# Patient Record
Sex: Female | Born: 1970 | Race: White | Hispanic: No | Smoking: Never smoker
Health system: Southern US, Community
[De-identification: ages and names within clinical notes are randomized; demographics above are authoritative.]

## PROBLEM LIST (undated history)

## (undated) DIAGNOSIS — N200 Calculus of kidney: Secondary | ICD-10-CM

## (undated) DIAGNOSIS — Z9889 Other specified postprocedural states: Secondary | ICD-10-CM

## (undated) DIAGNOSIS — T8859XA Other complications of anesthesia, initial encounter: Secondary | ICD-10-CM

## (undated) DIAGNOSIS — T4145XA Adverse effect of unspecified anesthetic, initial encounter: Secondary | ICD-10-CM

## (undated) DIAGNOSIS — R112 Nausea with vomiting, unspecified: Secondary | ICD-10-CM

## (undated) HISTORY — PX: DENTAL SURGERY: SHX609

---

## 1994-03-02 HISTORY — PX: TUBAL LIGATION: SHX77

## 2002-03-02 HISTORY — PX: OTHER SURGICAL HISTORY: SHX169

## 2015-04-08 ENCOUNTER — Ambulatory Visit
Admission: RE | Admit: 2015-04-08 | Discharge: 2015-04-08 | Disposition: A | Discharge status: Home / Self Care | Payer: Managed Care, Other (non HMO) | Source: Ambulatory Visit | Attending: Cardiology | Admitting: Cardiology

## 2015-04-08 ENCOUNTER — Other Ambulatory Visit: Payer: Self-pay | Admitting: Internal Medicine

## 2015-04-08 ENCOUNTER — Ambulatory Visit
Admission: RE | Admit: 2015-04-08 | Discharge: 2015-04-08 | Disposition: A | Discharge status: Home / Self Care | Payer: Managed Care, Other (non HMO) | Source: Ambulatory Visit | Attending: Internal Medicine | Admitting: Internal Medicine

## 2015-04-08 DIAGNOSIS — R05 Cough: Secondary | ICD-10-CM

## 2015-04-08 DIAGNOSIS — J40 Bronchitis, not specified as acute or chronic: Secondary | ICD-10-CM

## 2015-04-08 DIAGNOSIS — R059 Cough, unspecified: Secondary | ICD-10-CM

## 2015-05-16 ENCOUNTER — Encounter: Payer: Self-pay | Admitting: Family Medicine

## 2015-05-16 ENCOUNTER — Ambulatory Visit (INDEPENDENT_AMBULATORY_CARE_PROVIDER_SITE_OTHER): Payer: Managed Care, Other (non HMO) | Admitting: Family Medicine

## 2015-05-16 VITALS — BP 122/72 | HR 74 | Temp 98.4°F | Ht 63.5 in | Wt 170.6 lb

## 2015-05-16 DIAGNOSIS — Z23 Encounter for immunization: Secondary | ICD-10-CM

## 2015-05-16 DIAGNOSIS — Z0000 Encounter for general adult medical examination without abnormal findings: Secondary | ICD-10-CM | POA: Diagnosis not present

## 2015-05-16 NOTE — Progress Notes (Signed)
BP 122/72 mmHg  Pulse 74  Temp(Src) 98.4 F (36.9 C)  Ht 5' 3.5" (1.613 m)  Wt 170 lb 9.6 oz (77.384 kg)  BMI 29.74 kg/m2  SpO2 99%  LMP 04/23/2015 (Exact Date)   Subjective:    Patient ID: Candice Robinson, female    DOB: 09/19/1970, 45 y.o.   MRN: [id number]  HPI: Candice Robinson is a 45 y.o. female presenting on 05/16/2015 to establish care and for comprehensive medical examination. Current medical complaints include: none  She currently lives with: husband Menopausal Symptoms: no  Depression Screen done today and results listed below:  Depression screen [facility] 2/9 05/16/2015  Decreased Interest 0  Down, Depressed, Hopeless 0  PHQ - 2 Score 0    Past Medical History:  History reviewed. No pertinent past medical history.  Surgical History:  Past Surgical History  Procedure Laterality Date  . Tubal ligation  1996  . Tummy tuck  2004    Medications:  No current outpatient prescriptions on file prior to visit.   No current facility-administered medications on file prior to visit.    Allergies:  Allergies  Allergen Reactions  . Codeine Other (See Comments)    Hallucinations    Social History:  Social History   Social History  . Marital Status: Single    Spouse Name: N/A  . Number of Children: N/A  . Years of Education: N/A   Occupational History  . Not on file.   Social History Main Topics  . Smoking status: Never Smoker   . Smokeless tobacco: Never Used  . Alcohol Use: Yes     Comment: socially  . Drug Use: No  . Sexual Activity: Yes   Other Topics Concern  . Not on file   Social History Narrative  . No narrative on file   History  Smoking status  . Never Smoker   Smokeless tobacco  . Never Used   History  Alcohol Use  . Yes    Comment: socially    Family History:  Family History  Problem Relation Age of Onset  . Diabetes Mother   . Hyperlipidemia Mother   . Hypertension Mother   . Hyperlipidemia Father   . Hypertension Father    . Heart disease Father   . Lupus Sister   . Diabetes Sister   . Hypertension Sister     Past medical history, surgical history, medications, allergies, family history and social history reviewed with patient today and changes made to appropriate areas of the chart.   Review of Systems  Constitutional: Negative.   HENT: Negative.   Eyes: Negative.        Needs to get eyes checked  Respiratory: Negative.   Cardiovascular: Positive for leg swelling (only with being on them for a long time). Negative for chest pain, palpitations, orthopnea, claudication and PND.  Gastrointestinal: Positive for heartburn (goes away with OTC medicine, food dependent). Negative for nausea, vomiting, abdominal pain, diarrhea, constipation, blood in stool and melena.  Genitourinary: Negative.        Periods are pretty irregular  Musculoskeletal: Negative.   Skin: Negative.   Neurological: Negative.   Endo/Heme/Allergies: Negative.   Psychiatric/Behavioral: Negative.        Some mood swings around her period     All other ROS negative except what is listed above and in the HPI.      Objective:    BP 122/72 mmHg  Pulse 74  Temp(Src) 98.4 F (36.9 C)  Ht  5' 3.5" (1.613 m)  Wt 170 lb 9.6 oz (77.384 kg)  BMI 29.74 kg/m2  SpO2 99%  LMP 04/23/2015 (Exact Date)  Wt Readings from Last 3 Encounters:  05/16/15 170 lb 9.6 oz (77.384 kg)    Physical Exam  Constitutional: She is oriented to person, place, and time. She appears well-developed and well-nourished. No distress.  HENT:  Head: Normocephalic and atraumatic.  Right Ear: Hearing, tympanic membrane, external ear and ear canal normal.  Left Ear: Hearing, tympanic membrane, external ear and ear canal normal.  Nose: Nose normal.  Mouth/Throat: Uvula is midline, oropharynx is clear and moist and mucous membranes are normal. No oropharyngeal exudate.  Eyes: Conjunctivae, EOM and lids are normal. Pupils are equal, round, and reactive to light. Right  eye exhibits no discharge. Left eye exhibits no discharge. No scleral icterus.  Neck: Normal range of motion. Neck supple. No JVD present. No tracheal deviation present. No thyromegaly present.  Cardiovascular: Normal rate, regular rhythm, normal heart sounds and intact distal pulses.  Exam reveals no gallop and no friction rub.   No murmur heard. Pulmonary/Chest: Effort normal and breath sounds normal. No stridor. No respiratory distress. She has no wheezes. She has no rales. She exhibits no tenderness.  Abdominal: Soft. Bowel sounds are normal. She exhibits no distension and no mass. There is no tenderness. There is no rebound and no guarding.  Genitourinary:  Breast and Pelvic deferred- done at GYN   Musculoskeletal: Normal range of motion. She exhibits no edema or tenderness.  Lymphadenopathy:    She has no cervical adenopathy.  Neurological: She is alert and oriented to person, place, and time. She has normal reflexes. She displays normal reflexes. No cranial nerve deficit. She exhibits normal muscle tone. Coordination normal.  Skin: Skin is warm, dry and intact. No rash noted. She is not diaphoretic. No erythema. No pallor.  Psychiatric: She has a normal mood and affect. Her speech is normal and behavior is normal. Judgment and thought content normal. Cognition and memory are normal.  Nursing note and vitals reviewed.   No results found for this or any previous visit.    Assessment & Plan:   Problem List Items Addressed This Visit    None    Visit Diagnoses    Routine general medical examination at a health care facility    -  Primary    Up to date on vaccines, pap due- sees GYN. Screening labs checked today. Work on diet and exercise. Continue to montior.     Relevant Orders    CBC with Differential/Platelet    Comprehensive metabolic panel    Lipid Panel w/o Chol/HDL Ratio    TSH    UA/M w/rflx Culture, Routine    Immunization due        Tdap given today.    Relevant  Orders    Tdap vaccine greater than or equal to 7yo IM (Completed)        Follow up plan: Return in about 1 year (around 05/15/2016) for Physical, [facility]   LABORATORY TESTING:  - Pap smear: done elsewhere  IMMUNIZATIONS:   - Tdap: Tetanus vaccination status reviewed: Given today. - Influenza: Refused - Pneumovax: Not applicable  PATIENT COUNSELING:   Advised to take 1 mg of folate supplement per day if capable of pregnancy.   Sexuality: Discussed sexually transmitted diseases, partner selection, use of condoms, avoidance of unintended pregnancy  and contraceptive alternatives.   Advised to avoid  cigarette smoking.  I discussed with the patient that most people either abstain from alcohol or drink within safe limits (<=14/week and <=4 drinks/occasion for males, <=7/weeks and <= 3 drinks/occasion for females) and that the risk for alcohol disorders and other health effects rises proportionally with the number of drinks per week and how often a drinker exceeds daily limits.  Discussed cessation/primary prevention of drug use and availability of treatment for abuse.   Diet: Encouraged to adjust caloric intake to maintain  or achieve ideal body weight, to reduce intake of dietary saturated fat and total fat, to limit sodium intake by avoiding high sodium foods and not adding table salt, and to maintain adequate dietary potassium and calcium preferably from fresh fruits, vegetables, and low-fat dairy products.    stressed the importance of regular exercise  Injury prevention: Discussed safety belts, safety helmets, smoke detector, smoking near bedding or upholstery.   Dental health: Discussed importance of regular tooth brushing, flossing, and dental visits.    NEXT PREVENTATIVE PHYSICAL DUE IN 1 YEAR. Return in about 1 year (around 05/15/2016) for Physical, records release for [place] [facility] please.

## 2015-05-16 NOTE — Patient Instructions (Addendum)
Health Maintenance, Female Adopting a healthy lifestyle and getting preventive care can go a long way to promote health and wellness. Talk with your health care provider about what schedule of regular examinations is right for you. This is a good chance for you to check in with your provider about disease prevention and staying healthy. In between checkups, there are plenty of things you can do on your own. Experts have done a lot of research about which lifestyle changes and preventive measures are most likely to keep you healthy. Ask your health care provider for more information. WEIGHT AND DIET  Eat a healthy diet  Be sure to include plenty of vegetables, fruits, low-fat dairy products, and lean protein.  Do not eat a lot of foods high in solid fats, added sugars, or salt.  Get regular exercise. This is one of the most important things you can do for your health.  Most adults should exercise for at least 150 minutes each week. The exercise should increase your heart rate and make you sweat (moderate-intensity exercise).  Most adults should also do strengthening exercises at least twice a week. This is in addition to the moderate-intensity exercise.  Maintain a healthy weight  Body mass index (BMI) is a measurement that can be used to identify possible weight problems. It estimates body fat based on height and weight. Your health care provider can help determine your BMI and help you achieve or maintain a healthy weight.  For females 20 years of age and older:   A BMI below 18.5 is considered underweight.  A BMI of 18.5 to 24.9 is normal.  A BMI of 25 to 29.9 is considered overweight.  A BMI of 30 and above is considered obese.  Watch levels of cholesterol and blood lipids  You should start having your blood tested for lipids and cholesterol at 45 years of age, then have this test every 5 years.  You may need to have your cholesterol levels checked more often if:  Your lipid  or cholesterol levels are high.  You are older than 45 years of age.  You are at high risk for heart disease.  CANCER SCREENING   Lung Cancer  Lung cancer screening is recommended for adults 55-80 years old who are at high risk for lung cancer because of a history of smoking.  A yearly low-dose CT scan of the lungs is recommended for people who:  Currently smoke.  Have quit within the past 15 years.  Have at least a 30-pack-year history of smoking. A pack year is smoking an average of one pack of cigarettes a day for 1 year.  Yearly screening should continue until it has been 15 years since you quit.  Yearly screening should stop if you develop a health problem that would prevent you from having lung cancer treatment.  Breast Cancer  Practice breast self-awareness. This means understanding how your breasts normally appear and feel.  It also means doing regular breast self-exams. Let your health care provider know about any changes, no matter how small.  If you are in your 20s or 30s, you should have a clinical breast exam (CBE) by a health care provider every 1-3 years as part of a regular health exam.  If you are 40 or older, have a CBE every year. Also consider having a breast X-ray (mammogram) every year.  If you have a family history of breast cancer, talk to your health care provider about genetic screening.  If you   are at high risk for breast cancer, talk to your health care provider about having an MRI and a mammogram every year.  Breast cancer gene (BRCA) assessment is recommended for women who have family members with BRCA-related cancers. BRCA-related cancers include:  Breast.  Ovarian.  Tubal.  Peritoneal cancers.  Results of the assessment will determine the need for genetic counseling and BRCA1 and BRCA2 testing. Cervical Cancer Your health care provider may recommend that you be screened regularly for cancer of the pelvic organs (ovaries, uterus, and  vagina). This screening involves a pelvic examination, including checking for microscopic changes to the surface of your cervix (Pap test). You may be encouraged to have this screening done every 3 years, beginning at age 21.  For women ages 30-65, health care providers may recommend pelvic exams and Pap testing every 3 years, or they may recommend the Pap and pelvic exam, combined with testing for human papilloma virus (HPV), every 5 years. Some types of HPV increase your risk of cervical cancer. Testing for HPV may also be done on women of any age with unclear Pap test results.  Other health care providers may not recommend any screening for nonpregnant women who are considered low risk for pelvic cancer and who do not have symptoms. Ask your health care provider if a screening pelvic exam is right for you.  If you have had past treatment for cervical cancer or a condition that could lead to cancer, you need Pap tests and screening for cancer for at least 20 years after your treatment. If Pap tests have been discontinued, your risk factors (such as having a new sexual partner) need to be reassessed to determine if screening should resume. Some women have medical problems that increase the chance of getting cervical cancer. In these cases, your health care provider may recommend more frequent screening and Pap tests. Colorectal Cancer  This type of cancer can be detected and often prevented.  Routine colorectal cancer screening usually begins at 45 years of age and continues through 45 years of age.  Your health care provider may recommend screening at an earlier age if you have risk factors for colon cancer.  Your health care provider may also recommend using home test kits to check for hidden blood in the stool.  A small camera at the end of a tube can be used to examine your colon directly (sigmoidoscopy or colonoscopy). This is done to check for the earliest forms of colorectal  cancer.  Routine screening usually begins at age 50.  Direct examination of the colon should be repeated every 5-10 years through 45 years of age. However, you may need to be screened more often if early forms of precancerous polyps or small growths are found. Skin Cancer  Check your skin from head to toe regularly.  Tell your health care provider about any new moles or changes in moles, especially if there is a change in a mole's shape or color.  Also tell your health care provider if you have a mole that is larger than the size of a pencil eraser.  Always use sunscreen. Apply sunscreen liberally and repeatedly throughout the day.  Protect yourself by wearing long sleeves, pants, a wide-brimmed hat, and sunglasses whenever you are outside. HEART DISEASE, DIABETES, AND HIGH BLOOD PRESSURE   High blood pressure causes heart disease and increases the risk of stroke. High blood pressure is more likely to develop in:  People who have blood pressure in the high end   of the normal range (130-139/85-89 mm Hg).  People who are overweight or obese.  People who are African American.  If you are 38-23 years of age, have your blood pressure checked every 3-5 years. If you are 61 years of age or older, have your blood pressure checked every year. You should have your blood pressure measured twice--once when you are at a hospital or clinic, and once when you are not at a hospital or clinic. Record the average of the two measurements. To check your blood pressure when you are not at a hospital or clinic, you can use:  An automated blood pressure machine at a pharmacy.  A home blood pressure monitor.  If you are between 45 years and 39 years old, ask your health care provider if you should take aspirin to prevent strokes.  Have regular diabetes screenings. This involves taking a blood sample to check your fasting blood sugar level.  If you are at a normal weight and have a low risk for diabetes,  have this test once every three years after 45 years of age.  If you are overweight and have a high risk for diabetes, consider being tested at a younger age or more often. PREVENTING INFECTION  Hepatitis B  If you have a higher risk for hepatitis B, you should be screened for this virus. You are considered at high risk for hepatitis B if:  You were born in a country where hepatitis B is common. Ask your health care provider which countries are considered high risk.  Your parents were born in a high-risk country, and you have not been immunized against hepatitis B (hepatitis B vaccine).  You have HIV or AIDS.  You use needles to inject street drugs.  You live with someone who has hepatitis B.  You have had sex with someone who has hepatitis B.  You get hemodialysis treatment.  You take certain medicines for conditions, including cancer, organ transplantation, and autoimmune conditions. Hepatitis C  Blood testing is recommended for:  Everyone born from [birth year] through 1965.  Anyone with known risk factors for hepatitis C. Sexually transmitted infections (STIs)  You should be screened for sexually transmitted infections (STIs) including gonorrhea and chlamydia if:  You are sexually active and are younger than 45 years of age.  You are older than 45 years of age and your health care provider tells you that you are at risk for this type of infection.  Your sexual activity has changed since you were last screened and you are at an increased risk for chlamydia or gonorrhea. Ask your health care provider if you are at risk.  If you do not have HIV, but are at risk, it may be recommended that you take a prescription medicine daily to prevent HIV infection. This is called pre-exposure prophylaxis (PrEP). You are considered at risk if:  You are sexually active and do not regularly use condoms or know the HIV status of your partner(s).  You take drugs by injection.  You are sexually  active with a partner who has HIV. Talk with your health care provider about whether you are at high risk of being infected with HIV. If you choose to begin PrEP, you should first be tested for HIV. You should then be tested every 3 months for as long as you are taking PrEP.  PREGNANCY   If you are premenopausal and you may become pregnant, ask your health care provider about preconception counseling.  If you may  become pregnant, take 400 to 800 micrograms (mcg) of folic acid every day.  If you want to prevent pregnancy, talk to your health care provider about birth control (contraception). OSTEOPOROSIS AND MENOPAUSE   Osteoporosis is a disease in which the bones lose minerals and strength with aging. This can result in serious bone fractures. Your risk for osteoporosis can be identified using a bone density scan.  If you are 41 years of age or older, or if you are at risk for osteoporosis and fractures, ask your health care provider if you should be screened.  Ask your health care provider whether you should take a calcium or vitamin D supplement to lower your risk for osteoporosis.  Menopause may have certain physical symptoms and risks.  Hormone replacement therapy may reduce some of these symptoms and risks. Talk to your health care provider about whether hormone replacement therapy is right for you.  HOME CARE INSTRUCTIONS   Schedule regular health, dental, and eye exams.  Stay current with your immunizations.   Do not use any tobacco products including cigarettes, chewing tobacco, or electronic cigarettes.  If you are pregnant, do not drink alcohol.  If you are breastfeeding, limit how much and how often you drink alcohol.  Limit alcohol intake to no more than 1 drink per day for nonpregnant women. One drink equals 12 ounces of beer, 5 ounces of wine, or 1 ounces of hard liquor.  Do not use street drugs.  Do not share needles.  Ask your health care provider for help if  you need support or information about quitting drugs.  Tell your health care provider if you often feel depressed.  Tell your health care provider if you have ever been abused or do not feel safe at home.   This information is not intended to replace advice given to you by your health care provider. Make sure you discuss any questions you have with your health care provider.   Document Released: 09/01/2010 Document Revised: 03/09/2014 Document Reviewed: 01/18/2013 Elsevier Interactive Patient Education 2016 [employer]. Tdap Vaccine (Tetanus, Diphtheria and Pertussis): What You Need to Know 1. Why get vaccinated? Tetanus, diphtheria and pertussis are very serious diseases. Tdap vaccine can protect [place] from these diseases. And, Tdap vaccine given to pregnant women can protect newborn babies against pertussis. TETANUS (Lockjaw) is rare in the [place] today. It causes painful muscle tightening and stiffness, usually all over the body.  It can lead to tightening of muscles in the head and neck so you can't open your mouth, swallow, or sometimes even breathe. Tetanus kills about 1 out of 10 people who are infected even after receiving the best medical care. DIPHTHERIA is also rare in the [place] today. It can cause a thick coating to form in the back of the throat.  It can lead to breathing problems, heart failure, paralysis, and death. PERTUSSIS (Whooping Cough) causes severe coughing spells, which can cause difficulty breathing, vomiting and disturbed sleep.  It can also lead to weight loss, incontinence, and rib fractures. Up to 2 in 100 adolescents and 5 in 100 adults with pertussis are hospitalized or have complications, which could include pneumonia or death. These diseases are caused by bacteria. Diphtheria and pertussis are spread from person to person through secretions from coughing or sneezing. Tetanus enters the body through cuts, scratches, or wounds. Before vaccines, as  many as 200,000 cases of diphtheria, 200,000 cases of pertussis, and hundreds of cases of tetanus, were reported in  the [place] each year. Since vaccination began, reports of cases for tetanus and diphtheria have dropped by about 99% and for pertussis by about 80%. 2. Tdap vaccine Tdap vaccine can protect adolescents and adults from tetanus, diphtheria, and pertussis. One dose of Tdap is routinely given at age 3 or 48. People who did not get Tdap at that age should get it as soon as possible. Tdap is especially important for healthcare professionals and anyone having close contact with a baby younger than 12 months. Pregnant women should get a dose of Tdap during every pregnancy, to protect the newborn from pertussis. Infants are most at risk for severe, life-threatening complications from pertussis. Another vaccine, called Td, protects against tetanus and diphtheria, but not pertussis. A Td booster should be given every 10 years. Tdap may be given as one of these boosters if you have never gotten Tdap before. Tdap may also be given after a severe cut or burn to prevent tetanus infection. Your doctor or the person giving you the vaccine can give you more information. Tdap may safely be given at the same time as other vaccines. 3. Some people should not get this vaccine  A person who has ever had a life-threatening allergic reaction after a previous dose of any diphtheria, tetanus or pertussis containing vaccine, OR has a severe allergy to any part of this vaccine, should not get Tdap vaccine. Tell the person giving the vaccine about any severe allergies.  Anyone who had coma or long repeated seizures within 7 days after a childhood dose of DTP or DTaP, or a previous dose of Tdap, should not get Tdap, unless a cause other than the vaccine was found. They can still get Td.  Talk to your doctor if you:  have seizures or another nervous system problem,  had severe pain or swelling after any  vaccine containing diphtheria, tetanus or pertussis,  ever had a condition called Guillain-Barr Syndrome (GBS),  aren't feeling well on the day the shot is scheduled. 4. Risks With any medicine, including vaccines, there is a chance of side effects. These are usually mild and go away on their own. Serious reactions are also possible but are rare. Most people who get Tdap vaccine do not have any problems with it. Mild problems following Tdap (Did not interfere with activities)  Pain where the shot was given (about 3 in 4 adolescents or 2 in 3 adults)  Redness or swelling where the shot was given (about 1 person in 5)  Mild fever of at least 100.79F (up to about 1 in 25 adolescents or 1 in 100 adults)  Headache (about 3 or 4 people in 10)  Tiredness (about 1 person in 3 or 4)  Nausea, vomiting, diarrhea, stomach ache (up to 1 in 4 adolescents or 1 in 10 adults)  Chills, sore joints (about 1 person in 10)  Body aches (about 1 person in 3 or 4)  Rash, swollen glands (uncommon) Moderate problems following Tdap (Interfered with activities, but did not require medical attention)  Pain where the shot was given (up to 1 in 5 or 6)  Redness or swelling where the shot was given (up to about 1 in 16 adolescents or 1 in 12 adults)  Fever over 102F (about 1 in 100 adolescents or 1 in 250 adults)  Headache (about 1 in 7 adolescents or 1 in 10 adults)  Nausea, vomiting, diarrhea, stomach ache (up to 1 or 3 people in 100)  Swelling of the  entire arm where the shot was given (up to about 1 in 500). Severe problems following Tdap (Unable to perform usual activities; required medical attention)  Swelling, severe pain, bleeding and redness in the arm where the shot was given (rare). Problems that could happen after any vaccine:  People sometimes faint after a medical procedure, including vaccination. Sitting or lying down for about 15 minutes can help prevent fainting, and injuries  caused by a fall. Tell your doctor if you feel dizzy, or have vision changes or ringing in the ears.  Some people get severe pain in the shoulder and have difficulty moving the arm where a shot was given. This happens very rarely.  Any medication can cause a severe allergic reaction. Such reactions from a vaccine are very rare, estimated at fewer than 1 in a million doses, and would happen within a few minutes to a few hours after the vaccination. As with any medicine, there is a very remote chance of a vaccine causing a serious injury or death. The safety of vaccines is always being monitored. For more information, visit: www.cdc.gov/vaccinesafety/ 5. What if there is a serious problem? What should I look for?  Look for anything that concerns you, such as signs of a severe allergic reaction, very high fever, or unusual behavior.  Signs of a severe allergic reaction can include hives, swelling of the face and throat, difficulty breathing, a fast heartbeat, dizziness, and weakness. These would usually start a few minutes to a few hours after the vaccination. What should I do?  If you think it is a severe allergic reaction or other emergency that can't wait, call 9-1-1 or get the person to the nearest hospital. Otherwise, call your doctor.  Afterward, the reaction should be reported to the Vaccine Adverse Event Reporting System (VAERS). Your doctor might file this report, or you can do it yourself through the VAERS web site at www.vaers.hhs.gov, or by calling 1-800-822-7967. VAERS does not give medical advice.  6. The National Vaccine Injury Compensation Program The National Vaccine Injury Compensation Program (VICP) is a federal program that was created to compensate people who may have been injured by certain vaccines. Persons who believe they may have been injured by a vaccine can learn about the program and about filing a claim by calling 1-800-338-2382 or visiting the VICP website at  www.hrsa.gov/vaccinecompensation. There is a time limit to file a claim for compensation. 7. How can I learn more?  Ask your doctor. He or she can give you the vaccine package insert or suggest other sources of information.  Call your local or state health department.  Contact the Centers for Disease Control and Prevention (CDC):  Call 1-800-232-4636 (1-800-CDC-INFO) or  Visit CDC's website at www.cdc.gov/vaccines CDC Tdap Vaccine VIS (04/25/13)   This information is not intended to replace advice given to you by your health care provider. Make sure you discuss any questions you have with your health care provider.   Document Released: 08/18/2011 Document Revised: 03/09/2014 Document Reviewed: 05/31/2013 Elsevier Interactive Patient Education 2016 Elsevier Inc[employer]  

## 2015-05-17 ENCOUNTER — Telehealth: Payer: Self-pay | Admitting: Family Medicine

## 2015-05-17 DIAGNOSIS — R7989 Other specified abnormal findings of blood chemistry: Secondary | ICD-10-CM | POA: Insufficient documentation

## 2015-05-17 LAB — COMPREHENSIVE METABOLIC PANEL
ALBUMIN: 3.8 g/dL (ref 3.5–5.5)
ALT: 12 IU/L (ref 0–32)
AST: 14 IU/L (ref 0–40)
Albumin/Globulin Ratio: 1.2 (ref 1.2–2.2)
Alkaline Phosphatase: 84 IU/L (ref 39–117)
BILIRUBIN TOTAL: 0.2 mg/dL (ref 0.0–1.2)
BUN / CREAT RATIO: 12 (ref 9–23)
BUN: 10 mg/dL (ref 6–24)
CALCIUM: 9 mg/dL (ref 8.7–10.2)
CHLORIDE: 104 mmol/L (ref 96–106)
CO2: 22 mmol/L (ref 18–29)
Creatinine, Ser: 0.84 mg/dL (ref 0.57–1.00)
GFR, EST AFRICAN AMERICAN: 97 mL/min/{1.73_m2} (ref >59)
GFR, EST NON AFRICAN AMERICAN: 84 mL/min/{1.73_m2} (ref >59)
GLUCOSE: 76 mg/dL (ref 65–99)
Globulin, Total: 3.3 g/dL (ref 1.5–4.5)
POTASSIUM: 4.6 mmol/L (ref 3.5–5.2)
Sodium: 139 mmol/L (ref 134–144)
TOTAL PROTEIN: 7.1 g/dL (ref 6.0–8.5)

## 2015-05-17 LAB — CBC WITH DIFFERENTIAL/PLATELET
BASOS: 1 %
Basophils Absolute: 0.1 10*3/uL (ref 0.0–0.2)
EOS (ABSOLUTE): 0.3 10*3/uL (ref 0.0–0.4)
Eos: 4 %
HEMOGLOBIN: 12.2 g/dL (ref 11.1–15.9)
Hematocrit: 38.4 % (ref 34.0–46.6)
IMMATURE GRANS (ABS): 0 10*3/uL (ref 0.0–0.1)
IMMATURE GRANULOCYTES: 0 %
LYMPHS: 37 %
Lymphocytes Absolute: 3.1 10*3/uL (ref 0.7–3.1)
MCH: 24.9 pg — AB (ref 26.6–33.0)
MCHC: 31.8 g/dL (ref 31.5–35.7)
MCV: 79 fL (ref 79–97)
MONOCYTES: 9 %
Monocytes Absolute: 0.8 10*3/uL (ref 0.1–0.9)
NEUTROS ABS: 4.2 10*3/uL (ref 1.4–7.0)
NEUTROS PCT: 49 %
Platelets: 420 10*3/uL — ABNORMAL HIGH (ref 150–379)
RBC: 4.89 x10E6/uL (ref 3.77–5.28)
RDW: 15.6 % — ABNORMAL HIGH (ref 12.3–15.4)
WBC: 8.4 10*3/uL (ref 3.4–10.8)

## 2015-05-17 LAB — LIPID PANEL W/O CHOL/HDL RATIO
CHOLESTEROL TOTAL: 172 mg/dL (ref 100–199)
HDL: 29 mg/dL — AB (ref >39)
LDL Calculated: 91 mg/dL (ref 0–99)
Triglycerides: 259 mg/dL — ABNORMAL HIGH (ref 0–149)
VLDL Cholesterol Cal: 52 mg/dL — ABNORMAL HIGH (ref 5–40)

## 2015-05-17 LAB — TSH: TSH: 4.51 u[IU]/mL — ABNORMAL HIGH (ref 0.450–4.500)

## 2015-05-17 NOTE — Telephone Encounter (Signed)
Please let her know that her results came back normal except her thyroid is very slightly off. I'd like to recheck it in 1 month just with a lab visit to make sure that she doesn't need thyroid medicine. Everything else looks great.

## 2015-05-17 NOTE — Telephone Encounter (Signed)
Patient notified

## 2015-05-17 NOTE — Telephone Encounter (Signed)
Tried to call patient to give results and let her know that we need her waist measurements for her health form. No answer, unable to leave a message.

## 2015-05-18 LAB — UA/M W/RFLX CULTURE, ROUTINE
BILIRUBIN UA: NEGATIVE
GLUCOSE, UA: NEGATIVE
KETONES UA: NEGATIVE
NITRITE UA: NEGATIVE
SPEC GRAV UA: 1.02 (ref 1.005–1.030)
UUROB: 0.2 mg/dL (ref 0.2–1.0)
pH, UA: 6 (ref 5.0–7.5)

## 2015-05-18 LAB — MICROSCOPIC EXAMINATION

## 2015-05-18 LAB — URINE CULTURE, REFLEX: ORGANISM ID, BACTERIA: NO GROWTH

## 2015-05-27 ENCOUNTER — Telehealth: Payer: Self-pay | Admitting: Family Medicine

## 2015-05-27 NOTE — Telephone Encounter (Signed)
Pt called with info that was needed for form... waist measurement 37

## 2015-06-27 ENCOUNTER — Other Ambulatory Visit: Payer: Managed Care, Other (non HMO)

## 2015-06-27 DIAGNOSIS — R7989 Other specified abnormal findings of blood chemistry: Secondary | ICD-10-CM

## 2015-06-28 ENCOUNTER — Other Ambulatory Visit: Payer: Self-pay

## 2015-06-28 LAB — THYROID PANEL WITH TSH
Free Thyroxine Index: 2 (ref 1.2–4.9)
T3 Uptake Ratio: 25 % (ref 24–39)
T4, Total: 8 ug/dL (ref 4.5–12.0)
TSH: 4.81 u[IU]/mL — AB (ref 0.450–4.500)

## 2015-07-01 ENCOUNTER — Telehealth: Payer: Self-pay | Admitting: Family Medicine

## 2015-07-01 DIAGNOSIS — R7989 Other specified abnormal findings of blood chemistry: Secondary | ICD-10-CM

## 2015-07-01 NOTE — Telephone Encounter (Addendum)
Called to speak to Candice Robinson about her results. Unable to leave a message. 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 This is up to her. Will call back later and see if we can get her.   Patient called back. Would like to hold on medication at this time as is having no symptoms. Will recheck in 6 months. Will call for appointment closer to.

## 2016-02-19 ENCOUNTER — Telehealth: Payer: Self-pay | Admitting: Family Medicine

## 2016-02-19 NOTE — Telephone Encounter (Signed)
Patient was supposed to come in Oct-Nov to have her labs for her thyroid checked, she would like to do so this week   Please advise.  Thanks Clydie BraunKaren

## 2016-02-19 NOTE — Telephone Encounter (Signed)
Called and left patient a VM letting her know that order was already in and that she could stop by to have the lab drawn.

## 2016-08-10 ENCOUNTER — Encounter: Payer: Self-pay | Admitting: Family Medicine

## 2016-08-10 ENCOUNTER — Ambulatory Visit (INDEPENDENT_AMBULATORY_CARE_PROVIDER_SITE_OTHER): Payer: Commercial Managed Care - PPO | Admitting: Family Medicine

## 2016-08-10 VITALS — BP 126/85 | HR 58 | Temp 97.5°F | Ht 64.0 in | Wt 166.5 lb

## 2016-08-10 DIAGNOSIS — Z0000 Encounter for general adult medical examination without abnormal findings: Secondary | ICD-10-CM

## 2016-08-10 DIAGNOSIS — Z1231 Encounter for screening mammogram for malignant neoplasm of breast: Secondary | ICD-10-CM

## 2016-08-10 DIAGNOSIS — Z1239 Encounter for other screening for malignant neoplasm of breast: Secondary | ICD-10-CM

## 2016-08-10 DIAGNOSIS — R946 Abnormal results of thyroid function studies: Secondary | ICD-10-CM | POA: Diagnosis not present

## 2016-08-10 DIAGNOSIS — Z91018 Allergy to other foods: Secondary | ICD-10-CM | POA: Diagnosis not present

## 2016-08-10 DIAGNOSIS — R7989 Other specified abnormal findings of blood chemistry: Secondary | ICD-10-CM

## 2016-08-10 NOTE — Assessment & Plan Note (Signed)
Rechecking levels today. Await results.  

## 2016-08-10 NOTE — Patient Instructions (Addendum)

## 2016-08-10 NOTE — Progress Notes (Signed)
BP 126/85 (BP Location: Left Arm, Patient Position: Sitting, Cuff Size: Normal)   Pulse (!) 58   Temp 97.5 F (36.4 C)   Ht 5\' 4"  (1.626 m)   Wt 166 lb 8 oz (75.5 kg)   LMP 08/05/2016   SpO2 99%   BMI 28.58 kg/m    Subjective:    Patient ID: Candice Robinson, female    DOB: Nov 22, 1970, 46 y.o.   MRN: [id number]  HPI: Candice Robinson is a 46 y.o. female presenting on 08/10/2016 for comprehensive medical examination. Current medical complaints include:   Recently did a hair test for allergies and was diagnosed with several including gluten and cream and yeast. She has just found this out and is trying to avoid them. She would like to see an allergist about this.   She currently lives with: husband Menopausal Symptoms: no  Depression Screen done today and results listed below:  [facility] 08/10/2016 05/16/2015  Decreased Interest 0 0  Down, Depressed, Hopeless 0 0  PHQ - 2 Score 0 0   Past Medical History:  History reviewed. No pertinent past medical history.  Surgical History:  Past Surgical History:  Procedure Laterality Date  . TUBAL LIGATION  1996  . tummy tuck  2004    Medications:  No current outpatient prescriptions on file prior to visit.   No current facility-administered medications on file prior to visit.     Allergies:  Allergies  Allergen Reactions  . Codeine Other (See Comments)    Hallucinations    Social History:  Social History   Social History  . Marital status: Single    Spouse name: N/A  . Number of children: N/A  . Years of education: N/A   Occupational History  . Not on file.   Social History Main Topics  . Smoking status: Never Smoker  . Smokeless tobacco: Never Used  . Alcohol use Yes     Comment: socially  . Drug use: No  . Sexual activity: Yes    Birth control/ protection: Surgical   Other Topics Concern  . Not on file   Social History Narrative  . No narrative on file   History  Smoking Status  .  Never Smoker  Smokeless Tobacco  . Never Used   History  Alcohol Use  . Yes    Comment: socially    Family History:  Family History  Problem Relation Age of Onset  . Diabetes Mother   . Hyperlipidemia Mother   . Hypertension Mother   . Hyperlipidemia Father   . Hypertension Father   . Heart disease Father   . Lupus Sister   . Diabetes Sister   . Hypertension Sister     Past medical history, surgical history, medications, allergies, family history and social history reviewed with patient today and changes made to appropriate areas of the chart.   Review of Systems  Constitutional: Negative.   HENT: Negative.   Eyes: Negative.   Respiratory: Negative.   Cardiovascular: Negative.   Gastrointestinal: Positive for abdominal pain (with eating gluten), constipation (with eating gluten) and diarrhea (with eating gluten). Negative for blood in stool, heartburn, melena, nausea and vomiting.  Genitourinary: Negative.   Musculoskeletal: Negative.   Skin: Negative.   Neurological: Negative.   Endo/Heme/Allergies: Negative.   Psychiatric/Behavioral: Negative.     All other ROS negative except what is listed above and in the HPI.      Objective:    BP 126/85 (  BP Location: Left Arm, Patient Position: Sitting, Cuff Size: Normal)   Pulse (!) 58   Temp 97.5 F (36.4 C)   Ht 5\' 4"  (1.626 m)   Wt 166 lb 8 oz (75.5 kg)   LMP 08/05/2016   SpO2 99%   BMI 28.58 kg/m   Wt Readings from Last 3 Encounters:  08/10/16 166 lb 8 oz (75.5 kg)  05/16/15 170 lb 9.6 oz (77.4 kg)    Physical Exam  Constitutional: She is oriented to person, place, and time. She appears well-developed and well-nourished. No distress.  HENT:  Head: Normocephalic and atraumatic.  Right Ear: Hearing, tympanic membrane, external ear and ear canal normal.  Left Ear: Hearing, tympanic membrane, external ear and ear canal normal.  Nose: Nose normal.  Mouth/Throat: Uvula is midline, oropharynx is clear and moist  and mucous membranes are normal. No oropharyngeal exudate.  Eyes: Conjunctivae, EOM and lids are normal. Pupils are equal, round, and reactive to light. Right eye exhibits no discharge. Left eye exhibits no discharge. No scleral icterus.  Neck: Normal range of motion. Neck supple. No JVD present. No tracheal deviation present. No thyromegaly present.  Cardiovascular: Normal rate, regular rhythm, normal heart sounds and intact distal pulses.  Exam reveals no gallop and no friction rub.   No murmur heard. Pulmonary/Chest: Effort normal and breath sounds normal. No stridor. No respiratory distress. She has no wheezes. She has no rales. She exhibits no tenderness.  Abdominal: Soft. Bowel sounds are normal. She exhibits no distension and no mass. There is no tenderness. There is no rebound and no guarding.  Genitourinary:  Genitourinary Comments: Breast and pelvic exams deferred- done at GYN  Musculoskeletal: Normal range of motion. She exhibits no edema, tenderness or deformity.  Lymphadenopathy:    She has no cervical adenopathy.  Neurological: She is alert and oriented to person, place, and time. She has normal reflexes. She displays normal reflexes. No cranial nerve deficit. She exhibits normal muscle tone. Coordination normal.  Skin: Skin is warm, dry and intact. No rash noted. She is not diaphoretic. No erythema. No pallor.  Psychiatric: She has a normal mood and affect. Her speech is normal and behavior is normal. Judgment and thought content normal. Cognition and memory are normal.  Nursing note reviewed.   Results for orders placed or performed in visit on 06/27/15  Thyroid Panel With TSH  Result Value Ref Range   TSH 4.810 (H) 0.450 - 4.500 uIU/mL   T4, Total 8.0 4.5 - 12.0 ug/dL   T3 Uptake Ratio 25 24 - 39 %   Free Thyroxine Index 2.0 1.2 - 4.9      Assessment & Plan:   Problem List Items Addressed This Visit      Other   Abnormal thyroid blood test    Rechecking levels today.  Await results.       Relevant Orders   Thyroid Panel With TSH    Other Visit Diagnoses    Routine general medical examination at a health care facility    -  Primary   Up to date on vaccines. Screening labs checked today. Pap done through GYN, mammogram ordered today. Continue diet and exercise. Call with concerns.    Relevant Orders   CBC with Differential/Platelet   Comprehensive metabolic panel   Lipid Panel w/o Chol/HDL Ratio   UA/M w/rflx Culture, Routine   Screening for breast cancer       Mammogram ordered today.   Relevant Orders   MM  DIGITAL SCREENING BILATERAL   Multiple food allergies       Per "hair test"- will refer to allergist for evaluation.    Relevant Orders   Ambulatory referral to Allergy       Follow up plan: Return in about 1 year (around 08/10/2017) for Physical.   LABORATORY TESTING:  - Pap smear: done elsewhere  IMMUNIZATIONS:   - Tdap: Tetanus vaccination status reviewed: last tetanus booster within 10 years. - Influenza: Postponed to flu season - Pneumovax: Not applicable  SCREENING: -Mammogram: Ordered today   PATIENT COUNSELING:   Advised to take 1 mg of folate supplement per day if capable of pregnancy.   Sexuality: Discussed sexually transmitted diseases, partner selection, use of condoms, avoidance of unintended pregnancy  and contraceptive alternatives.   Advised to avoid cigarette smoking.  I discussed with the patient that most people either abstain from alcohol or drink within safe limits (<=14/week and <=4 drinks/occasion for males, <=7/weeks and <= 3 drinks/occasion for females) and that the risk for alcohol disorders and other health effects rises proportionally with the number of drinks per week and how often a drinker exceeds daily limits.  Discussed cessation/primary prevention of drug use and availability of treatment for abuse.   Diet: Encouraged to adjust caloric intake to maintain  or achieve ideal body weight, to reduce  intake of dietary saturated fat and total fat, to limit sodium intake by avoiding high sodium foods and not adding table salt, and to maintain adequate dietary potassium and calcium preferably from fresh fruits, vegetables, and low-fat dairy products.    stressed the importance of regular exercise  Injury prevention: Discussed safety belts, safety helmets, smoke detector, smoking near bedding or upholstery.   Dental health: Discussed importance of regular tooth brushing, flossing, and dental visits.    NEXT PREVENTATIVE PHYSICAL DUE IN 1 YEAR. Return in about 1 year (around 08/10/2017) for Physical.

## 2016-08-11 ENCOUNTER — Encounter: Payer: Self-pay | Admitting: Family Medicine

## 2016-08-11 LAB — CBC WITH DIFFERENTIAL/PLATELET
BASOS ABS: 0 10*3/uL (ref 0.0–0.2)
Basos: 1 %
EOS (ABSOLUTE): 0.2 10*3/uL (ref 0.0–0.4)
Eos: 3 %
Hematocrit: 40.3 % (ref 34.0–46.6)
Hemoglobin: 12.6 g/dL (ref 11.1–15.9)
IMMATURE GRANS (ABS): 0 10*3/uL (ref 0.0–0.1)
Immature Granulocytes: 0 %
LYMPHS ABS: 1.9 10*3/uL (ref 0.7–3.1)
LYMPHS: 36 %
MCH: 25.2 pg — AB (ref 26.6–33.0)
MCHC: 31.3 g/dL — ABNORMAL LOW (ref 31.5–35.7)
MCV: 81 fL (ref 79–97)
MONOS ABS: 0.5 10*3/uL (ref 0.1–0.9)
Monocytes: 10 %
NEUTROS ABS: 2.7 10*3/uL (ref 1.4–7.0)
Neutrophils: 50 %
PLATELETS: 337 10*3/uL (ref 150–379)
RBC: 5 x10E6/uL (ref 3.77–5.28)
RDW: 14 % (ref 12.3–15.4)
WBC: 5.3 10*3/uL (ref 3.4–10.8)

## 2016-08-11 LAB — COMPREHENSIVE METABOLIC PANEL
A/G RATIO: 1.3 (ref 1.2–2.2)
ALK PHOS: 79 IU/L (ref 39–117)
ALT: 16 IU/L (ref 0–32)
AST: 16 IU/L (ref 0–40)
Albumin: 4.1 g/dL (ref 3.5–5.5)
BILIRUBIN TOTAL: 0.4 mg/dL (ref 0.0–1.2)
BUN/Creatinine Ratio: 11 (ref 9–23)
BUN: 10 mg/dL (ref 6–24)
CHLORIDE: 104 mmol/L (ref 96–106)
CO2: 23 mmol/L (ref 20–29)
Calcium: 9.1 mg/dL (ref 8.7–10.2)
Creatinine, Ser: 0.89 mg/dL (ref 0.57–1.00)
GFR calc Af Amer: 90 mL/min/{1.73_m2} (ref >59)
GFR calc non Af Amer: 78 mL/min/{1.73_m2} (ref >59)
GLUCOSE: 88 mg/dL (ref 65–99)
Globulin, Total: 3.1 g/dL (ref 1.5–4.5)
POTASSIUM: 4.4 mmol/L (ref 3.5–5.2)
Sodium: 141 mmol/L (ref 134–144)
Total Protein: 7.2 g/dL (ref 6.0–8.5)

## 2016-08-11 LAB — THYROID PANEL WITH TSH
FREE THYROXINE INDEX: 2 (ref 1.2–4.9)
T3 UPTAKE RATIO: 26 % (ref 24–39)
T4 TOTAL: 7.8 ug/dL (ref 4.5–12.0)
TSH: 3.88 u[IU]/mL (ref 0.450–4.500)

## 2016-08-11 LAB — LIPID PANEL W/O CHOL/HDL RATIO
CHOLESTEROL TOTAL: 162 mg/dL (ref 100–199)
HDL: 32 mg/dL — AB (ref >39)
LDL Calculated: 99 mg/dL (ref 0–99)
Triglycerides: 157 mg/dL — ABNORMAL HIGH (ref 0–149)
VLDL CHOLESTEROL CAL: 31 mg/dL (ref 5–40)

## 2016-08-12 LAB — UA/M W/RFLX CULTURE, ROUTINE
Bilirubin, UA: NEGATIVE
Glucose, UA: NEGATIVE
Ketones, UA: NEGATIVE
Nitrite, UA: NEGATIVE
PH UA: 6.5 (ref 5.0–7.5)
Specific Gravity, UA: 1.02 (ref 1.005–1.030)
Urobilinogen, Ur: 0.2 mg/dL (ref 0.2–1.0)

## 2016-08-12 LAB — URINE CULTURE, REFLEX

## 2016-08-12 LAB — MICROSCOPIC EXAMINATION: WBC, UA: >30 /hpf — AB (ref 0–?)

## 2016-09-16 ENCOUNTER — Ambulatory Visit
Admission: RE | Admit: 2016-09-16 | Discharge: 2016-09-16 | Disposition: A | Discharge status: Home / Self Care | Payer: Commercial Managed Care - PPO | Source: Ambulatory Visit | Attending: Family Medicine | Admitting: Family Medicine

## 2016-09-16 DIAGNOSIS — Z1231 Encounter for screening mammogram for malignant neoplasm of breast: Secondary | ICD-10-CM | POA: Diagnosis not present

## 2016-09-16 DIAGNOSIS — Z1239 Encounter for other screening for malignant neoplasm of breast: Secondary | ICD-10-CM

## 2016-09-21 ENCOUNTER — Other Ambulatory Visit: Payer: Self-pay | Admitting: Family Medicine

## 2016-09-21 DIAGNOSIS — R921 Mammographic calcification found on diagnostic imaging of breast: Secondary | ICD-10-CM

## 2016-09-21 DIAGNOSIS — R928 Other abnormal and inconclusive findings on diagnostic imaging of breast: Secondary | ICD-10-CM

## 2016-09-21 DIAGNOSIS — N6310 Unspecified lump in the right breast, unspecified quadrant: Secondary | ICD-10-CM

## 2016-09-24 ENCOUNTER — Encounter: Payer: Self-pay | Admitting: Family Medicine

## 2016-09-24 ENCOUNTER — Telehealth: Payer: Self-pay

## 2016-09-24 ENCOUNTER — Ambulatory Visit
Admission: RE | Admit: 2016-09-24 | Discharge: 2016-09-24 | Disposition: A | Discharge status: Home / Self Care | Payer: Commercial Managed Care - PPO | Source: Ambulatory Visit | Attending: Family Medicine | Admitting: Family Medicine

## 2016-09-24 ENCOUNTER — Other Ambulatory Visit: Payer: Self-pay | Admitting: Family Medicine

## 2016-09-24 DIAGNOSIS — N6310 Unspecified lump in the right breast, unspecified quadrant: Secondary | ICD-10-CM

## 2016-09-24 DIAGNOSIS — R921 Mammographic calcification found on diagnostic imaging of breast: Secondary | ICD-10-CM | POA: Diagnosis present

## 2016-09-24 DIAGNOSIS — R928 Other abnormal and inconclusive findings on diagnostic imaging of breast: Secondary | ICD-10-CM

## 2016-09-24 NOTE — Telephone Encounter (Signed)
All set!

## 2016-09-24 NOTE — Telephone Encounter (Signed)
New order for diagnostic mammo, they will change the order and just need it to be signed off.

## 2017-02-04 ENCOUNTER — Ambulatory Visit: Payer: Commercial Managed Care - PPO | Admitting: Family Medicine

## 2017-02-04 ENCOUNTER — Encounter: Payer: Self-pay | Admitting: Family Medicine

## 2017-02-04 VITALS — BP 133/76 | HR 74 | Temp 97.5°F | Wt 170.4 lb

## 2017-02-04 DIAGNOSIS — M549 Dorsalgia, unspecified: Secondary | ICD-10-CM | POA: Diagnosis not present

## 2017-02-04 DIAGNOSIS — M9908 Segmental and somatic dysfunction of rib cage: Secondary | ICD-10-CM | POA: Diagnosis not present

## 2017-02-04 MED ORDER — CYCLOBENZAPRINE HCL 10 MG PO TABS: 10.0000 mg | ORAL_TABLET | Freq: Every day | ORAL | 0 refills | Status: DC

## 2017-02-04 MED ORDER — NAPROXEN 500 MG PO TABS: 500.0000 mg | ORAL_TABLET | Freq: Two times a day (BID) | ORAL | 0 refills | Status: DC

## 2017-02-04 NOTE — Progress Notes (Signed)
BP 133/76   Pulse 74   Temp (!) 97.5 F (36.4 C) (Oral)   Wt 170 lb 6.4 oz (77.3 kg)   SpO2 100%   BMI 29.25 kg/m    Subjective:    Patient ID: Candice Robinson Chmiel, female    DOB: [birth year], 46 y.o.   MRN: [id number]  HPI: Candice Robinson Isidro is a 46 y.o. female  Chief Complaint  Patient presents with  . Back Pain    pt states she has had upper left back pain for about 2 weeks    BACK PAIN Duration: 2 weeks Mechanism of injury: no trauma Location: Left and upper back Onset: sudden Severity: severe Quality: 90% its a dull ache and then 10% it'Robinson a sudden sharp pain Frequency: intermittent, a couple of times a day Radiation: none Aggravating factors: movement, walking and bending Alleviating factors: NSAIDs Status: stable Treatments attempted: rest, ice, heat and ibuprofen  Relief with NSAIDs?: mild Nighttime pain:  no Paresthesias / decreased sensation:  no Bowel / bladder incontinence:  no Fevers:  no Dysuria / urinary frequency:  no  Relevant past medical, surgical, family and social history reviewed and updated as indicated. Interim medical history since our last visit reviewed. Allergies and medications reviewed and updated.  Review of Systems  Constitutional: Negative.   Respiratory: Negative.   Cardiovascular: Negative.   Musculoskeletal: Positive for back pain and myalgias. Negative for arthralgias, gait problem, joint swelling, neck pain and neck stiffness.  Neurological: Negative.   Psychiatric/Behavioral: Negative.     Per HPI unless specifically indicated above     Objective:    BP 133/76   Pulse 74   Temp (!) 97.5 F (36.4 C) (Oral)   Wt 170 lb 6.4 oz (77.3 kg)   SpO2 100%   BMI 29.25 kg/m   Wt Readings from Last 3 Encounters:  02/04/17 170 lb 6.4 oz (77.3 kg)  08/10/16 166 lb 8 oz (75.5 kg)  05/16/15 170 lb 9.6 oz (77.4 kg)    Physical Exam  Constitutional: She is oriented to person, place, and time. She appears well-developed and  well-nourished. No distress.  HENT:  Head: Normocephalic and atraumatic.  Right Ear: Hearing normal.  Left Ear: Hearing normal.  Nose: Nose normal.  Eyes: Conjunctivae and lids are normal. Right eye exhibits no discharge. Left eye exhibits no discharge. No scleral icterus.  Cardiovascular: Normal rate, regular rhythm, normal heart sounds and intact distal pulses. Exam reveals no gallop and no friction rub.  No murmur heard. Pulmonary/Chest: Effort normal and breath sounds normal. No respiratory distress. She has no wheezes. She has no rales. She exhibits no tenderness.  Neurological: She is alert and oriented to person, place, and time.  Skin: Skin is intact. No rash noted. She is not diaphoretic.  Psychiatric: She has a normal mood and affect. Her speech is normal and behavior is normal. Judgment and thought content normal. Cognition and memory are normal.  Nursing note and vitals reviewed. Back Exam:    Inspection:  Normal spinal curvature.  No deformity, ecchymosis, erythema, or lesions     Palpation:     Midline spinal tenderness: no      Paralumbar tenderness: yes Left     Parathoracic tenderness: yes Left     Buttocks tenderness: no     Range of Motion:      Flexion: Fingers to Knees     Extension:Decreased     Lateral bending:Decreased    Rotation:Decreased    Neuro  Exam:Lower extremity DTRs normal & symmetric.  Strength and sensation intact.    Special Tests:      Straight leg raise:negative  Musculoskeletal:  Exam found Decreased ROM, Tissue texture changes, Tenderness to palpation and Asymmetry of patient'Robinson  ribs Osteopathic Structural Exam:   Ribs: Ribs 5-9 locked down on the L with lat spasm around it    Results for orders placed or performed in visit on 08/10/16  Microscopic Examination  Result Value Ref Range   WBC, UA >30 (A) 0 - 5 /hpf   RBC, UA 3-10 (A) 0 - 2 /hpf   Epithelial Cells (non renal) 0-10 0 - 10 /hpf   Mucus, UA Present (A) Not Estab.   Bacteria,  UA Few None seen/Few  Urine Culture, Reflex  Result Value Ref Range   Urine Culture, Routine Final report    Organism ID, Bacteria Comment   Thyroid Panel With TSH  Result Value Ref Range   TSH 3.880 0.450 - 4.500 uIU/mL   T4, Total 7.8 4.5 - 12.0 ug/dL   T3 Uptake Ratio 26 24 - 39 %   Free Thyroxine Index 2.0 1.2 - 4.9  CBC with Differential/Platelet  Result Value Ref Range   WBC 5.3 3.4 - 10.8 x10E3/uL   RBC 5.00 3.77 - 5.28 x10E6/uL   Hemoglobin 12.6 11.1 - 15.9 g/dL   Hematocrit 78.240.3 95.634.0 - 46.6 %   MCV 81 79 - 97 fL   MCH 25.2 (L) 26.6 - 33.0 pg   MCHC 31.3 (L) 31.5 - 35.7 g/dL   RDW 21.314.0 08.612.3 - 57.815.4 %   Platelets 337 150 - 379 x10E3/uL   Neutrophils 50 Not Estab. %   Lymphs 36 Not Estab. %   Monocytes 10 Not Estab. %   Eos 3 Not Estab. %   Basos 1 Not Estab. %   Neutrophils Absolute 2.7 1.4 - 7.0 x10E3/uL   Lymphocytes Absolute 1.9 0.7 - 3.1 x10E3/uL   Monocytes Absolute 0.5 0.1 - 0.9 x10E3/uL   EOS (ABSOLUTE) 0.2 0.0 - 0.4 x10E3/uL   Basophils Absolute 0.0 0.0 - 0.2 x10E3/uL   Immature Granulocytes 0 Not Estab. %   Immature Grans (Abs) 0.0 0.0 - 0.1 x10E3/uL  Comprehensive metabolic panel  Result Value Ref Range   Glucose 88 65 - 99 mg/dL   BUN 10 6 - 24 mg/dL   Creatinine, Ser 4.690.89 0.57 - 1.00 mg/dL   GFR calc non Af Amer 78 >59 mL/min/1.73   GFR calc Af Amer 90 >59 mL/min/1.73   BUN/Creatinine Ratio 11 9 - 23   Sodium 141 134 - 144 mmol/L   Potassium 4.4 3.5 - 5.2 mmol/L   Chloride 104 96 - 106 mmol/L   CO2 23 20 - 29 mmol/L   Calcium 9.1 8.7 - 10.2 mg/dL   Total Protein 7.2 6.0 - 8.5 g/dL   Albumin 4.1 3.5 - 5.5 g/dL   Globulin, Total 3.1 1.5 - 4.5 g/dL   Albumin/Globulin Ratio 1.3 1.2 - 2.2   Bilirubin Total 0.4 0.0 - 1.2 mg/dL   Alkaline Phosphatase 79 39 - 117 IU/L   AST 16 0 - 40 IU/L   ALT 16 0 - 32 IU/L  Lipid Panel w/o Chol/HDL Ratio  Result Value Ref Range   Cholesterol, Total 162 100 - 199 mg/dL   Triglycerides 629157 (H) 0 - 149 mg/dL   HDL  32 (L) >52>39 mg/dL   VLDL Cholesterol Cal 31 5 - 40 mg/dL   LDL Calculated  99 0 - 99 mg/dL  UA/M w/rflx Culture, Routine  Result Value Ref Range   Specific Gravity, UA 1.020 1.005 - 1.030   pH, UA 6.5 5.0 - 7.5   Color, UA Yellow Yellow   Appearance Ur Turbid (A) Clear   Leukocytes, UA 1+ (A) Negative   Protein, UA 1+ (A) Negative/Trace   Glucose, UA Negative Negative   Ketones, UA Negative Negative   RBC, UA 3+ (A) Negative   Bilirubin, UA Negative Negative   Urobilinogen, Ur 0.2 0.2 - 1.0 mg/dL   Nitrite, UA Negative Negative   Microscopic Examination See below:    Urinalysis Reflex Comment       Assessment & Plan:   Problem List Items Addressed This Visit    None    Visit Diagnoses    Upper back pain on left side    -  Primary   Seems to be due to a rib dysfunction. Treated today as below. Exercises given. Flexeril and naproxen. Call if not getting better or getting worse.    Relevant Medications   naproxen (NAPROSYN) 500 MG tablet   cyclobenzaprine (FLEXERIL) 10 MG tablet   Segmental dysfunction of rib cage         After verbal consent was obtained, patient was treated today with osteopathic manipulative medicine to the regions of the ribs using the techniques of HVLA and soft tissue. Areas of compensation relating to her primary pain source also treated. Patient tolerated the procedure well with good objective and good subjective improvement in symptoms. She left the room in good condition. She was advised to stay well hydrated and that she may have some soreness following the procedure. If not improving or worsening, she will call and come in. Home exercise program of stretches for ribs and chest discussed and demonstrated today. Patient will do these stretches BID to before the point of pain, and will return for reevaluation  on a PRN basis.   Follow up plan: Return if symptoms worsen or fail to improve.

## 2017-03-03 ENCOUNTER — Other Ambulatory Visit: Payer: Self-pay | Admitting: Family Medicine

## 2017-03-03 ENCOUNTER — Telehealth: Payer: Self-pay | Admitting: Family Medicine

## 2017-03-03 DIAGNOSIS — R921 Mammographic calcification found on diagnostic imaging of breast: Secondary | ICD-10-CM

## 2017-03-03 NOTE — Telephone Encounter (Signed)
Copied from CRM [id number]. Topic: Quick Communication - See Telephone Encounter >> Mar 03, 2017  4:15 PM Diana EvesHoyt, Maryann B wrote: CRM for notification. See Telephone encounter for:  Pt needing a follow up appt at the [facility]. And they are needing an order for a 6 month follow up  03/03/17.

## 2017-03-04 NOTE — Telephone Encounter (Signed)
Order placed

## 2017-04-01 ENCOUNTER — Ambulatory Visit
Admission: RE | Admit: 2017-04-01 | Discharge: 2017-04-01 | Disposition: A | Discharge status: Home / Self Care | Payer: Commercial Managed Care - PPO | Source: Ambulatory Visit | Attending: Family Medicine | Admitting: Family Medicine

## 2017-04-01 ENCOUNTER — Encounter: Payer: Self-pay | Admitting: Family Medicine

## 2017-04-01 ENCOUNTER — Other Ambulatory Visit: Payer: Self-pay | Admitting: Family Medicine

## 2017-04-01 DIAGNOSIS — R921 Mammographic calcification found on diagnostic imaging of breast: Secondary | ICD-10-CM

## 2017-08-12 ENCOUNTER — Encounter: Payer: Self-pay | Admitting: Family Medicine

## 2017-08-12 ENCOUNTER — Ambulatory Visit (INDEPENDENT_AMBULATORY_CARE_PROVIDER_SITE_OTHER): Payer: Commercial Managed Care - PPO | Admitting: Family Medicine

## 2017-08-12 VITALS — BP 128/82 | HR 68 | Temp 98.5°F | Ht 63.5 in | Wt 166.1 lb

## 2017-08-12 DIAGNOSIS — R921 Mammographic calcification found on diagnostic imaging of breast: Secondary | ICD-10-CM

## 2017-08-12 DIAGNOSIS — Z0000 Encounter for general adult medical examination without abnormal findings: Secondary | ICD-10-CM | POA: Diagnosis not present

## 2017-08-12 DIAGNOSIS — R8281 Pyuria: Secondary | ICD-10-CM

## 2017-08-12 DIAGNOSIS — N390 Urinary tract infection, site not specified: Secondary | ICD-10-CM

## 2017-08-12 LAB — UA/M W/RFLX CULTURE, ROUTINE
BILIRUBIN UA: NEGATIVE
GLUCOSE, UA: NEGATIVE
Ketones, UA: NEGATIVE
NITRITE UA: POSITIVE — AB
SPEC GRAV UA: 1.015 (ref 1.005–1.030)
UUROB: 0.2 mg/dL (ref 0.2–1.0)
pH, UA: 6 (ref 5.0–7.5)

## 2017-08-12 LAB — MICROSCOPIC EXAMINATION: WBC, UA: >30 /hpf — AB (ref 0–5)

## 2017-08-12 NOTE — Addendum Note (Signed)
Addended by: Dorcas CarrowJOHNSON, MEGAN P on: 08/12/2017 04:36 PM   Modules accepted: Orders

## 2017-08-12 NOTE — Patient Instructions (Addendum)
[facility] at [facility]  Address: [address] [place], [place], [place] [address]  Phone: [phone]  Health Maintenance, Female Adopting a healthy lifestyle and getting preventive care can go a long way to promote health and wellness. Talk with your health care provider about what schedule of regular examinations is right for you. This is a good chance for you to check in with your provider about disease prevention and staying healthy. In between checkups, there are plenty of things you can do on your own. Experts have done a lot of research about which lifestyle changes and preventive measures are most likely to keep you healthy. Ask your health care provider for more information. Weight and diet Eat a healthy diet  Be sure to include plenty of vegetables, fruits, low-fat dairy products, and lean protein.  Do not eat a lot of foods high in solid fats, added sugars, or salt.  Get regular exercise. This is one of the most important things you can do for your health. ? Most adults should exercise for at least 150 minutes each week. The exercise should increase your heart rate and make you sweat (moderate-intensity exercise). ? Most adults should also do strengthening exercises at least twice a week. This is in addition to the moderate-intensity exercise.  Maintain a healthy weight  Body mass index (BMI) is a measurement that can be used to identify possible weight problems. It estimates body fat based on height and weight. Your health care provider can help determine your BMI and help you achieve or maintain a healthy weight.  For females 75 years of age and older: ? A BMI below 18.5 is considered underweight. ? A BMI of 18.5 to 24.9 is normal. ? A BMI of 25 to 29.9 is considered overweight. ? A BMI of 30 and above is considered obese.  Watch levels of cholesterol and blood lipids  You should start having your blood tested for lipids and cholesterol at 47 years of  age, then have this test every 5 years.  You may need to have your cholesterol levels checked more often if: ? Your lipid or cholesterol levels are high. ? You are older than 47 years of age. ? You are at high risk for heart disease.  Cancer screening Lung Cancer  Lung cancer screening is recommended for adults 52-5 years old who are at high risk for lung cancer because of a history of smoking.  A yearly low-dose CT scan of the lungs is recommended for people who: ? Currently smoke. ? Have quit within the past 15 years. ? Have at least a 30-pack-year history of smoking. A pack year is smoking an average of one pack of cigarettes a day for 1 year.  Yearly screening should continue until it has been 15 years since you quit.  Yearly screening should stop if you develop a health problem that would prevent you from having lung cancer treatment.  Breast Cancer  Practice breast self-awareness. This means understanding how your breasts normally appear and feel.  It also means doing regular breast self-exams. Let your health care provider know about any changes, no matter how small.  If you are in your 20s or 30s, you should have a clinical breast exam (CBE) by a health care provider every 1-3 years as part of a regular health exam.  If you are 9 or older, have a CBE every year. Also consider having a breast X-ray (mammogram) every year.  If you have a  family history of breast cancer, talk to your health care provider about genetic screening.  If you are at high risk for breast cancer, talk to your health care provider about having an MRI and a mammogram every year.  Breast cancer gene (BRCA) assessment is recommended for women who have family members with BRCA-related cancers. BRCA-related cancers include: ? Breast. ? Ovarian. ? Tubal. ? Peritoneal cancers.  Results of the assessment will determine the need for genetic counseling and BRCA1 and BRCA2 testing.  Cervical  Cancer Your health care provider may recommend that you be screened regularly for cancer of the pelvic organs (ovaries, uterus, and vagina). This screening involves a pelvic examination, including checking for microscopic changes to the surface of your cervix (Pap test). You may be encouraged to have this screening done every 3 years, beginning at age 21.  For women ages 30-65, health care providers may recommend pelvic exams and Pap testing every 3 years, or they may recommend the Pap and pelvic exam, combined with testing for human papilloma virus (HPV), every 5 years. Some types of HPV increase your risk of cervical cancer. Testing for HPV may also be done on women of any age with unclear Pap test results.  Other health care providers may not recommend any screening for nonpregnant women who are considered low risk for pelvic cancer and who do not have symptoms. Ask your health care provider if a screening pelvic exam is right for you.  If you have had past treatment for cervical cancer or a condition that could lead to cancer, you need Pap tests and screening for cancer for at least 20 years after your treatment. If Pap tests have been discontinued, your risk factors (such as having a new sexual partner) need to be reassessed to determine if screening should resume. Some women have medical problems that increase the chance of getting cervical cancer. In these cases, your health care provider may recommend more frequent screening and Pap tests.  Colorectal Cancer  This type of cancer can be detected and often prevented.  Routine colorectal cancer screening usually begins at 47 years of age and continues through 47 years of age.  Your health care provider may recommend screening at an earlier age if you have risk factors for colon cancer.  Your health care provider may also recommend using home test kits to check for hidden blood in the stool.  A small camera at the end of a tube can be used to  examine your colon directly (sigmoidoscopy or colonoscopy). This is done to check for the earliest forms of colorectal cancer.  Routine screening usually begins at age 50.  Direct examination of the colon should be repeated every 5-10 years through 47 years of age. However, you may need to be screened more often if early forms of precancerous polyps or small growths are found.  Skin Cancer  Check your skin from head to toe regularly.  Tell your health care provider about any new moles or changes in moles, especially if there is a change in a mole's shape or color.  Also tell your health care provider if you have a mole that is larger than the size of a pencil eraser.  Always use sunscreen. Apply sunscreen liberally and repeatedly throughout the day.  Protect yourself by wearing long sleeves, pants, a wide-brimmed hat, and sunglasses whenever you are outside.  Heart disease, diabetes, and high blood pressure  High blood pressure causes heart disease and increases the risk of   stroke. High blood pressure is more likely to develop in: ? People who have blood pressure in the high end of the normal range (130-139/85-89 mm Hg). ? People who are overweight or obese. ? People who are African American.  If you are 18-39 years of age, have your blood pressure checked every 3-5 years. If you are 40 years of age or older, have your blood pressure checked every year. You should have your blood pressure measured twice-once when you are at a hospital or clinic, and once when you are not at a hospital or clinic. Record the average of the two measurements. To check your blood pressure when you are not at a hospital or clinic, you can use: ? An automated blood pressure machine at a pharmacy. ? A home blood pressure monitor.  If you are between 55 years and 79 years old, ask your health care provider if you should take aspirin to prevent strokes.  Have regular diabetes screenings. This involves taking a  blood sample to check your fasting blood sugar level. ? If you are at a normal weight and have a low risk for diabetes, have this test once every three years after 47 years of age. ? If you are overweight and have a high risk for diabetes, consider being tested at a younger age or more often. Preventing infection Hepatitis B  If you have a higher risk for hepatitis B, you should be screened for this virus. You are considered at high risk for hepatitis B if: ? You were born in a country where hepatitis B is common. Ask your health care provider which countries are considered high risk. ? Your parents were born in a high-risk country, and you have not been immunized against hepatitis B (hepatitis B vaccine). ? You have HIV or AIDS. ? You use needles to inject street drugs. ? You live with someone who has hepatitis B. ? You have had sex with someone who has hepatitis B. ? You get hemodialysis treatment. ? You take certain medicines for conditions, including cancer, organ transplantation, and autoimmune conditions.  Hepatitis C  Blood testing is recommended for: ? Everyone born from 1945 through 1965. ? Anyone with known risk factors for hepatitis C.  Sexually transmitted infections (STIs)  You should be screened for sexually transmitted infections (STIs) including gonorrhea and chlamydia if: ? You are sexually active and are younger than 47 years of age. ? You are older than 47 years of age and your health care provider tells you that you are at risk for this type of infection. ? Your sexual activity has changed since you were last screened and you are at an increased risk for chlamydia or gonorrhea. Ask your health care provider if you are at risk.  If you do not have HIV, but are at risk, it may be recommended that you take a prescription medicine daily to prevent HIV infection. This is called pre-exposure prophylaxis (PrEP). You are considered at risk if: ? You are sexually active and  do not regularly use condoms or know the HIV status of your partner(s). ? You take drugs by injection. ? You are sexually active with a partner who has HIV.  Talk with your health care provider about whether you are at high risk of being infected with HIV. If you choose to begin PrEP, you should first be tested for HIV. You should then be tested every 3 months for as long as you are taking PrEP. Pregnancy  If   you are premenopausal and you may become pregnant, ask your health care provider about preconception counseling.  If you may become pregnant, take 400 to 800 micrograms (mcg) of folic acid every day.  If you want to prevent pregnancy, talk to your health care provider about birth control (contraception). Osteoporosis and menopause  Osteoporosis is a disease in which the bones lose minerals and strength with aging. This can result in serious bone fractures. Your risk for osteoporosis can be identified using a bone density scan.  If you are 23 years of age or older, or if you are at risk for osteoporosis and fractures, ask your health care provider if you should be screened.  Ask your health care provider whether you should take a calcium or vitamin D supplement to lower your risk for osteoporosis.  Menopause may have certain physical symptoms and risks.  Hormone replacement therapy may reduce some of these symptoms and risks. Talk to your health care provider about whether hormone replacement therapy is right for you. Follow these instructions at home:  Schedule regular health, dental, and eye exams.  Stay current with your immunizations.  Do not use any tobacco products including cigarettes, chewing tobacco, or electronic cigarettes.  If you are pregnant, do not drink alcohol.  If you are breastfeeding, limit how much and how often you drink alcohol.  Limit alcohol intake to no more than 1 drink per day for nonpregnant women. One drink equals 12 ounces of beer, 5 ounces of  wine, or 1 ounces of hard liquor.  Do not use street drugs.  Do not share needles.  Ask your health care provider for help if you need support or information about quitting drugs.  Tell your health care provider if you often feel depressed.  Tell your health care provider if you have ever been abused or do not feel safe at home. This information is not intended to replace advice given to you by your health care provider. Make sure you discuss any questions you have with your health care provider. Document Released: 09/01/2010 Document Revised: 07/25/2015 Document Reviewed: 11/20/2014 Elsevier Interactive Patient Education  [employer].

## 2017-08-12 NOTE — Progress Notes (Signed)
BP 128/82 (BP Location: Left Arm, Patient Position: Sitting, Cuff Size: Normal)   Pulse 68   Temp 98.5 F (36.9 C)   Ht 5' 3.5" (1.613 m)   Wt 166 lb 1 oz (75.3 kg)   SpO2 99%   BMI 28.96 kg/m    Subjective:    Patient ID: Candice Robinson, female    DOB: [birth year], 47 y.o.   MRN: [id number]  HPI: Candice Robinson is a 47 y.o. female presenting on 08/12/2017 for comprehensive medical examination. Current medical complaints include: none  Menopausal Symptoms: no  Depression Screen done today and results listed below:  Depression screen [facility] 2/9 08/12/2017 08/10/2016 05/16/2015  Decreased Interest 0 0 0  Down, Depressed, Hopeless 0 0 0  PHQ - 2 Score 0 0 0  Altered sleeping 1 - -  Tired, decreased energy 1 - -  Change in appetite 0 - -  Feeling bad or failure about yourself  0 - -  Trouble concentrating 0 - -  Moving slowly or fidgety/restless 0 - -  PHQ-9 Score 2 - -  Difficult doing work/chores Not difficult at all - -    Past Medical History:  History reviewed. No pertinent past medical history.  Surgical History:  Past Surgical History:  Procedure Laterality Date  . TUBAL LIGATION  1996  . tummy tuck  2004    Medications:  No current outpatient medications on file prior to visit.   No current facility-administered medications on file prior to visit.     Allergies:  Allergies  Allergen Reactions  . Codeine Other (See Comments)    Hallucinations    Social History:  Social History   Socioeconomic History  . Marital status: Single    Spouse name: Not on file  . Number of children: Not on file  . Years of education: Not on file  . Highest education level: Not on file  Occupational History  . Not on file  Social Needs  . Financial resource strain: Not on file  . Food insecurity:    Worry: Not on file    Inability: Not on file  . Transportation needs:    Medical: Not on file    Non-medical: Not on file  Tobacco Use  . Smoking status: Never Smoker    . Smokeless tobacco: Never Used  Substance and Sexual Activity  . Alcohol use: Yes    Comment: socially  . Drug use: No  . Sexual activity: Yes    Birth control/protection: Surgical  Lifestyle  . Physical activity:    Days per week: Not on file    Minutes per session: Not on file  . Stress: Not on file  Relationships  . Social connections:    Talks on phone: Not on file    Gets together: Not on file    Attends religious service: Not on file    Active member of club or organization: Not on file    Attends meetings of clubs or organizations: Not on file    Relationship status: Not on file  . Intimate partner violence:    Fear of current or ex partner: Not on file    Emotionally abused: Not on file    Physically abused: Not on file    Forced sexual activity: Not on file  Other Topics Concern  . Not on file  Social History Narrative  . Not on file   Social History   Tobacco Use  Smoking Status Never Smoker  Smokeless Tobacco Never Used   Social History   Substance and Sexual Activity  Alcohol Use Yes   Comment: socially    Family History:  Family History  Problem Relation Age of Onset  . Diabetes Mother   . Hyperlipidemia Mother   . Hypertension Mother   . Hyperlipidemia Father   . Hypertension Father   . Heart disease Father   . Lupus Sister   . Diabetes Sister   . Hypertension Sister   . Breast cancer Maternal Aunt   . Breast cancer Maternal Aunt     Past medical history, surgical history, medications, allergies, family history and social history reviewed with patient today and changes made to appropriate areas of the chart.   Review of Systems  Constitutional: Negative.   HENT: Negative.   Eyes: Negative.   Respiratory: Negative.   Cardiovascular: Negative.   Gastrointestinal: Positive for constipation. Negative for abdominal pain, blood in stool, diarrhea, heartburn, melena, nausea and vomiting.  Genitourinary: Negative.   Musculoskeletal:  Negative.   Skin: Negative.   Neurological: Negative.   Endo/Heme/Allergies: Negative.   Psychiatric/Behavioral: Negative.     All other ROS negative except what is listed above and in the HPI.      Objective:    BP 128/82 (BP Location: Left Arm, Patient Position: Sitting, Cuff Size: Normal)   Pulse 68   Temp 98.5 F (36.9 C)   Ht 5' 3.5" (1.613 m)   Wt 166 lb 1 oz (75.3 kg)   SpO2 99%   BMI 28.96 kg/m   Wt Readings from Last 3 Encounters:  08/12/17 166 lb 1 oz (75.3 kg)  02/04/17 170 lb 6.4 oz (77.3 kg)  08/10/16 166 lb 8 oz (75.5 kg)    Physical Exam  Constitutional: She is oriented to person, place, and time. She appears well-developed and well-nourished. No distress.  HENT:  Head: Normocephalic and atraumatic.  Right Ear: Hearing, tympanic membrane, external ear and ear canal normal.  Left Ear: Hearing, tympanic membrane, external ear and ear canal normal.  Nose: Nose normal.  Mouth/Throat: Uvula is midline, oropharynx is clear and moist and mucous membranes are normal. No oropharyngeal exudate.  Eyes: Pupils are equal, round, and reactive to light. Conjunctivae, EOM and lids are normal. Right eye exhibits no discharge. Left eye exhibits no discharge. No scleral icterus.  Neck: Normal range of motion. Neck supple. No JVD present. No tracheal deviation present. No thyromegaly present.  Cardiovascular: Normal rate, regular rhythm, normal heart sounds and intact distal pulses. Exam reveals no gallop and no friction rub.  No murmur heard. Pulmonary/Chest: Effort normal and breath sounds normal. No stridor. No respiratory distress. She has no wheezes. She has no rales. She exhibits no tenderness.  Abdominal: Soft. Bowel sounds are normal. She exhibits no distension and no mass. There is no tenderness. There is no rebound and no guarding. No hernia.  Genitourinary:  Genitourinary Comments: Breast and pelvic exams deferred- done at GYN  Musculoskeletal: Normal range of motion.  She exhibits no edema, tenderness or deformity.  Lymphadenopathy:    She has no cervical adenopathy.  Neurological: She is alert and oriented to person, place, and time. She displays normal reflexes. No cranial nerve deficit or sensory deficit. She exhibits normal muscle tone. Coordination normal.  Skin: Skin is warm and intact. Capillary refill takes less than 2 seconds. No rash noted. She is not diaphoretic. No erythema. No pallor.  Psychiatric: She has a normal mood and affect. Her speech is normal  and behavior is normal. Judgment and thought content normal. Cognition and memory are normal.  Nursing note and vitals reviewed.   Results for orders placed or performed in visit on 08/10/16  Microscopic Examination  Result Value Ref Range   WBC, UA >30 (A) 0 - 5 /hpf   RBC, UA 3-10 (A) 0 - 2 /hpf   Epithelial Cells (non renal) 0-10 0 - 10 /hpf   Mucus, UA Present (A) Not Estab.   Bacteria, UA Few None seen/Few  Urine Culture, Reflex  Result Value Ref Range   Urine Culture, Routine Final report    Organism ID, Bacteria Comment   Thyroid Panel With TSH  Result Value Ref Range   TSH 3.880 0.450 - 4.500 uIU/mL   T4, Total 7.8 4.5 - 12.0 ug/dL   T3 Uptake Ratio 26 24 - 39 %   Free Thyroxine Index 2.0 1.2 - 4.9  CBC with Differential/Platelet  Result Value Ref Range   WBC 5.3 3.4 - 10.8 x10E3/uL   RBC 5.00 3.77 - 5.28 x10E6/uL   Hemoglobin 12.6 11.1 - 15.9 g/dL   Hematocrit 21.340.3 08.634.0 - 46.6 %   MCV 81 79 - 97 fL   MCH 25.2 (L) 26.6 - 33.0 pg   MCHC 31.3 (L) 31.5 - 35.7 g/dL   RDW 57.814.0 46.912.3 - 62.915.4 %   Platelets 337 150 - 379 x10E3/uL   Neutrophils 50 Not Estab. %   Lymphs 36 Not Estab. %   Monocytes 10 Not Estab. %   Eos 3 Not Estab. %   Basos 1 Not Estab. %   Neutrophils Absolute 2.7 1.4 - 7.0 x10E3/uL   Lymphocytes Absolute 1.9 0.7 - 3.1 x10E3/uL   Monocytes Absolute 0.5 0.1 - 0.9 x10E3/uL   EOS (ABSOLUTE) 0.2 0.0 - 0.4 x10E3/uL   Basophils Absolute 0.0 0.0 - 0.2 x10E3/uL    Immature Granulocytes 0 Not Estab. %   Immature Grans (Abs) 0.0 0.0 - 0.1 x10E3/uL  Comprehensive metabolic panel  Result Value Ref Range   Glucose 88 65 - 99 mg/dL   BUN 10 6 - 24 mg/dL   Creatinine, Ser 5.280.89 0.57 - 1.00 mg/dL   GFR calc non Af Amer 78 >59 mL/min/1.73   GFR calc Af Amer 90 >59 mL/min/1.73   BUN/Creatinine Ratio 11 9 - 23   Sodium 141 134 - 144 mmol/L   Potassium 4.4 3.5 - 5.2 mmol/L   Chloride 104 96 - 106 mmol/L   CO2 23 20 - 29 mmol/L   Calcium 9.1 8.7 - 10.2 mg/dL   Total Protein 7.2 6.0 - 8.5 g/dL   Albumin 4.1 3.5 - 5.5 g/dL   Globulin, Total 3.1 1.5 - 4.5 g/dL   Albumin/Globulin Ratio 1.3 1.2 - 2.2   Bilirubin Total 0.4 0.0 - 1.2 mg/dL   Alkaline Phosphatase 79 39 - 117 IU/L   AST 16 0 - 40 IU/L   ALT 16 0 - 32 IU/L  Lipid Panel w/o Chol/HDL Ratio  Result Value Ref Range   Cholesterol, Total 162 100 - 199 mg/dL   Triglycerides 413157 (H) 0 - 149 mg/dL   HDL 32 (L) >24>39 mg/dL   VLDL Cholesterol Cal 31 5 - 40 mg/dL   LDL Calculated 99 0 - 99 mg/dL  UA/M w/rflx Culture, Routine  Result Value Ref Range   Specific Gravity, UA 1.020 1.005 - 1.030   pH, UA 6.5 5.0 - 7.5   Color, UA Yellow Yellow   Appearance Ur Turbid (  A) Clear   Leukocytes, UA 1+ (A) Negative   Protein, UA 1+ (A) Negative/Trace   Glucose, UA Negative Negative   Ketones, UA Negative Negative   RBC, UA 3+ (A) Negative   Bilirubin, UA Negative Negative   Urobilinogen, Ur 0.2 0.2 - 1.0 mg/dL   Nitrite, UA Negative Negative   Microscopic Examination See below:    Urinalysis Reflex Comment       Assessment & Plan:   Problem List Items Addressed This Visit    None    Visit Diagnoses    Routine general medical examination at a health care facility    -  Primary   Vaccines up to date. Screening labs checked today. Pap done at GYN. Mammogram ordered. Continue diet and exercise. Call with any concerns.    Relevant Orders   CBC with Differential/Platelet   Comprehensive metabolic panel    Lipid Panel w/o Chol/HDL Ratio   TSH   UA/M w/rflx Culture, Routine   Breast calcifications       Follow up mammogram ordered.    Relevant Orders   MM Digital Diagnostic Bilat       Follow up plan: Return in about 1 year (around 08/13/2018) for Physical, records release for GYN please.   LABORATORY TESTING:  - Pap smear: done elsewhere  IMMUNIZATIONS:   - Tdap: Tetanus vaccination status reviewed: last tetanus booster within 10 years. - Influenza: Postponed to flu season  SCREENING: -Mammogram: Ordered today   PATIENT COUNSELING:   Advised to take 1 mg of folate supplement per day if capable of pregnancy.   Sexuality: Discussed sexually transmitted diseases, partner selection, use of condoms, avoidance of unintended pregnancy  and contraceptive alternatives.   Advised to avoid cigarette smoking.  I discussed with the patient that most people either abstain from alcohol or drink within safe limits (<=14/week and <=4 drinks/occasion for males, <=7/weeks and <= 3 drinks/occasion for females) and that the risk for alcohol disorders and other health effects rises proportionally with the number of drinks per week and how often a drinker exceeds daily limits.  Discussed cessation/primary prevention of drug use and availability of treatment for abuse.   Diet: Encouraged to adjust caloric intake to maintain  or achieve ideal body weight, to reduce intake of dietary saturated fat and total fat, to limit sodium intake by avoiding high sodium foods and not adding table salt, and to maintain adequate dietary potassium and calcium preferably from fresh fruits, vegetables, and low-fat dairy products.    stressed the importance of regular exercise  Injury prevention: Discussed safety belts, safety helmets, smoke detector, smoking near bedding or upholstery.   Dental health: Discussed importance of regular tooth brushing, flossing, and dental visits.    NEXT PREVENTATIVE PHYSICAL DUE IN 1  YEAR. Return in about 1 year (around 08/13/2018) for Physical, records release for GYN please.

## 2017-08-13 ENCOUNTER — Encounter: Payer: Self-pay | Admitting: Family Medicine

## 2017-08-13 LAB — CBC WITH DIFFERENTIAL/PLATELET
BASOS: 1 %
Basophils Absolute: 0 10*3/uL (ref 0.0–0.2)
EOS (ABSOLUTE): 0.2 10*3/uL (ref 0.0–0.4)
Eos: 3 %
Hematocrit: 38.8 % (ref 34.0–46.6)
Hemoglobin: 12.8 g/dL (ref 11.1–15.9)
IMMATURE GRANULOCYTES: 0 %
Immature Grans (Abs): 0 10*3/uL (ref 0.0–0.1)
Lymphocytes Absolute: 2 10*3/uL (ref 0.7–3.1)
Lymphs: 31 %
MCH: 26 pg — AB (ref 26.6–33.0)
MCHC: 33 g/dL (ref 31.5–35.7)
MCV: 79 fL (ref 79–97)
MONOS ABS: 0.5 10*3/uL (ref 0.1–0.9)
Monocytes: 7 %
Neutrophils Absolute: 3.7 10*3/uL (ref 1.4–7.0)
Neutrophils: 58 %
PLATELETS: 371 10*3/uL (ref 150–450)
RBC: 4.92 x10E6/uL (ref 3.77–5.28)
RDW: 14.9 % (ref 12.3–15.4)
WBC: 6.4 10*3/uL (ref 3.4–10.8)

## 2017-08-13 LAB — COMPREHENSIVE METABOLIC PANEL
A/G RATIO: 1.2 (ref 1.2–2.2)
ALK PHOS: 87 IU/L (ref 39–117)
ALT: 11 IU/L (ref 0–32)
AST: 12 IU/L (ref 0–40)
Albumin: 3.8 g/dL (ref 3.5–5.5)
BILIRUBIN TOTAL: 0.3 mg/dL (ref 0.0–1.2)
BUN/Creatinine Ratio: 18 (ref 9–23)
BUN: 14 mg/dL (ref 6–24)
CALCIUM: 9.1 mg/dL (ref 8.7–10.2)
CHLORIDE: 105 mmol/L (ref 96–106)
CO2: 23 mmol/L (ref 20–29)
Creatinine, Ser: 0.8 mg/dL (ref 0.57–1.00)
GFR calc Af Amer: 102 mL/min/{1.73_m2} (ref >59)
GFR calc non Af Amer: 88 mL/min/{1.73_m2} (ref >59)
Globulin, Total: 3.1 g/dL (ref 1.5–4.5)
Glucose: 75 mg/dL (ref 65–99)
Potassium: 4.3 mmol/L (ref 3.5–5.2)
Sodium: 141 mmol/L (ref 134–144)
Total Protein: 6.9 g/dL (ref 6.0–8.5)

## 2017-08-13 LAB — LIPID PANEL W/O CHOL/HDL RATIO
CHOLESTEROL TOTAL: 185 mg/dL (ref 100–199)
HDL: 33 mg/dL — AB (ref >39)
LDL Calculated: 124 mg/dL — ABNORMAL HIGH (ref 0–99)
TRIGLYCERIDES: 138 mg/dL (ref 0–149)
VLDL Cholesterol Cal: 28 mg/dL (ref 5–40)

## 2017-08-13 LAB — TSH: TSH: 3.98 u[IU]/mL (ref 0.450–4.500)

## 2017-08-14 LAB — URINE CULTURE

## 2017-08-16 ENCOUNTER — Telehealth: Payer: Self-pay | Admitting: Family Medicine

## 2017-08-16 MED ORDER — AMOXICILLIN 875 MG PO TABS: 875.0000 mg | ORAL_TABLET | Freq: Two times a day (BID) | ORAL | 0 refills | Status: DC

## 2017-08-16 NOTE — Telephone Encounter (Signed)
Please let her know that her urine grew out an infection. I've sent an antibiotic over to her pharmacy. Thanks!

## 2017-08-16 NOTE — Telephone Encounter (Signed)
Patient notified

## 2017-08-31 ENCOUNTER — Ambulatory Visit: Payer: Commercial Managed Care - PPO | Admitting: Family Medicine

## 2017-08-31 ENCOUNTER — Encounter: Payer: Self-pay | Admitting: Family Medicine

## 2017-08-31 VITALS — BP 129/78 | HR 93 | Temp 98.3°F | Wt 167.7 lb

## 2017-08-31 DIAGNOSIS — N390 Urinary tract infection, site not specified: Secondary | ICD-10-CM

## 2017-08-31 LAB — UA/M W/RFLX CULTURE, ROUTINE
BILIRUBIN UA: NEGATIVE
GLUCOSE, UA: NEGATIVE
KETONES UA: NEGATIVE
NITRITE UA: NEGATIVE
Specific Gravity, UA: <1.005 — ABNORMAL LOW (ref 1.005–1.030)
UUROB: 0.2 mg/dL (ref 0.2–1.0)
pH, UA: 6.5 (ref 5.0–7.5)

## 2017-08-31 LAB — MICROSCOPIC EXAMINATION: WBC, UA: >30 /hpf — AB (ref 0–5)

## 2017-08-31 MED ORDER — SULFAMETHOXAZOLE-TRIMETHOPRIM 800-160 MG PO TABS: 1.0000 | ORAL_TABLET | Freq: Two times a day (BID) | ORAL | 0 refills | Status: DC

## 2017-08-31 MED ORDER — PHENAZOPYRIDINE HCL 200 MG PO TABS: 200.0000 mg | ORAL_TABLET | Freq: Three times a day (TID) | ORAL | 0 refills | Status: DC | PRN

## 2017-08-31 NOTE — Progress Notes (Signed)
BP 129/78 (BP Location: Right Arm, Patient Position: Sitting, Cuff Size: Normal)   Pulse 93   Temp 98.3 F (36.8 C) (Oral)   Wt 167 lb 11.2 oz (76.1 kg)   SpO2 100%   BMI 29.24 kg/m    Subjective:    Patient ID: Candice Robinson, female    DOB: Jun 28, 1970, 47 y.o.   MRN: [id number]  HPI: Candice Robinson is a 47 y.o. female  Chief Complaint  Patient presents with  . Dysuria    Onging for 2 days.  . Urinary Urgency  . Abdominal Pain    Lower abdominal pain.   Pt here today for 2 days of dysuria, b/l low back aches, constant lower abdominal aching. Just finished amoxil several days ago for a UTI incidentally found during CPE. Denies fever, chills, N/V, hematuria. Has not taken anything OTC since onset.   Relevant past medical, surgical, family and social history reviewed and updated as indicated. Interim medical history since our last visit reviewed. Allergies and medications reviewed and updated.  Review of Systems  Per HPI unless specifically indicated above     Objective:    BP 129/78 (BP Location: Right Arm, Patient Position: Sitting, Cuff Size: Normal)   Pulse 93   Temp 98.3 F (36.8 C) (Oral)   Wt 167 lb 11.2 oz (76.1 kg)   SpO2 100%   BMI 29.24 kg/m   Wt Readings from Last 3 Encounters:  08/31/17 167 lb 11.2 oz (76.1 kg)  08/12/17 166 lb 1 oz (75.3 kg)  02/04/17 170 lb 6.4 oz (77.3 kg)    Physical Exam  Constitutional: She is oriented to person, place, and time. She appears well-developed and well-nourished. She does not appear ill.  HENT:  Head: Atraumatic.  Eyes: Conjunctivae are normal.  Neck: Normal range of motion. Neck supple.  Cardiovascular: Normal rate and regular rhythm.  Pulmonary/Chest: Effort normal.  Abdominal: Soft. Bowel sounds are normal. There is tenderness (suprapubic ttp).  Musculoskeletal: Normal range of motion. She exhibits no tenderness (No CVA tenderness b/l).  Neurological: She is alert and oriented to person, place, and  time.  Skin: Skin is warm and dry.  Psychiatric: She has a normal mood and affect. Her behavior is normal.  Nursing note and vitals reviewed.   Results for orders placed or performed in visit on 08/31/17  Urine Culture  Result Value Ref Range   Urine Culture, Routine Final report (A)    Organism ID, Bacteria Escherichia coli (A)    Antimicrobial Susceptibility Comment   Microscopic Examination  Result Value Ref Range   WBC, UA >30 (A) 0 - 5 /hpf   RBC, UA 0-2 0 - 2 /hpf   Epithelial Cells (non renal) 0-10 0 - 10 /hpf   Renal Epithel, UA 0-10 (A) None seen /hpf   Bacteria, UA Moderate (A) None seen/Few  UA/M w/rflx Culture, Routine  Result Value Ref Range   Specific Gravity, UA <1.005 (L) 1.005 - 1.030   pH, UA 6.5 5.0 - 7.5   Color, UA Straw Yellow   Appearance Ur Cloudy (A) Clear   Leukocytes, UA 3+ (A) Negative   Protein, UA Trace (A) Negative/Trace   Glucose, UA Negative Negative   Ketones, UA Negative Negative   RBC, UA 1+ (A) Negative   Bilirubin, UA Negative Negative   Urobilinogen, Ur 0.2 0.2 - 1.0 mg/dL   Nitrite, UA Negative Negative   Microscopic Examination See below:       Assessment &  Plan:   Problem List Items Addressed This Visit    None    Visit Diagnoses    Acute lower UTI    -  Primary   U/A +, await cx and tx with bactrim and pyridium prn. Push fluids, f/u if worsening or no improvement   Relevant Medications   sulfamethoxazole-trimethoprim (BACTRIM DS,SEPTRA DS) 800-160 MG tablet   phenazopyridine (PYRIDIUM) 200 MG tablet   Other Relevant Orders   UA/M w/rflx Culture, Routine (Completed)   Urine Culture (Completed)       Follow up plan: Return if symptoms worsen or fail to improve.

## 2017-09-02 LAB — URINE CULTURE

## 2017-09-02 NOTE — Patient Instructions (Signed)
Follow up as needed

## 2017-09-10 ENCOUNTER — Telehealth: Payer: Self-pay | Admitting: Family Medicine

## 2017-09-10 NOTE — Telephone Encounter (Signed)
I did not see this patient this day. It appears she saw Fleet ContrasRachel for a UTI- is that what she's talking about? She did have a UTI, but it should have been treated by her bactrim. If she is not better, she should be seen.   Not high priority. Changed to routine priority.

## 2017-09-10 NOTE — Telephone Encounter (Signed)
Called and spoke to patient. She states that she is still having a few symptoms. Scheduled appointment with Fleet Contrasachel 09/13/17.

## 2017-09-10 NOTE — Telephone Encounter (Signed)
Copied from CRM [id number]. Topic: Quick Communication - See Telephone Encounter >> Sep 10, 2017  3:48 PM Maia Pettiesrtiz, Kristie S wrote: CRM for notification. See Telephone encounter for: 09/10/17.  Pt calling for lab results from 08/31/17. Please advise

## 2017-09-13 ENCOUNTER — Ambulatory Visit: Payer: Commercial Managed Care - PPO | Admitting: Family Medicine

## 2017-09-13 ENCOUNTER — Encounter: Payer: Self-pay | Admitting: Family Medicine

## 2017-09-13 VITALS — BP 128/83 | HR 76 | Temp 97.6°F | Ht 63.0 in | Wt 165.8 lb

## 2017-09-13 DIAGNOSIS — N390 Urinary tract infection, site not specified: Secondary | ICD-10-CM | POA: Diagnosis not present

## 2017-09-13 LAB — UA/M W/RFLX CULTURE, ROUTINE
BILIRUBIN UA: NEGATIVE
Glucose, UA: NEGATIVE
Ketones, UA: NEGATIVE
NITRITE UA: POSITIVE — AB
Specific Gravity, UA: 1.02 (ref 1.005–1.030)
UUROB: 0.2 mg/dL (ref 0.2–1.0)
pH, UA: 5.5 (ref 5.0–7.5)

## 2017-09-13 LAB — MICROSCOPIC EXAMINATION

## 2017-09-13 MED ORDER — NITROFURANTOIN MONOHYD MACRO 100 MG PO CAPS: 100.0000 mg | ORAL_CAPSULE | Freq: Two times a day (BID) | ORAL | 0 refills | Status: DC

## 2017-09-13 NOTE — Patient Instructions (Signed)
Follow up as needed

## 2017-09-13 NOTE — Progress Notes (Signed)
BP 128/83 (BP Location: Right Arm, Patient Position: Sitting, Cuff Size: Normal)   Pulse 76   Temp 97.6 F (36.4 C) (Oral)   Ht 5\' 3"  (1.6 m)   Wt 165 lb 12.8 oz (75.2 kg)   SpO2 98%   BMI 29.37 kg/m    Subjective:    Patient ID: Candice Robinson, female    DOB: September 17, 1970, 47 y.o.   MRN: [id number]  HPI: Candice SayersRobbin S Rys is a 47 y.o. female  Chief Complaint  Patient presents with  . Dysuria   Pt here for dysuria x 5 days. Recently treated with bactrim with complete resolution for a UTI with a culture showing no resistance. No fevers, N/V, back pain, hematuria, abdominal pain. Only the dysuria has returned. Has not tried anything OTC for sxs but does have pyridium leftover at home. Trying to stay well hydrated.   Relevant past medical, surgical, family and social history reviewed and updated as indicated. Interim medical history since our last visit reviewed. Allergies and medications reviewed and updated.  Review of Systems  Per HPI unless specifically indicated above     Objective:    BP 128/83 (BP Location: Right Arm, Patient Position: Sitting, Cuff Size: Normal)   Pulse 76   Temp 97.6 F (36.4 C) (Oral)   Ht 5\' 3"  (1.6 m)   Wt 165 lb 12.8 oz (75.2 kg)   SpO2 98%   BMI 29.37 kg/m   Wt Readings from Last 3 Encounters:  09/13/17 165 lb 12.8 oz (75.2 kg)  08/31/17 167 lb 11.2 oz (76.1 kg)  08/12/17 166 lb 1 oz (75.3 kg)    Physical Exam  Constitutional: She is oriented to person, place, and time. She appears well-developed and well-nourished. No distress.  HENT:  Head: Atraumatic.  Eyes: Conjunctivae and EOM are normal.  Neck: Normal range of motion. Neck supple.  Cardiovascular: Normal rate and regular rhythm.  Pulmonary/Chest: Effort normal and breath sounds normal.  Abdominal: Soft. Bowel sounds are normal. She exhibits no distension. There is no tenderness.  Musculoskeletal: Normal range of motion. She exhibits no tenderness (No CVA tenderness b/l).    Neurological: She is alert and oriented to person, place, and time.  Skin: Skin is warm and dry.  Psychiatric: She has a normal mood and affect. Her behavior is normal.  Nursing note and vitals reviewed.   Results for orders placed or performed in visit on 09/13/17  Microscopic Examination  Result Value Ref Range   WBC, UA >30 (A) 0 - 5 /hpf   RBC, UA 11-30 (A) 0 - 2 /hpf   Epithelial Cells (non renal) 0-10 0 - 10 /hpf   Renal Epithel, UA 0-10 (A) None seen /hpf   Bacteria, UA Many (A) None seen/Few  UA/M w/rflx Culture, Routine  Result Value Ref Range   Specific Gravity, UA 1.020 1.005 - 1.030   pH, UA 5.5 5.0 - 7.5   Color, UA Yellow Yellow   Appearance Ur Turbid (A) Clear   Leukocytes, UA 3+ (A) Negative   Protein, UA 2+ (A) Negative/Trace   Glucose, UA Negative Negative   Ketones, UA Negative Negative   RBC, UA 3+ (A) Negative   Bilirubin, UA Negative Negative   Urobilinogen, Ur 0.2 0.2 - 1.0 mg/dL   Nitrite, UA Positive (A) Negative   Microscopic Examination See below:       Assessment & Plan:   Problem List Items Addressed This Visit    None  Visit Diagnoses    Acute lower UTI    -  Primary   Tx with macrobid, await urine cx. Pyridium prn. Discussed probiotics, hydration, full voiding of bladder, cranberry capsules. F/u if no improvement.    Relevant Medications   nitrofurantoin, macrocrystal-monohydrate, (MACROBID) 100 MG capsule   Other Relevant Orders   UA/M w/rflx Culture, Routine (Completed)   Urine Culture       Follow up plan: Return if symptoms worsen or fail to improve.

## 2017-09-15 LAB — URINE CULTURE

## 2017-09-20 ENCOUNTER — Telehealth: Payer: Self-pay | Admitting: Family Medicine

## 2017-09-20 MED ORDER — AMOXICILLIN-POT CLAVULANATE 875-125 MG PO TABS: 1.0000 | ORAL_TABLET | Freq: Two times a day (BID) | ORAL | 0 refills | Status: DC

## 2017-09-20 NOTE — Telephone Encounter (Signed)
Copied from CRM [id number]. Topic: Quick Communication - See Telephone Encounter >> Sep 20, 2017  3:28 PM Terisa Starraylor, Brittany L wrote: CRM for notification. See Telephone encounter for: 09/20/17.  Patient said nitrofurantoin, macrocrystal-monohydrate, (MACROBID) 100 MG capsule is not working and wants to know can Dr Laural BenesJohnson call something different in because she is not getting rid of the UTI. If she needs to come back in for an appointment, please call patient. [phone]

## 2017-09-20 NOTE — Telephone Encounter (Signed)
Please let her know that I've sent through augmentin through to her pharmacy. Thanks!

## 2017-09-20 NOTE — Telephone Encounter (Signed)
Patient notified

## 2017-09-23 ENCOUNTER — Encounter: Payer: Self-pay | Admitting: Family Medicine

## 2017-09-23 ENCOUNTER — Ambulatory Visit
Admission: RE | Admit: 2017-09-23 | Discharge: 2017-09-23 | Disposition: A | Discharge status: Home / Self Care | Payer: Commercial Managed Care - PPO | Source: Ambulatory Visit | Attending: Family Medicine | Admitting: Family Medicine

## 2017-09-23 DIAGNOSIS — R921 Mammographic calcification found on diagnostic imaging of breast: Secondary | ICD-10-CM | POA: Diagnosis not present

## 2017-10-04 ENCOUNTER — Encounter: Payer: Self-pay | Admitting: Family Medicine

## 2017-10-04 ENCOUNTER — Ambulatory Visit: Payer: Commercial Managed Care - PPO | Admitting: Family Medicine

## 2017-10-04 VITALS — BP 123/85 | HR 77 | Temp 98.4°F | Ht 63.0 in | Wt 163.2 lb

## 2017-10-04 DIAGNOSIS — N390 Urinary tract infection, site not specified: Secondary | ICD-10-CM | POA: Diagnosis not present

## 2017-10-04 DIAGNOSIS — M545 Low back pain, unspecified: Secondary | ICD-10-CM

## 2017-10-04 DIAGNOSIS — N12 Tubulo-interstitial nephritis, not specified as acute or chronic: Secondary | ICD-10-CM | POA: Diagnosis not present

## 2017-10-04 LAB — CBC WITH DIFFERENTIAL/PLATELET
HEMOGLOBIN: 12.2 g/dL (ref 11.1–15.9)
Hematocrit: 38 % (ref 34.0–46.6)
Lymphocytes Absolute: 2.6 10*3/uL (ref 0.7–3.1)
Lymphs: 26 %
MCH: 25 pg — AB (ref 26.6–33.0)
MCHC: 32.1 g/dL (ref 31.5–35.7)
MCV: 78 fL — AB (ref 79–97)
MID (Absolute): 1.4 10*3/uL (ref 0.1–1.6)
MID: 15 %
NEUTROS ABS: 5.9 10*3/uL (ref 1.4–7.0)
NEUTROS PCT: 59 %
Platelets: 376 10*3/uL (ref 150–450)
RBC: 4.88 x10E6/uL (ref 3.77–5.28)
RDW: 15.3 % (ref 12.3–15.4)
WBC: 9.9 10*3/uL (ref 3.4–10.8)

## 2017-10-04 LAB — MICROSCOPIC EXAMINATION
Epithelial Cells (non renal): >10 /hpf — AB (ref 0–10)
RBC, UA: >30 /hpf — AB (ref 0–2)
WBC, UA: >30 /hpf — AB (ref 0–5)

## 2017-10-04 LAB — UA/M W/RFLX CULTURE, ROUTINE
Bilirubin, UA: NEGATIVE
GLUCOSE, UA: NEGATIVE
Ketones, UA: NEGATIVE
NITRITE UA: NEGATIVE
PH UA: 5.5 (ref 5.0–7.5)
Specific Gravity, UA: >1.03 — ABNORMAL HIGH (ref 1.005–1.030)
UUROB: 0.2 mg/dL (ref 0.2–1.0)

## 2017-10-04 MED ORDER — AMOXICILLIN-POT CLAVULANATE 875-125 MG PO TABS: 1.0000 | ORAL_TABLET | Freq: Two times a day (BID) | ORAL | 0 refills | Status: DC

## 2017-10-04 NOTE — Progress Notes (Signed)
BP 123/85   Pulse 77   Temp 98.4 F (36.9 C) (Oral)   Ht 5\' 3"  (1.6 m)   Wt 163 lb 3.2 oz (74 kg)   LMP 09/09/2017   SpO2 99%   BMI 28.91 kg/m    Subjective:    Patient ID: Candice Robinson, female    DOB: [birth year], 47 y.o.   MRN: [id number]  HPI: Candice Robinson is a 47 y.o. female  Chief Complaint  Patient presents with  . Urinary Tract Infection    pt states she was seen at [facility] over the weekend, started on cipro 500 mg for a kidney infection and told to f/up with PCP today   Seen at UC this weekend for significant right low back soreness, N/V 3 days ago. U/A + for significant bacteria, WBC count of nearly 18,000. Sxs are some better, but having chills and aches today. Had 2 IM rocephin shots and given cipro 500 mg BID, which she has completed 2 days of. Finished the previous antibiotic 3 days before onset. This is her 3rd UTI in the past month and a half. Previously no known hx of UTIs.   Relevant past medical, surgical, family and social history reviewed and updated as indicated. Interim medical history since our last visit reviewed. Allergies and medications reviewed and updated.  Review of Systems  Per HPI unless specifically indicated above     Objective:    BP 123/85   Pulse 77   Temp 98.4 F (36.9 C) (Oral)   Ht 5\' 3"  (1.6 m)   Wt 163 lb 3.2 oz (74 kg)   LMP 09/09/2017   SpO2 99%   BMI 28.91 kg/m   Wt Readings from Last 3 Encounters:  10/04/17 163 lb 3.2 oz (74 kg)  09/13/17 165 lb 12.8 oz (75.2 kg)  08/31/17 167 lb 11.2 oz (76.1 kg)    Physical Exam  Constitutional: She is oriented to person, place, and time. She appears well-developed and well-nourished. No distress.  HENT:  Head: Atraumatic.  Eyes: Conjunctivae and EOM are normal.  Neck: Normal range of motion. Neck supple.  Cardiovascular: Normal rate and regular rhythm.  Pulmonary/Chest: Effort normal and breath sounds normal.  Abdominal: Soft. Bowel sounds are normal. There is no  tenderness.  Musculoskeletal: She exhibits no tenderness (No CVA tenderness on exam b/l today).  Neurological: She is alert and oriented to person, place, and time.  Skin: Skin is warm and dry. No rash noted.  Psychiatric: She has a normal mood and affect. Her behavior is normal.  Nursing note and vitals reviewed.  Results for orders placed or performed in visit on 10/04/17  Urine Culture  Result Value Ref Range   Urine Culture, Routine Final report    Organism ID, Bacteria No growth   Microscopic Examination  Result Value Ref Range   WBC, UA >30 (A) 0 - 5 /hpf   RBC, UA >30 (A) 0 - 2 /hpf   Epithelial Cells (non renal) >10 (A) 0 - 10 /hpf   Renal Epithel, UA 0-10 (A) None seen /hpf   Bacteria, UA Moderate (A) None seen/Few  UA/M w/rflx Culture, Routine  Result Value Ref Range   Specific Gravity, UA >1.030 (H) 1.005 - 1.030   pH, UA 5.5 5.0 - 7.5   Color, UA Yellow Yellow   Appearance Ur Turbid (A) Clear   Leukocytes, UA 1+ (A) Negative   Protein, UA 2+ (A) Negative/Trace   Glucose, UA Negative Negative  Ketones, UA Negative Negative   RBC, UA 3+ (A) Negative   Bilirubin, UA Negative Negative   Urobilinogen, Ur 0.2 0.2 - 1.0 mg/dL   Nitrite, UA Negative Negative   Microscopic Examination See below:   CBC With Differential/Platelet  Result Value Ref Range   WBC 9.9 3.4 - 10.8 x10E3/uL   RBC 4.88 3.77 - 5.28 x10E6/uL   Hemoglobin 12.2 11.1 - 15.9 g/dL   Hematocrit 40.938.0 81.134.0 - 46.6 %   MCV 78 (L) 79 - 97 fL   MCH 25.0 (L) 26.6 - 33.0 pg   MCHC 32.1 31.5 - 35.7 g/dL   RDW 91.415.3 78.212.3 - 95.615.4 %   Platelets 376 150 - 450 x10E3/uL   Neutrophils 59 Not Estab. %   Lymphs 26 Not Estab. %   MID 15 Not Estab. %   Neutrophils Absolute 5.9 1.4 - 7.0 x10E3/uL   Lymphocytes Absolute 2.6 0.7 - 3.1 x10E3/uL   MID (Absolute) 1.4 0.1 - 1.6 X10E3/uL      Assessment & Plan:   Problem List Items Addressed This Visit    None    Visit Diagnoses    Pyelonephritis    -  Primary   U/A  still showing infection,CBC normalized. Await cx, start additional abx for added coverage. Augmentin sent, continue cipro. Strict return precautions given   Relevant Orders   Ambulatory referral to Urology   Acute right-sided low back pain without sciatica       Relevant Medications   acetaminophen (TYLENOL) 500 MG tablet   Other Relevant Orders   UA/M w/rflx Culture, Routine (Completed)   Urine Culture (Completed)   CBC With Differential/Platelet (Completed)   Recurrent UTI       Will refer to urology given significant frequency of UTIs lately without explanation and despite appropriate treatment   Relevant Orders   Ambulatory referral to Urology       Follow up plan: Return if symptoms worsen or fail to improve.

## 2017-10-06 LAB — URINE CULTURE: Organism ID, Bacteria: NO GROWTH

## 2017-10-08 LAB — CBC AND DIFFERENTIAL
HEMATOCRIT: 39 (ref 36–46)
HEMOGLOBIN: 12.6 (ref 12.0–16.0)
PLATELETS: 398 (ref 150–399)
WBC: 17.7

## 2017-10-08 LAB — BASIC METABOLIC PANEL
GLUCOSE: 114
POTASSIUM: 4.1 (ref 3.4–5.3)
Sodium: 141 (ref 137–147)

## 2017-10-09 NOTE — Patient Instructions (Signed)
Follow up as needed

## 2017-10-25 ENCOUNTER — Ambulatory Visit: Payer: Commercial Managed Care - PPO | Admitting: Family Medicine

## 2017-10-25 ENCOUNTER — Encounter: Payer: Self-pay | Admitting: Family Medicine

## 2017-10-25 VITALS — BP 118/77 | HR 65 | Temp 97.8°F | Ht 63.0 in | Wt 164.3 lb

## 2017-10-25 DIAGNOSIS — N390 Urinary tract infection, site not specified: Secondary | ICD-10-CM | POA: Diagnosis not present

## 2017-10-25 DIAGNOSIS — R109 Unspecified abdominal pain: Secondary | ICD-10-CM

## 2017-10-25 LAB — MICROSCOPIC EXAMINATION
RBC, UA: >30 /hpf — AB (ref 0–2)
WBC, UA: >30 /hpf — AB (ref 0–5)

## 2017-10-25 LAB — UA/M W/RFLX CULTURE, ROUTINE
BILIRUBIN UA: NEGATIVE
Glucose, UA: NEGATIVE
KETONES UA: NEGATIVE
Nitrite, UA: NEGATIVE
SPEC GRAV UA: 1.02 (ref 1.005–1.030)
Urobilinogen, Ur: 0.2 mg/dL (ref 0.2–1.0)
pH, UA: 5.5 (ref 5.0–7.5)

## 2017-10-25 MED ORDER — TAMSULOSIN HCL 0.4 MG PO CAPS: 0.4000 mg | ORAL_CAPSULE | Freq: Every day | ORAL | 3 refills | Status: DC

## 2017-10-25 MED ORDER — SULFAMETHOXAZOLE-TRIMETHOPRIM 800-160 MG PO TABS: 1.0000 | ORAL_TABLET | Freq: Two times a day (BID) | ORAL | 0 refills | Status: DC

## 2017-10-25 NOTE — Progress Notes (Signed)
BP 118/77 (BP Location: Right Arm, Patient Position: Sitting, Cuff Size: Normal)   Pulse 65   Temp 97.8 F (36.6 C) (Oral)   Ht 5\' 3"  (1.6 m)   Wt 164 lb 4.8 oz (74.5 kg)   SpO2 100%   BMI 29.10 kg/m    Subjective:    Patient ID: Candice Robinson Jian, female    DOB: 1970/04/06, 47 y.o.   MRN: [id number]  HPI: Candice Robinson Shumard is a 47 y.o. female  Chief Complaint  Patient presents with  . Flank Pain  . Change in Urine Color   Here today for recurring right flank pain, discolored urine, fatigue/malaise, dysuria the past few days. Improved initially after last round of abx. Now slowly starting to feel the pain in right flank again uch less than previously when diagnosed with pyelo but still there. Has made significant dietary changes, cutting out sodas and sweet teas and caffeine. Is about to restart probiotics as well. Denies fevers, chills, N/V/D, gross hematuria. Has upcoming appt with Urology next week for these new onset recurrent UTIs.   Relevant past medical, surgical, family and social history reviewed and updated as indicated. Interim medical history since our last visit reviewed. Allergies and medications reviewed and updated.  Review of Systems  Per HPI unless specifically indicated above     Objective:    BP 118/77 (BP Location: Right Arm, Patient Position: Sitting, Cuff Size: Normal)   Pulse 65   Temp 97.8 F (36.6 C) (Oral)   Ht 5\' 3"  (1.6 m)   Wt 164 lb 4.8 oz (74.5 kg)   SpO2 100%   BMI 29.10 kg/m   Wt Readings from Last 3 Encounters:  10/25/17 164 lb 4.8 oz (74.5 kg)  10/04/17 163 lb 3.2 oz (74 kg)  09/13/17 165 lb 12.8 oz (75.2 kg)    Physical Exam  Constitutional: She is oriented to person, place, and time. She appears well-developed and well-nourished.  HENT:  Head: Atraumatic.  Eyes: Conjunctivae and EOM are normal.  Neck: Normal range of motion. Neck supple.  Cardiovascular: Normal rate and regular rhythm.  Pulmonary/Chest: Effort normal and  breath sounds normal.  Abdominal: Soft. Bowel sounds are normal. There is no tenderness.  Musculoskeletal: Normal range of motion. She exhibits tenderness (right CVA ttp).  Neurological: She is alert and oriented to person, place, and time.  Skin: Skin is warm and dry.  Psychiatric: She has a normal mood and affect. Her behavior is normal.  Nursing note and vitals reviewed.   Results for orders placed or performed in visit on 10/25/17  Urine Culture  Result Value Ref Range   Urine Culture, Routine Final report    Organism ID, Bacteria Comment   Microscopic Examination  Result Value Ref Range   WBC, UA >30 (A) 0 - 5 /hpf   RBC, UA >30 (A) 0 - 2 /hpf   Epithelial Cells (non renal) 0-10 0 - 10 /hpf   Mucus, UA Present Not Estab.   Bacteria, UA Few None seen/Few   Yeast, UA Present None seen  UA/M w/rflx Culture, Routine  Result Value Ref Range   Specific Gravity, UA 1.020 1.005 - 1.030   pH, UA 5.5 5.0 - 7.5   Color, UA Yellow Yellow   Appearance Ur Turbid (A) Clear   Leukocytes, UA 2+ (A) Negative   Protein, UA 2+ (A) Negative/Trace   Glucose, UA Negative Negative   Ketones, UA Negative Negative   RBC, UA 3+ (A) Negative  Bilirubin, UA Negative Negative   Urobilinogen, Ur 0.2 0.2 - 1.0 mg/dL   Nitrite, UA Negative Negative   Microscopic Examination See below:       Assessment & Plan:   Problem List Items Addressed This Visit    None    Visit Diagnoses    Flank pain    -  Primary   Relevant Orders   UA/M w/rflx Culture, Routine (Completed)   Urine Culture (Completed)   Microscopic Examination (Completed)   Acute lower UTI       Relevant Medications   sulfamethoxazole-trimethoprim (BACTRIM DS,SEPTRA DS) 800-160 MG tablet   Other Relevant Orders   UA/M w/rflx Culture, Routine (Completed)    U/A positive for infection as well as 3+ RBC. Given recurring localized right flank pain, possibly a stone lodged that could be causing these new recurrent infections. Pt  wanting to hold off on stone imaging until seeing Urology next week. Will start flomax, bactrim, and probiotics. Push fluids. F/u if sxs worsening, await urine culture.   Follow up plan: Return if symptoms worsen or fail to improve.

## 2017-10-27 LAB — URINE CULTURE

## 2017-10-28 NOTE — Patient Instructions (Signed)
Follow up as needed

## 2017-11-02 ENCOUNTER — Encounter: Payer: Self-pay | Admitting: Urology

## 2017-11-02 ENCOUNTER — Ambulatory Visit: Payer: Commercial Managed Care - PPO | Admitting: Urology

## 2017-11-02 ENCOUNTER — Ambulatory Visit
Admission: RE | Admit: 2017-11-02 | Discharge: 2017-11-02 | Disposition: A | Discharge status: Home / Self Care | Payer: Commercial Managed Care - PPO | Source: Ambulatory Visit | Attending: Urology | Admitting: Urology

## 2017-11-02 VITALS — BP 134/81 | HR 73 | Ht 63.0 in | Wt 161.7 lb

## 2017-11-02 DIAGNOSIS — R1031 Right lower quadrant pain: Secondary | ICD-10-CM

## 2017-11-02 DIAGNOSIS — N200 Calculus of kidney: Secondary | ICD-10-CM | POA: Diagnosis not present

## 2017-11-02 DIAGNOSIS — R109 Unspecified abdominal pain: Secondary | ICD-10-CM

## 2017-11-02 LAB — MICROSCOPIC EXAMINATION

## 2017-11-02 LAB — URINALYSIS, COMPLETE
Bilirubin, UA: NEGATIVE
Glucose, UA: NEGATIVE
KETONES UA: NEGATIVE
NITRITE UA: NEGATIVE
SPEC GRAV UA: 1.02 (ref 1.005–1.030)
UUROB: 0.2 mg/dL (ref 0.2–1.0)
pH, UA: 6 (ref 5.0–7.5)

## 2017-11-02 NOTE — Consult Note (Addendum)
11/02/2017 9:28 AM   Candice Robinson Candice Robinson 1970/08/08 [id number]  Referring provider: Dorcas Carrow, DO [address] [place], [place] [address]  CC: "Recurrent UTIs"  HPI: I had the pleasure of seeing Candice Robinson today in urology clinic in consultation of recurrent UTIs from Dr. Laural Benes.  She initially presented to her primary in June with low right-sided flank pain.  The duration is since June 2019, the character is intermittent and sharp, severity is moderate.  She was treated with antibiotics for presumed UTI at that time, however she reports she never had any urinary symptoms including urgency, frequency, dysuria, or fever.  However, after being treated with antibiotics she said she developed some lower urinary tract symptoms including burning with urination.  On review of her cultures from the last year the only 2 that are positive are 7/2 and 7/15 for greater than 100K of E. Coli.  She was also seen at the emergency department in [place] over the summer for continued low right-sided flank pain and was diagnosed with pyelonephritis at that time.  The notes from this visit are unavailable to me.  Again, at no point has she had any fever.  She has not had any cross-sectional imaging.  She does not have a personal history of kidney stones, though her father had kidney stones.  She denies any gross hematuria.  She denies history of recurrent urinary infections.  Finally, she denies any current urinary symptoms currently.   PMH: History reviewed. No pertinent past medical history.  Surgical History: Past Surgical History:  Procedure Laterality Date  . TUBAL LIGATION  1996  . tummy tuck  2004    Allergies:  Allergies  Allergen Reactions  . Codeine Other (See Comments)    Hallucinations    Family History: Family History  Problem Relation Age of Onset  . Diabetes Mother   . Hyperlipidemia Mother   . Hypertension Mother   . Hyperlipidemia Father   . Hypertension Father   . Heart disease  Father   . Lupus Sister   . Diabetes Sister   . Hypertension Sister   . Breast cancer Maternal Aunt   . Breast cancer Maternal Aunt   . Bladder Cancer Neg Hx   . Kidney cancer Neg Hx     Social History:  reports that she has never smoked. She has never used smokeless tobacco. She reports that she drinks alcohol. She reports that she does not use drugs.  ROS: Please see flowsheet from today's date for complete review of systems.  Physical Exam: BP 134/81 (BP Location: Left Arm, Patient Position: Sitting, Cuff Size: Normal)   Pulse 73   Ht 5\' 3"  (1.6 m)   Wt 161 lb 11.2 oz (73.3 kg)   BMI 28.64 kg/m    Constitutional:  Alert and oriented, No acute distress. Cardiovascular: No clubbing, cyanosis, or edema. Respiratory: Normal respiratory effort, no increased work of breathing. GI: Abdomen is soft, nontender, nondistended, no abdominal masses GU: Right CVA tenderness Lymph: No cervical or inguinal lymphadenopathy. Skin: No rashes, bruises or suspicious lesions. Neurologic: Grossly intact, no focal deficits, moving all 4 extremities. Psychiatric: Normal mood and affect.  Laboratory Data: Urinalysis today 11-30 WBCs, 11-30 RBCs, 0-10 epithelial cells, moderate bacteria, nitrite negative  Pertinent Imaging: None to review  Assessment & Plan:   In summary, Candice Robinson is a healthy 47 year old female who initially presented in June 2019 with low right-sided flank pain.  She was treated for a presumed UTI, however she  did not have any urinary symptoms or fever at that time.  She has taken numerous courses of antibiotics over the summer.  Her symptoms are more consistent with either renal colic secondary to a stone, or musculoskeletal pain.  I counseled her that we do not recommend treating asymptomatic bacteriuria with antibiotics, and recommended that if she develops lower urinary symptoms she should present to our clinic and drop off a urine sample for analysis and culture.  Finally, I  recommended CT stone protocol today to rule out right ureteral stone.  I will call her with the results of the scan.  If there is no stone present, I would recommend general UTI prevention strategies including adequate hydration, cranberry supplementation, and urinary hygiene.  If a stone is seen, we will discuss surgical options, as she has had pain over the last 3 months.  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  I discussed these findings over the phone with Candice Robinson today.  Recommend right PCNL, likely staged procedure.  We discussed the risks and benefits including bleeding, infection, damage to surrounding structures, and need for additional procedures.  Also will order CT abdomen with contrast to evaluate parenchymal calcifications in lower pole, and rule out renal mass.  Follow-up in clinic in 2 weeks to discuss new CT with contrast, and review PCNL in person.    Sondra Come, MD  [facility] [address], [address] [place], [place] [address] [phone]

## 2017-11-02 NOTE — Addendum Note (Signed)
Addended by: Sondra Come on: 11/02/2017 12:53 PM   Modules accepted: Orders

## 2017-11-05 ENCOUNTER — Telehealth: Payer: Self-pay | Admitting: Urology

## 2017-11-05 NOTE — Telephone Encounter (Signed)
Pt lmom stating that she has CT scheduled on 9/10, states Dr. Aleene Davidson wanted CT with contrast, however when pt went to pick up prep, she was told she would not be getting "dye" pt wants to know is she to have CT with "dye" or not. Please advise. Thanks.

## 2017-11-09 ENCOUNTER — Ambulatory Visit
Admission: RE | Admit: 2017-11-09 | Discharge: 2017-11-09 | Disposition: A | Discharge status: Home / Self Care | Payer: Commercial Managed Care - PPO | Source: Ambulatory Visit | Attending: Urology | Admitting: Urology

## 2017-11-09 DIAGNOSIS — N200 Calculus of kidney: Secondary | ICD-10-CM | POA: Diagnosis not present

## 2017-11-09 DIAGNOSIS — I700 Atherosclerosis of aorta: Secondary | ICD-10-CM | POA: Insufficient documentation

## 2017-11-09 DIAGNOSIS — R109 Unspecified abdominal pain: Secondary | ICD-10-CM | POA: Insufficient documentation

## 2017-11-09 DIAGNOSIS — N1330 Unspecified hydronephrosis: Secondary | ICD-10-CM | POA: Diagnosis not present

## 2017-11-09 MED ORDER — IOPAMIDOL (ISOVUE-300) INJECTION 61%
100.0000 mL | Freq: Once | INTRAVENOUS | Status: AC | PRN
  Administered 2017-11-09: 100 mL via INTRAVENOUS

## 2017-11-09 NOTE — Telephone Encounter (Signed)
Spoke with patient and explained that we do not want the oral contrast, patient verbalized understanding

## 2017-11-10 ENCOUNTER — Other Ambulatory Visit: Payer: Self-pay | Admitting: Radiology

## 2017-11-10 DIAGNOSIS — N200 Calculus of kidney: Secondary | ICD-10-CM

## 2017-11-12 ENCOUNTER — Other Ambulatory Visit: Payer: Self-pay | Admitting: Radiology

## 2017-11-16 ENCOUNTER — Ambulatory Visit (INDEPENDENT_AMBULATORY_CARE_PROVIDER_SITE_OTHER): Payer: Commercial Managed Care - PPO | Admitting: Urology

## 2017-11-16 ENCOUNTER — Other Ambulatory Visit: Payer: Self-pay

## 2017-11-16 ENCOUNTER — Encounter: Payer: Self-pay | Admitting: Urology

## 2017-11-16 ENCOUNTER — Other Ambulatory Visit: Payer: Self-pay | Admitting: Radiology

## 2017-11-16 VITALS — BP 127/80 | HR 76 | Ht 63.0 in | Wt 163.6 lb

## 2017-11-16 DIAGNOSIS — N200 Calculus of kidney: Secondary | ICD-10-CM

## 2017-11-16 NOTE — Progress Notes (Signed)
   11/16/2017 11:45 AM   Candice Robinson 07/04/1970 [id number]  Reason for visit: Discuss R PCNL  At the pleasure of seeing Candice Robinson in urology clinic today to discuss right staghorn calculus.  She is a healthy 47 year old female who was found to have a right full staghorn calculus on CT scan.  Also, there is some subtle calcification in the lower pole which on contrasted imaging appears to just be stone debris, and not a renal mass.  We had a long discussion today about recommendation for right PCNL.  I discussed at length that this would likely require a staged approach with 2 procedures secondary to her stone volume and anatomy.  We discussed the risks and benefits including bleeding, infection, possible need for additional procedures, pneumothorax, and damage to surrounding structures.  We also discussed the importance of stone prevention, and we will follow-up her stone analysis obtain Intra-Op to help guide prevention strategies in the future.  Plan Schedule right PCNL  Greater than 15 minutes were spent in direct patient education and counseling regarding right staghorn calculus and plan for right PCNL.  Sondra ComeBrian C Rylin Seavey, MD  [facility] [address], [address] [place], [place] [address] [phone]

## 2017-11-25 ENCOUNTER — Other Ambulatory Visit: Payer: Self-pay

## 2017-11-25 ENCOUNTER — Encounter
Admission: RE | Admit: 2017-11-25 | Discharge: 2017-11-25 | Disposition: A | Discharge status: Home / Self Care | Payer: Commercial Managed Care - PPO | Source: Ambulatory Visit | Attending: Urology | Admitting: Urology

## 2017-11-25 DIAGNOSIS — Z01812 Encounter for preprocedural laboratory examination: Secondary | ICD-10-CM | POA: Insufficient documentation

## 2017-11-25 HISTORY — DX: Calculus of kidney: N20.0

## 2017-11-25 LAB — URINALYSIS, ROUTINE W REFLEX MICROSCOPIC
BILIRUBIN URINE: NEGATIVE
Glucose, UA: NEGATIVE mg/dL
Ketones, ur: NEGATIVE mg/dL
Nitrite: NEGATIVE
PROTEIN: NEGATIVE mg/dL
SPECIFIC GRAVITY, URINE: 1.005 (ref 1.005–1.030)
WBC, UA: >50 WBC/hpf — ABNORMAL HIGH (ref 0–5)
pH: 6 (ref 5.0–8.0)

## 2017-11-25 NOTE — Patient Instructions (Signed)
Your procedure is scheduled on: 12/03/17 Fri Report to Same Day Surgery 2nd floor medical mall [facility] Entrance-take elevator on left to 2nd floor.  Check in with surgery information desk.) To find out your arrival time please call [phone] between 1PM - 3PM on 12/02/17 Thur  Remember: Instructions that are not followed completely may result in serious medical risk, up to and including death, or upon the discretion of your surgeon and anesthesiologist your surgery may need to be rescheduled.    _x___ 1. Do not eat food after midnight the night before your procedure. You may drink clear liquids up to 2 hours before you are scheduled to arrive at the hospital for your procedure.  Do not drink clear liquids within 2 hours of your scheduled arrival to the hospital.  Clear liquids include  --Water or Apple juice without pulp  --Clear carbohydrate beverage such as ClearFast or Gatorade  --Black Coffee or Clear Tea (No milk, no creamers, do not add anything to                  the coffee or Tea Type 1 and type 2 diabetics should only drink water.   ____Ensure clear carbohydrate drink on the way to the hospital for bariatric patients  ____Ensure clear carbohydrate drink 3 hours before surgery for Dr Rutherford Nail patients if physician instructed.   No gum chewing or hard candies.     __x__ 2. No Alcohol for 24 hours before or after surgery.   __x__3. No Smoking or e-cigarettes for 24 prior to surgery.  Do not use any chewable tobacco products for at least 6 hour prior to surgery   ____  4. Bring all medications with you on the day of surgery if instructed.    __x__ 5. Notify your doctor if there is any change in your medical condition     (cold, fever, infections).    x___6. On the morning of surgery brush your teeth with toothpaste and water.  You may rinse your mouth with mouth wash if you wish.  Do not swallow any toothpaste or mouthwash.   Do not wear jewelry, make-up, hairpins,  clips or nail polish.  Do not wear lotions, powders, or perfumes. You may wear deodorant.  Do not shave 48 hours prior to surgery. Men may shave face and neck.  Do not bring valuables to the hospital.    [facility] is not responsible for any belongings or valuables.               Contacts, dentures or bridgework may not be worn into surgery.  Leave your suitcase in the car. After surgery it may be brought to your room.  For patients admitted to the hospital, discharge time is determined by your                       treatment team.  _  Patients discharged the day of surgery will not be allowed to drive home.  You will need someone to drive you home and stay with you the night of your procedure.    Please read over the following fact sheets that you were given:   [facility] Preparing for Surgery and or MRSA Information   _x___ Take anti-hypertensive listed below, cardiac, seizure, asthma,     anti-reflux and psychiatric medicines. These include:  1. None  2.  3.  4.  5.  6.  ____Fleets enema or Magnesium Citrate as directed.  _x___ Use CHG Soap or sage wipes as directed on instruction sheet   ____ Use inhalers on the day of surgery and bring to hospital day of surgery  ____ Stop Metformin and Janumet 2 days prior to surgery.    ____ Take 1/2 of usual insulin dose the night before surgery and none on the morning     surgery.   _x___ Follow recommendations from Cardiologist, Pulmonologist or PCP regarding          stopping Aspirin, Coumadin, Plavix ,Eliquis, Effient, or Pradaxa, and Pletal.  X____Stop Anti-inflammatories such as Advil, Aleve, Ibuprofen, Motrin, Naproxen, Naprosyn, Goodies powders or aspirin products. OK to take Tylenol and                          Celebrex.   _x___ Stop supplements until after surgery.  But may continue Vitamin D, Vitamin B,       and multivitamin.   ____ Bring C-Pap to the hospital.    

## 2017-11-26 LAB — URINE CULTURE: CULTURE: NO GROWTH

## 2017-12-02 ENCOUNTER — Other Ambulatory Visit: Payer: Self-pay | Admitting: Radiology

## 2017-12-02 MED ORDER — CEFAZOLIN SODIUM-DEXTROSE 1-4 GM/50ML-% IV SOLN
1.0000 g | INTRAVENOUS | Status: AC
  Administered 2017-12-03: 2 g via INTRAVENOUS

## 2017-12-03 ENCOUNTER — Other Ambulatory Visit: Payer: Self-pay

## 2017-12-03 ENCOUNTER — Encounter: Payer: Self-pay | Admitting: *Deleted

## 2017-12-03 ENCOUNTER — Ambulatory Visit
Admission: RE | Admit: 2017-12-03 | Discharge: 2017-12-03 | Disposition: A | Discharge status: Home / Self Care | Payer: Commercial Managed Care - PPO | Source: Ambulatory Visit | Attending: Urology | Admitting: Urology

## 2017-12-03 ENCOUNTER — Ambulatory Visit: Payer: Commercial Managed Care - PPO

## 2017-12-03 ENCOUNTER — Observation Stay: Payer: Commercial Managed Care - PPO

## 2017-12-03 ENCOUNTER — Observation Stay
Admission: RE | Admit: 2017-12-03 | Discharge: 2017-12-05 | Disposition: A | Discharge status: Home / Self Care | Payer: Commercial Managed Care - PPO | Source: Ambulatory Visit | Attending: Urology | Admitting: Urology

## 2017-12-03 ENCOUNTER — Ambulatory Visit: Payer: Commercial Managed Care - PPO | Admitting: Anesthesiology

## 2017-12-03 ENCOUNTER — Encounter
Admission: RE | Disposition: A | Discharge status: Home / Self Care | Payer: Self-pay | Source: Ambulatory Visit | Attending: Urology

## 2017-12-03 DIAGNOSIS — E669 Obesity, unspecified: Secondary | ICD-10-CM | POA: Diagnosis not present

## 2017-12-03 DIAGNOSIS — Z6829 Body mass index (BMI) 29.0-29.9, adult: Secondary | ICD-10-CM | POA: Insufficient documentation

## 2017-12-03 DIAGNOSIS — Z885 Allergy status to narcotic agent status: Secondary | ICD-10-CM | POA: Insufficient documentation

## 2017-12-03 DIAGNOSIS — N200 Calculus of kidney: Secondary | ICD-10-CM | POA: Diagnosis present

## 2017-12-03 DIAGNOSIS — Z8489 Family history of other specified conditions: Secondary | ICD-10-CM | POA: Insufficient documentation

## 2017-12-03 DIAGNOSIS — Z91018 Allergy to other foods: Secondary | ICD-10-CM | POA: Insufficient documentation

## 2017-12-03 DIAGNOSIS — Z87442 Personal history of urinary calculi: Secondary | ICD-10-CM | POA: Insufficient documentation

## 2017-12-03 DIAGNOSIS — Z833 Family history of diabetes mellitus: Secondary | ICD-10-CM | POA: Insufficient documentation

## 2017-12-03 DIAGNOSIS — Z8249 Family history of ischemic heart disease and other diseases of the circulatory system: Secondary | ICD-10-CM | POA: Insufficient documentation

## 2017-12-03 DIAGNOSIS — Z803 Family history of malignant neoplasm of breast: Secondary | ICD-10-CM | POA: Diagnosis not present

## 2017-12-03 DIAGNOSIS — R109 Unspecified abdominal pain: Secondary | ICD-10-CM

## 2017-12-03 DIAGNOSIS — J939 Pneumothorax, unspecified: Secondary | ICD-10-CM

## 2017-12-03 DIAGNOSIS — Z419 Encounter for procedure for purposes other than remedying health state, unspecified: Secondary | ICD-10-CM

## 2017-12-03 HISTORY — PX: NEPHROLITHOTOMY: SHX5134

## 2017-12-03 HISTORY — PX: IR NEPHROSTOMY PLACEMENT RIGHT: IMG6064

## 2017-12-03 HISTORY — PX: HOLMIUM LASER APPLICATION: SHX5852

## 2017-12-03 HISTORY — PX: CYSTOSCOPY WITH STENT PLACEMENT: SHX5790

## 2017-12-03 LAB — BASIC METABOLIC PANEL
ANION GAP: 7 (ref 5–15)
BUN: 13 mg/dL (ref 6–20)
CALCIUM: 8.8 mg/dL — AB (ref 8.9–10.3)
CO2: 27 mmol/L (ref 22–32)
CREATININE: 0.94 mg/dL (ref 0.44–1.00)
Chloride: 106 mmol/L (ref 98–111)
Glucose, Bld: 92 mg/dL (ref 70–99)
Potassium: 4 mmol/L (ref 3.5–5.1)
SODIUM: 140 mmol/L (ref 135–145)

## 2017-12-03 LAB — TYPE AND SCREEN
ABO/RH(D): A POS
ANTIBODY SCREEN: NEGATIVE

## 2017-12-03 LAB — CBC
HCT: 38.1 % (ref 35.0–47.0)
HEMOGLOBIN: 12.7 g/dL (ref 12.0–16.0)
MCH: 24.7 pg — AB (ref 26.0–34.0)
MCHC: 33.2 g/dL (ref 32.0–36.0)
MCV: 74.4 fL — ABNORMAL LOW (ref 80.0–100.0)
PLATELETS: 285 10*3/uL (ref 150–440)
RBC: 5.12 MIL/uL (ref 3.80–5.20)
RDW: 16.8 % — ABNORMAL HIGH (ref 11.5–14.5)
WBC: 6.1 10*3/uL (ref 3.6–11.0)

## 2017-12-03 LAB — POCT PREGNANCY, URINE: Preg Test, Ur: NEGATIVE

## 2017-12-03 LAB — PROTIME-INR
INR: 1.05
PROTHROMBIN TIME: 13.6 s (ref 11.4–15.2)

## 2017-12-03 LAB — ABO/RH: ABO/RH(D): A POS

## 2017-12-03 SURGERY — NEPHROLITHOTOMY PERCUTANEOUS
Anesthesia: General | Site: Ureter | Laterality: Right

## 2017-12-03 MED ORDER — SODIUM CHLORIDE 0.9 % IV SOLN
INTRAVENOUS | Status: DC
  Administered 2017-12-03: 07:00:00 via INTRAVENOUS

## 2017-12-03 MED ORDER — LACTATED RINGERS IV SOLN
INTRAVENOUS | Status: DC
  Administered 2017-12-03: 10:00:00 via INTRAVENOUS

## 2017-12-03 MED ORDER — MIDAZOLAM HCL 2 MG/2ML IJ SOLN
INTRAMUSCULAR | Status: AC
  Filled 2017-12-03: qty 2

## 2017-12-03 MED ORDER — CIPROFLOXACIN IN D5W 400 MG/200ML IV SOLN
400.0000 mg | INTRAVENOUS | Status: AC
  Administered 2017-12-03: 400 mg via INTRAVENOUS

## 2017-12-03 MED ORDER — FENTANYL CITRATE (PF) 100 MCG/2ML IJ SOLN
INTRAMUSCULAR | Status: AC | PRN
  Administered 2017-12-03: 50 ug via INTRAVENOUS

## 2017-12-03 MED ORDER — MIDAZOLAM HCL 5 MG/5ML IJ SOLN
INTRAMUSCULAR | Status: AC | PRN
  Administered 2017-12-03 (×2): 1 mg via INTRAVENOUS

## 2017-12-03 MED ORDER — ONDANSETRON HCL 4 MG/2ML IJ SOLN: 4.0000 mg | INTRAMUSCULAR | Status: DC | PRN

## 2017-12-03 MED ORDER — FENTANYL CITRATE (PF) 100 MCG/2ML IJ SOLN: 25.0000 ug | INTRAMUSCULAR | Status: DC | PRN

## 2017-12-03 MED ORDER — FAMOTIDINE 20 MG PO TABS
ORAL_TABLET | ORAL | Status: AC
  Filled 2017-12-03: qty 1

## 2017-12-03 MED ORDER — PROMETHAZINE HCL 25 MG/ML IJ SOLN
12.5000 mg | Freq: Once | INTRAMUSCULAR | Status: AC
  Administered 2017-12-03: 12.5 mg via INTRAVENOUS

## 2017-12-03 MED ORDER — SODIUM CHLORIDE FLUSH 0.9 % IV SOLN
INTRAVENOUS | Status: AC
  Filled 2017-12-03: qty 10

## 2017-12-03 MED ORDER — HYDROMORPHONE HCL 1 MG/ML IJ SOLN
INTRAMUSCULAR | Status: AC
  Filled 2017-12-03: qty 1

## 2017-12-03 MED ORDER — MEPERIDINE HCL 50 MG/ML IJ SOLN: 6.2500 mg | INTRAMUSCULAR | Status: DC | PRN

## 2017-12-03 MED ORDER — LIDOCAINE HCL (PF) 1 % IJ SOLN
INTRAMUSCULAR | Status: AC | PRN
  Administered 2017-12-03: 10 mL

## 2017-12-03 MED ORDER — CEFAZOLIN SODIUM-DEXTROSE 1-4 GM/50ML-% IV SOLN
INTRAVENOUS | Status: AC
  Filled 2017-12-03: qty 50

## 2017-12-03 MED ORDER — DIPHENHYDRAMINE HCL 25 MG PO CAPS: 25.0000 mg | ORAL_CAPSULE | Freq: Every evening | ORAL | Status: DC | PRN

## 2017-12-03 MED ORDER — PROMETHAZINE HCL 25 MG/ML IJ SOLN
6.2500 mg | INTRAMUSCULAR | Status: DC | PRN
  Administered 2017-12-03: 12.5 mg via INTRAVENOUS

## 2017-12-03 MED ORDER — OXYCODONE HCL 5 MG/5ML PO SOLN: 5.0000 mg | Freq: Once | ORAL | Status: DC | PRN

## 2017-12-03 MED ORDER — ONDANSETRON HCL 4 MG/2ML IJ SOLN
4.0000 mg | Freq: Once | INTRAMUSCULAR | Status: AC
  Administered 2017-12-03: 4 mg via INTRAVENOUS
  Filled 2017-12-03: qty 2

## 2017-12-03 MED ORDER — PROPOFOL 10 MG/ML IV BOLUS
INTRAVENOUS | Status: DC | PRN
  Administered 2017-12-03: 150 mg via INTRAVENOUS

## 2017-12-03 MED ORDER — HYDROCODONE-ACETAMINOPHEN 5-325 MG PO TABS: 1.0000 | ORAL_TABLET | ORAL | 0 refills | Status: DC | PRN

## 2017-12-03 MED ORDER — PROPOFOL 10 MG/ML IV BOLUS
INTRAVENOUS | Status: AC
  Filled 2017-12-03: qty 20

## 2017-12-03 MED ORDER — ACETAMINOPHEN 325 MG PO TABS
650.0000 mg | ORAL_TABLET | Freq: Four times a day (QID) | ORAL | Status: DC
  Administered 2017-12-03 – 2017-12-05 (×7): 650 mg via ORAL
  Filled 2017-12-03 (×8): qty 2

## 2017-12-03 MED ORDER — FENTANYL CITRATE (PF) 100 MCG/2ML IJ SOLN
INTRAMUSCULAR | Status: DC | PRN
  Administered 2017-12-03 (×2): 50 ug via INTRAVENOUS

## 2017-12-03 MED ORDER — HYDROMORPHONE HCL 1 MG/ML IJ SOLN
0.5000 mg | INTRAMUSCULAR | Status: DC | PRN
  Administered 2017-12-04: 0.5 mg via INTRAVENOUS
  Filled 2017-12-03: qty 1

## 2017-12-03 MED ORDER — KCL IN DEXTROSE-NACL 20-5-0.45 MEQ/L-%-% IV SOLN
INTRAVENOUS | Status: DC
  Administered 2017-12-03 – 2017-12-04 (×2): via INTRAVENOUS
  Filled 2017-12-03 (×4): qty 1000

## 2017-12-03 MED ORDER — IOPAMIDOL (ISOVUE-300) INJECTION 61%
50.0000 mL | Freq: Once | INTRAVENOUS | Status: AC | PRN
  Administered 2017-12-03: 10 mL

## 2017-12-03 MED ORDER — FAMOTIDINE 20 MG PO TABS
20.0000 mg | ORAL_TABLET | Freq: Once | ORAL | Status: AC
  Administered 2017-12-03: 20 mg via ORAL

## 2017-12-03 MED ORDER — OXYCODONE HCL 5 MG PO TABS: 5.0000 mg | ORAL_TABLET | Freq: Once | ORAL | Status: DC | PRN

## 2017-12-03 MED ORDER — SODIUM CHLORIDE 0.9% FLUSH: 5.0000 mL | Freq: Three times a day (TID) | INTRAVENOUS | Status: DC

## 2017-12-03 MED ORDER — ROCURONIUM BROMIDE 100 MG/10ML IV SOLN
INTRAVENOUS | Status: DC | PRN
  Administered 2017-12-03: 10 mg via INTRAVENOUS
  Administered 2017-12-03: 50 mg via INTRAVENOUS

## 2017-12-03 MED ORDER — DEXAMETHASONE SODIUM PHOSPHATE 10 MG/ML IJ SOLN
INTRAMUSCULAR | Status: DC | PRN
  Administered 2017-12-03: 10 mg via INTRAVENOUS

## 2017-12-03 MED ORDER — LIDOCAINE HCL (CARDIAC) PF 100 MG/5ML IV SOSY
PREFILLED_SYRINGE | INTRAVENOUS | Status: DC | PRN
  Administered 2017-12-03: 30 mg via INTRAVENOUS

## 2017-12-03 MED ORDER — OXYCODONE HCL 5 MG PO TABS
5.0000 mg | ORAL_TABLET | ORAL | Status: DC | PRN
  Administered 2017-12-04 – 2017-12-05 (×3): 5 mg via ORAL
  Filled 2017-12-03 (×4): qty 1

## 2017-12-03 MED ORDER — MIDAZOLAM HCL 2 MG/2ML IJ SOLN
INTRAMUSCULAR | Status: DC | PRN
  Administered 2017-12-03: 2 mg via INTRAVENOUS

## 2017-12-03 MED ORDER — SUGAMMADEX SODIUM 200 MG/2ML IV SOLN
INTRAVENOUS | Status: DC | PRN
  Administered 2017-12-03: 100 mg via INTRAVENOUS

## 2017-12-03 MED ORDER — PROMETHAZINE HCL 25 MG/ML IJ SOLN
INTRAMUSCULAR | Status: AC
  Administered 2017-12-03: 12.5 mg via INTRAVENOUS
  Filled 2017-12-03: qty 1

## 2017-12-03 MED ORDER — HYDROMORPHONE HCL 1 MG/ML IJ SOLN
INTRAMUSCULAR | Status: DC | PRN
  Administered 2017-12-03 (×2): 0.5 mg via INTRAVENOUS

## 2017-12-03 MED ORDER — FENTANYL CITRATE (PF) 100 MCG/2ML IJ SOLN
INTRAMUSCULAR | Status: AC
  Filled 2017-12-03: qty 2

## 2017-12-03 MED ORDER — ONDANSETRON HCL 4 MG/2ML IJ SOLN
INTRAMUSCULAR | Status: DC | PRN
  Administered 2017-12-03: 4 mg via INTRAVENOUS

## 2017-12-03 SURGICAL SUPPLY — 75 items
ADAPTER IRRIG TUBE 2 SPIKE SOL (ADAPTER) ×10 IMPLANT
ADAPTER SCOPE UROLOK II (MISCELLANEOUS) ×5 IMPLANT
BAG URINE DRAINAGE (UROLOGICAL SUPPLIES) ×5 IMPLANT
BALLN NEPHROSTOMY 10MMX15CM (UROLOGICAL SUPPLIES) ×1
BALLN NEPHROSTOMY 10X15 (UROLOGICAL SUPPLIES) ×4 IMPLANT
BASKET ZERO TIP 1.9FR (BASKET) ×2 IMPLANT
BLADE SURG 15 STRL LF DISP TIS (BLADE) ×3 IMPLANT
BLADE SURG 15 STRL SS (BLADE) ×2
CATH COUNCIL 22FR (CATHETERS) IMPLANT
CATH FOLEY 2W COUNCIL 20FR 5CC (CATHETERS) ×2 IMPLANT
CATH FOLEY 2W COUNCIL 5CC 18FR (CATHETERS) IMPLANT
CATH ROBINSON RED A/P 16FR (CATHETERS) ×2 IMPLANT
CATH STENT KAYE NEPHR TAMP (CATHETERS) IMPLANT
CATH URETL 5X70 OPEN END (CATHETERS) ×5 IMPLANT
CATH/STENT KAYE NEPHR TAMP (CATHETERS)
CHLORAPREP W/TINT 26ML (MISCELLANEOUS) ×5 IMPLANT
CNTNR SPEC 2.5X3XGRAD LEK (MISCELLANEOUS) ×3
CONRAY 43 FOR UROLOGY 50M (MISCELLANEOUS) ×15 IMPLANT
CONT SPEC 4OZ STER OR WHT (MISCELLANEOUS) ×2
CONTAINER SPEC 2.5X3XGRAD LEK (MISCELLANEOUS) ×3 IMPLANT
DRAPE C-ARM XRAY 36X54 (DRAPES) ×5 IMPLANT
DRAPE SHEET LG 3/4 BI-LAMINATE (DRAPES) ×5 IMPLANT
DRAPE SURG 17X11 SM STRL (DRAPES) ×20 IMPLANT
FIBER LASER LITHO 273 (Laser) ×2 IMPLANT
GAUZE SPONGE 4X4 12PLY STRL (GAUZE/BANDAGES/DRESSINGS) ×5 IMPLANT
GLIDEWIRE STIFF .35X180X3 HYDR (WIRE) ×5 IMPLANT
GLOVE BIO SURGEON STRL SZ 6.5 (GLOVE) ×4 IMPLANT
GLOVE BIO SURGEON STRL SZ7 (GLOVE) ×10 IMPLANT
GLOVE BIO SURGEONS STRL SZ 6.5 (GLOVE) ×1
GLOVE BIOGEL PI IND STRL 6.5 (GLOVE) ×6 IMPLANT
GLOVE BIOGEL PI INDICATOR 6.5 (GLOVE) ×4
GOWN STRL REUS W/ TWL LRG LVL3 (GOWN DISPOSABLE) ×6 IMPLANT
GOWN STRL REUS W/TWL LRG LVL3 (GOWN DISPOSABLE) ×4
GUIDEWIRE GREEN .038 145CM (MISCELLANEOUS) IMPLANT
GUIDEWIRE INTRO SET STRAIGHT (WIRE) ×5 IMPLANT
GUIDEWIRE STR ZIPWIRE 035X150 (MISCELLANEOUS) IMPLANT
GUIDEWIRE SUPER STIFF (WIRE) IMPLANT
HOLDER FOLEY CATH W/STRAP (MISCELLANEOUS) ×5 IMPLANT
INTRODUCER DILATOR DOUBLE (INTRODUCER) ×2 IMPLANT
MANIFOLD NEPTUNE II (INSTRUMENTS) ×5 IMPLANT
MAT ABSORB  FLUID 56X50 GRAY (MISCELLANEOUS) ×4
MAT ABSORB FLUID 56X50 GRAY (MISCELLANEOUS) ×6 IMPLANT
NDL FASCIA INCISION 18GA (NEEDLE) ×5 IMPLANT
PACK BASIN MINOR ARMC (MISCELLANEOUS) ×5 IMPLANT
PAD ABD DERMACEA PRESS 5X9 (GAUZE/BANDAGES/DRESSINGS) ×10 IMPLANT
PAD PREP 24X41 OB/GYN DISP (PERSONAL CARE ITEMS) ×5 IMPLANT
PROBE CYBERWAND SET (MISCELLANEOUS) ×5 IMPLANT
SENSORWIRE 0.038 NOT ANGLED (WIRE) ×15
SET CYSTO W/LG BORE CLAMP LF (SET/KITS/TRAYS/PACK) ×2 IMPLANT
SET IRRIGATING DISP (SET/KITS/TRAYS/PACK) ×5 IMPLANT
SHEET NEURO XL SOL CTL (MISCELLANEOUS) ×5 IMPLANT
SOL .9 NS 3000ML IRR  AL (IV SOLUTION) ×24
SOL .9 NS 3000ML IRR UROMATIC (IV SOLUTION) ×12 IMPLANT
SPONGE DRAIN TRACH 4X4 STRL 2S (GAUZE/BANDAGES/DRESSINGS) ×5 IMPLANT
STENT URET 6FRX24 CONTOUR (STENTS) ×2 IMPLANT
STENT URET 6FRX26 CONTOUR (STENTS) IMPLANT
STRAP SAFETY 5IN WIDE (MISCELLANEOUS) ×10 IMPLANT
SUT ETHILON 2 0 FS 18 (SUTURE) ×2 IMPLANT
SUT SILK 0 SH 30 (SUTURE) ×5 IMPLANT
SYR 10ML LL (SYRINGE) ×5 IMPLANT
SYR 20CC LL (SYRINGE) ×5 IMPLANT
SYR 30ML LL (SYRINGE) ×5 IMPLANT
SYRINGE IRR TOOMEY STRL 70CC (SYRINGE) ×5 IMPLANT
TAPE CLOTH 10X20 WHT NS LF (TAPE) ×3 IMPLANT
TAPE CLOTH 2X10 WHT NS LF (TAPE) ×2
TAPE MICROFOAM 4IN (TAPE) ×5 IMPLANT
TRAP SPECIMEN MUCOUS 40CC (MISCELLANEOUS) ×2 IMPLANT
TRAY FOLEY MTR SLVR 16FR STAT (SET/KITS/TRAYS/PACK) ×5 IMPLANT
TRAY FOLEY SLVR 16FR LF STAT (SET/KITS/TRAYS/PACK) ×2 IMPLANT
TUBING CONNECTING 10 (TUBING) ×4 IMPLANT
TUBING CONNECTING 10' (TUBING) ×1
VALVE UROSEAL ADJ ENDO (VALVE) ×2 IMPLANT
WATER STERILE IRR 1000ML POUR (IV SOLUTION) ×5 IMPLANT
WIRE AMPLATZ SSTIFF .035X260CM (WIRE) ×2 IMPLANT
WIRE SENSOR 0.038 NOT ANGLED (WIRE) ×3 IMPLANT

## 2017-12-03 NOTE — Op Note (Signed)
Date of procedure: 12/03/17  Preoperative diagnosis:  1. Right complete staghorn stone  Postoperative diagnosis:  1. Same  Procedure: 1. Right percutaneous nephrolithotomy 2. Right retrograde ureteral stent placement, nephrostomy tube placement  Surgeon: Nickolas Madrid, MD; Hollice Espy, MD  Anesthesia: General  Complications: None  Intraoperative findings:  1.  Complete right staghorn stone  2.  Dilated upper pole  EBL: 50 cc  Specimens: Stone for analysis  Drains: Right 25F X 24 cm stent, 20 French council nephrostomy tube  Indication: Candice Robinson is a 47 y.o. patient with complete right staghorn stone.  After reviewing the management options for treatment, they elected to proceed with the above surgical procedure(s). We have discussed the potential benefits and risks of the procedure, side effects of the proposed treatment, the likelihood of the patient achieving the goals of the procedure, and any potential problems that might occur during the procedure or recuperation. Informed consent has been obtained.  We specifically discussed the need for likely staged procedure with follow-up repeat percutaneous nephrolithotomy versus ureteroscopy.  Description of procedure:  The patient was taken to the operating room and general anesthesia was induced.  The patient was placed in the prone position, prepped and draped in the usual sterile fashion, and preoperative antibiotics(Cipro and Ancef) were administered. A preoperative time-out was performed.   A nephroureteral catheter was placed earlier in the day by interventional radiology, and this is dictated separately.  A Super Stiff wire was used to cannulate the nephroureteral catheter and advanced under fluoroscopy down into the bladder.  An 77/10 [place] dilator was then used to add a second sensor safety wire.  A small 1 cm incision was made in the skin alongside the wire.  The fascial blade was then used over the wire to incise  the fascia in cruciate fashion.  A a 63 French dilating balloon was was then advanced over the wire, and under fluoroscopic vision used to dilate the tract.  The sheath was then carefully advanced under fluoroscopic vision over the balloon.  The balloon was deflated and removed and we entered the kidney with the rigid nephroscope.  Visualization was very good.  We immediately identified yellowish-white stone, and this was fragmented and suctioned out using the cyber wand.  The entire renal pelvis and all calyces were filled with stone.  We worked our way through the calyx we entered into the renal pelvis and cleared all visible stone down to the UPJ.  On fluoroscopy we could see multiple calyces at nearly 180 degree angles that had residual stone.  At this point flexible nephroscopy was performed, however we were still unable to successfully access these calyces secondary to the acute angles.  My partner Dr.Brandon was assisting during the case, and we elected to try ureteroscopy from below.  A flexible cystoscope was placed into the urethra in standard sterile fashion and the sensor safety wire was grasped and pulled out the meatus.  A dual-lumen dilating catheter was then advanced under fluoroscopic vision over the wire up the right ureter, and a second safety sensor wire was added.  We then attempted to pass the flexible ureteroscope from below over the wire to better access these calyces.  However despite numerous attempts we met resistance in the mid ureter, likely secondary to edema.  At this point we abandoned retrograde ureteroscopy, and attempted antegrade ureteroscopy.  We were able to identify some stone in the upper pole that was previously dilated on her CT scan and this was  fragmented to dust using the laser fiber on settings of 0.2 J and 40 Hz.  After approximately 45 minutes of fragmentation vision was quite poor secondary to bleeding and debris and we elected to stop this procedure, with plan for  a follow-up retrograde ureteroscopy for her residual stone.  Contrast was injected into the collecting system via the ureteroscope and no extravasation was noted.  A 6 French by 24 cm stent was placed over our sensor wire via the urethra and under direct vision with the nephroscope we were able to see an excellent curl in the renal pelvis.  Fluoroscopy confirmed curl in the bladder.  We then advanced a 13 Teacher, early years/pre over a Super Stiff wire from above and 4 cc were placed in the balloon.  Injection of contrast revealed excellent placement within the collecting system and good antegrade drainage, and again no extravasation was seen.  At this point all her other wires and the sheath were sequentially removed.  There was no significant bleeding from the tract.  The 20 [place] council nephrostomy tube was sutured into place using a 2-0 nylon stitch.  Both tubes drained pink urine.  Disposition: Stable to PACU  Plan: Remove urethral Foley in the morning and clamp nephrostomy tube.  If patient not having flank pain we will plan to remove council nephrostomy tube prior to discharge tomorrow.  She will follow-up in approximately 2 to 3 weeks for repeat ureteroscopy, allowing for passive dilation with the stent.  Nickolas Madrid, MD

## 2017-12-03 NOTE — Procedures (Signed)
  Procedure: R perc nephroureteral antegrade 47f EBL:   minimal Complications:  none immediate  See full dictation in [employer].  Thora Lance MD Main # [phone] Pager  [phone]

## 2017-12-03 NOTE — H&P (Signed)
    7:39 AM   Candice Robinson 07-16-1970 [id number]  CC: Right staghorn stone   HPI: Ms. Candice Robinson is a healthy 47 year old female who was found to have a complete right staghorn stone. She is having significant flank pain after nephrostomy tube placement in IR, but denies fevers/chills.  She has elected to proceed with right percutaneous nephrolithotomy.   PMH: Past Medical History:  Diagnosis Date  . Kidney stone     Surgical History: Past Surgical History:  Procedure Laterality Date  . TUBAL LIGATION  1996  . tummy tuck  2004      Allergies:  Allergies  Allergen Reactions  . Codeine Other (See Comments)    Hallucinations  . Gluten Meal     Stomach bloating, indigestion     Family History: Family History  Problem Relation Age of Onset  . Diabetes Mother   . Hyperlipidemia Mother   . Hypertension Mother   . Hyperlipidemia Father   . Hypertension Father   . Heart disease Father   . Lupus Sister   . Diabetes Sister   . Hypertension Sister   . Breast cancer Maternal Aunt   . Breast cancer Maternal Aunt   . Bladder Cancer Neg Hx   . Kidney cancer Neg Hx     Social History:  reports that she has never smoked. She has never used smokeless tobacco. She reports that she drinks alcohol. She reports that she does not use drugs.  ROS: Please see flowsheet from today's date for complete review of systems.  Physical Exam: BP 127/81   Pulse 86   Temp (!) 97.4 F (36.3 C) (Tympanic)   Resp 12   Ht 5\' 3"  (1.6 m)   Wt 74.8 kg   LMP 10/24/2017 (Exact Date)   SpO2 100%   BMI 29.23 kg/m    Constitutional:  Appears uncomfortable. Cardiovascular: RRR Respiratory: CTA bilaterally GI: Abdomen is soft, nontender, nondistended, no abdominal masses GU: Right CVA tenderness Lymph: No cervical or inguinal lymphadenopathy. Skin: No rashes, bruises or suspicious lesions. Neurologic: Grossly intact, no focal deficits, moving all 4 extremities. Psychiatric: Normal  mood and affect.  Laboratory Data: Urine culture 11/25/2017: No growth  Pertinent Imaging: I have personally reviewed the CT renal stone protocol dated 11/02/2017: Complete right staghorn stone  Assessment & Plan:   In summary, Ms. Candice Robinson is a healthy 47 year old female found to have a complete right staghorn stone.  She has elected to proceed with percutaneous nephrolithotomy.  Informed consent was obtained.  We specifically discussed the risks of bleeding, infection, pulmonary complications, damage to surrounding structures, and likely need for staged procedure and possible follow-up repeat PCNL versus repeat ureteroscopy.  Sondra Come, MD  [facility] [address] [place], [place] [address] [phone]

## 2017-12-03 NOTE — Anesthesia Preprocedure Evaluation (Signed)
Anesthesia Evaluation  Patient identified by MRN, date of birth, ID band Patient awake    Reviewed: Allergy & Precautions, NPO status , Patient's Chart, lab work & pertinent test results  History of Anesthesia Complications Negative for: history of anesthetic complications  Airway Mallampati: II  TM Distance: >3 FB Neck ROM: Full    Dental no notable dental hx.    Pulmonary neg pulmonary ROS, neg sleep apnea, neg COPD,    breath sounds clear to auscultation- rhonchi (-) wheezing      Cardiovascular Exercise Tolerance: Good (-) hypertension(-) CAD and (-) Past MI  Rhythm:Regular Rate:Normal - Systolic murmurs and - Diastolic murmurs    Neuro/Psych negative neurological ROS  negative psych ROS   GI/Hepatic negative GI ROS, Neg liver ROS,   Endo/Other  negative endocrine ROSneg diabetes  Renal/GU Renal disease (nephrolithiasis)     Musculoskeletal negative musculoskeletal ROS (+)   Abdominal (+) + obese,   Peds  Hematology negative hematology ROS (+)   Anesthesia Other Findings Past Medical History: No date: Kidney stone   Reproductive/Obstetrics                             Anesthesia Physical Anesthesia Plan  ASA: II  Anesthesia Plan: General   Post-op Pain Management:    Induction: Intravenous  PONV Risk Score and Plan: 2 and Ondansetron, Dexamethasone and Midazolam  Airway Management Planned: Oral ETT  Additional Equipment:   Intra-op Plan:   Post-operative Plan: Extubation in OR  Informed Consent: I have reviewed the patients History and Physical, chart, labs and discussed the procedure including the risks, benefits and alternatives for the proposed anesthesia with the patient or authorized representative who has indicated his/her understanding and acceptance.   Dental advisory given  Plan Discussed with: CRNA and Anesthesiologist  Anesthesia Plan Comments:          Anesthesia Quick Evaluation

## 2017-12-03 NOTE — Progress Notes (Signed)
Transported from specials to pre-op bay; c/o severe pain and nausea. Dr. Priscella Mann informed and came to see patient. Assisted to the bathroom, urinated hematuria noted. Dr. Richardo Hanks came to bedside and talked with patient and family. Will be going to the OR shortly.

## 2017-12-03 NOTE — Anesthesia Postprocedure Evaluation (Signed)
Anesthesia Post Note  Patient: [name]  Procedure(s) Performed: NEPHROLITHOTOMY PERCUTANEOUS (Right Flank) HOLMIUM LASER APPLICATION (Right ) CYSTOSCOPY WITH STENT PLACEMENT (Right Ureter)  Patient location during evaluation: PACU Anesthesia Type: General Level of consciousness: awake and alert and oriented Pain management: pain level controlled Vital Signs Assessment: post-procedure vital signs reviewed and stable Respiratory status: spontaneous breathing, nonlabored ventilation and respiratory function stable Cardiovascular status: blood pressure returned to baseline and stable Postop Assessment: no signs of nausea or vomiting Anesthetic complications: no     Last Vitals:  Vitals:   12/03/17 1422 12/03/17 1437  BP: 114/82 124/77  Pulse: 68 69  Resp: 11 13  Temp:    SpO2: 99% 98%    Last Pain:  Vitals:   12/03/17 1437  TempSrc:   PainSc: Asleep                 Iretta Mangrum

## 2017-12-03 NOTE — Anesthesia Post-op Follow-up Note (Signed)
Anesthesia QCDR form completed.        

## 2017-12-03 NOTE — Anesthesia Procedure Notes (Signed)
Procedure Name: Intubation Date/Time: 12/03/2017 9:51 AM Performed by: Gentry Fitz, CRNA Pre-anesthesia Checklist: Patient identified, Emergency Drugs available and Suction available Patient Re-evaluated:Patient Re-evaluated prior to induction Oxygen Delivery Method: Circle system utilized Preoxygenation: Pre-oxygenation with 100% oxygen Induction Type: IV induction Ventilation: Mask ventilation without difficulty Laryngoscope Size: Mac and 4 Grade View: Grade II Tube type: Oral Tube size: 7.0 mm Number of attempts: 1 Placement Confirmation: ETT inserted through vocal cords under direct vision,  positive ETCO2 and breath sounds checked- equal and bilateral Secured at: 22 cm Tube secured with: Tape Dental Injury: Teeth and Oropharynx as per pre-operative assessment

## 2017-12-03 NOTE — Progress Notes (Signed)
Pt. C/o 10:10 pain-right flank.  Diaphoretic-temp. 97.5.  VS stable.  SDS notified-to come get pt. For pain control by anesthesia.

## 2017-12-03 NOTE — Progress Notes (Signed)
Interventional radiology nurse spoke with patient and family and then transported patient to the specials recovery unit.

## 2017-12-03 NOTE — Transfer of Care (Signed)
Immediate Anesthesia Transfer of Care Note  Patient: Candice Robinson  Procedure(s) Performed: NEPHROLITHOTOMY PERCUTANEOUS (Right Flank) HOLMIUM LASER APPLICATION (Right ) CYSTOSCOPY WITH STENT PLACEMENT (Right Ureter)  Patient Location: PACU  Anesthesia Type:General  Level of Consciousness: drowsy and patient cooperative  Airway & Oxygen Therapy: Patient Spontanous Breathing and Patient connected to face mask oxygen  Post-op Assessment: Report given to RN and Post -op Vital signs reviewed and stable  Post vital signs: Reviewed and stable  Last Vitals:  Vitals Value Taken Time  BP 117/70 12/03/2017  1:07 PM  Temp    Pulse 102 12/03/2017  1:10 PM  Resp    SpO2 99 % 12/03/2017  1:10 PM  Vitals shown include unvalidated device data.  Last Pain:  Vitals:   12/03/17 0936  TempSrc:   PainSc: 10-Worst pain ever         Complications: No apparent anesthesia complications

## 2017-12-04 ENCOUNTER — Encounter: Payer: Self-pay | Admitting: Urology

## 2017-12-04 ENCOUNTER — Observation Stay: Payer: Commercial Managed Care - PPO

## 2017-12-04 DIAGNOSIS — N200 Calculus of kidney: Secondary | ICD-10-CM | POA: Diagnosis not present

## 2017-12-04 LAB — BASIC METABOLIC PANEL
Anion gap: 5 (ref 5–15)
BUN: 11 mg/dL (ref 6–20)
CO2: 23 mmol/L (ref 22–32)
CREATININE: 0.81 mg/dL (ref 0.44–1.00)
Calcium: 8.2 mg/dL — ABNORMAL LOW (ref 8.9–10.3)
Chloride: 110 mmol/L (ref 98–111)
GFR calc Af Amer: >60 mL/min (ref >60)
GLUCOSE: 146 mg/dL — AB (ref 70–99)
POTASSIUM: 4.3 mmol/L (ref 3.5–5.1)
Sodium: 138 mmol/L (ref 135–145)

## 2017-12-04 LAB — CBC
HEMATOCRIT: 34.5 % — AB (ref 35.0–47.0)
Hemoglobin: 11.1 g/dL — ABNORMAL LOW (ref 12.0–16.0)
MCH: 23.7 pg — ABNORMAL LOW (ref 26.0–34.0)
MCHC: 32 g/dL (ref 32.0–36.0)
MCV: 74.1 fL — ABNORMAL LOW (ref 80.0–100.0)
PLATELETS: 268 10*3/uL (ref 150–440)
RBC: 4.66 MIL/uL (ref 3.80–5.20)
RDW: 16.9 % — AB (ref 11.5–14.5)
WBC: 18 10*3/uL — ABNORMAL HIGH (ref 3.6–11.0)

## 2017-12-04 MED ORDER — OXYBUTYNIN CHLORIDE 5 MG PO TABS
5.0000 mg | ORAL_TABLET | Freq: Three times a day (TID) | ORAL | Status: DC | PRN
  Administered 2017-12-04 – 2017-12-05 (×2): 5 mg via ORAL
  Filled 2017-12-04 (×2): qty 1

## 2017-12-04 MED ORDER — TAMSULOSIN HCL 0.4 MG PO CAPS
0.4000 mg | ORAL_CAPSULE | Freq: Every day | ORAL | Status: DC
  Administered 2017-12-04 (×2): 0.4 mg via ORAL
  Filled 2017-12-04 (×2): qty 1

## 2017-12-04 NOTE — Progress Notes (Signed)
She has stent pain with voiding. Flank dry. Discussed with pt and Juan. Will give oxybutynin and tamsulosin. May need foley back (to prevent reflux) and home with foley or try again tomorrow. Will check stent position on KUB.

## 2017-12-04 NOTE — Progress Notes (Signed)
Per MD okay for order a KUB. Also is pain pain continues put foley back and discontinue discharge order.

## 2017-12-04 NOTE — Progress Notes (Signed)
Pt still complaining of pain RN will place foley catheter and DC discharge order.

## 2017-12-04 NOTE — Progress Notes (Signed)
  December 04, 2017  Patient: Candice Robinson  Date of Birth: [birth year]  Date of Visit: 11/12/2017    To Whom It May Concern:  [name] was seen and treated at [facility] on 12/03/2017 - 12/04/2017. [name]  may return to work on 12/07/2017.  Sincerely,     Doyne Keel, MD

## 2017-12-04 NOTE — Discharge Summary (Addendum)
Physician Discharge Summary  Patient ID: Candice Robinson MRN: [id number] DOB/AGE: 11/23/1970 47 y.o.  Admit date: 12/03/2017 Discharge date: 12/04/2017  Admission Diagnoses: Right renal stone  Discharge Diagnoses:  Active Problems:   Stone, kidney   Discharged Condition: good  Hospital Course: Patient was admitted following right PCNL.  She did well.  Her right nephrostomy tube was Several hours ago and her Foley catheter removed on postoperative day #1.  She has voided several times and had no fevers or flank pain.  She has some pain when she takes a deep breath on the right side but has no chest pain or shortness of breath.  Postoperative chest x-ray was normal on the right.    On POD#1, The right nephrostomy tube site was inspected and noted to be dry.  The nephrostomy was uncapped and there was minimal residual in the urine then dripped onto a 4 x 4 was clear. The suture was cut and the balloon deflated.  The nephrostomy was removed without difficulty and a new dressing placed.  She was ambulating without difficulty and tolerating a regular diet.  Vital signs and oxygen saturations are normal.  She was ready for discharge, but had severe rt flank pain with voiding. Foley was replaced which relieved the pain. KUB showed stent in good position and residual stone. She was observed for another night. Pt with minimal pain overnight. Foley removed again this AM and she has done well. Voided 3 x with some flank pain but manageable. No CP or SOB. She is tolerating a regular diet and pain controlled. She wanted to be written out of work for a few days. I gave her an excuse for tomorrow and told her to contact office if she couldn't go back Tuesday. We discussed normal activity can be carried out with a stent in place. Tamsulosin helped and she requested a Rx, but she has three refills on her rx. She was informed.   Consults: None  Significant Diagnostic Studies: None  Treatments: surgery: Right  percutaneous nephrolithotomy and right ureteral stent placement  Discharge Exam: Blood pressure 111/68, pulse 80, temperature 98.6 F (37 C), temperature source Oral, resp. rate 16, height 5\' 3"  (1.6 m), weight 74.8 kg, last menstrual period 10/24/2017, SpO2 96 %. She is resting comfortably and watching TV, talking with her family Cardiovascular-regular rate and rhythm Respiratory-regular effort and depth Abdomen-soft and nontender Extremity-no lower extremity pain or swelling   Disposition: Discharge disposition: 01-Home or Self Care       Discharge Instructions    Discharge instructions   Complete by:  As directed    It is normal to have blood in the urine with your stent in place. The stent can cause burning with urination, urgency, and frequency, and flank pain.  Call the clinic or go to the ER if you develop fever over 101, severe flank pain, nausea/vomiting, or large blood clots in the urine.  We will call you to schedule repeat surgery(ureteroscopy) to treat the rest of your stone.  No strenuous activity for one week, walking and stairs are OK.  Take pain medications as needed. The flomax will help with pain from the stent.     Allergies as of 12/04/2017      Reactions   Codeine Other (See Comments)   Hallucinations   Gluten Meal    Stomach bloating, indigestion       Medication List    TAKE these medications   diphenhydrAMINE 25 MG tablet Commonly known as:  BENADRYL Take 25 mg by mouth at bedtime as needed for allergies.   HYDROcodone-acetaminophen 5-325 MG tablet Commonly known as:  NORCO/VICODIN Take 1-2 tablets by mouth every 4 (four) hours as needed for up to 7 days for moderate pain.   ibuprofen 200 MG tablet Commonly known as:  ADVIL,MOTRIN Take 400-800 mg by mouth daily as needed for headache or moderate pain.   PROBIOTIC DAILY PO Take 1 capsule by mouth daily.   tamsulosin 0.4 MG Caps capsule Commonly known as:  FLOMAX Take 1 capsule (0.4  mg total) by mouth daily.      Follow-up Information    Call Candice Come, MD.   Specialty:  Urology Contact information: [address] [place] [address] [phone]           Signed: Jerilee Field 12/04/2017, 10:47 AM

## 2017-12-04 NOTE — Discharge Instructions (Signed)
Ureteral Stent Implantation, Care After Refer to this sheet in the next few weeks. These instructions provide you with information about caring for yourself after your procedure. Your health care provider may also give you more specific instructions. Your treatment has been planned according to current medical practices, but problems sometimes occur. Call your health care provider if you have any problems or questions after your procedure.  Be sure to follow-up with Dr. Richardo Robinson to plan stent/stone removal  What can I expect after the procedure? After the procedure, it is common to have:  Nausea.  Mild pain when you urinate. You may feel this pain in your lower back or lower abdomen. Pain should stop within a few minutes after you urinate. This may last for up to 1 week.  A small amount of blood in your urine for several days.  Follow these instructions at home:  Medicines  Take over-the-counter and prescription medicines only as told by your health care provider.  If you were prescribed an antibiotic medicine, take it as told by your health care provider. Do not stop taking the antibiotic even if you start to feel better.  Do not drive for 24 hours if you received a sedative.  Do not drive or operate heavy machinery while taking prescription pain medicines. Activity  Return to your normal activities as told by your health care provider. Ask your health care provider what activities are safe for you.  Do not lift anything that is heavier than 10 lb (4.5 kg). Follow this limit for 1 week after your procedure, or for as long as told by your health care provider. General instructions  Watch for any blood in your urine. Call your health care provider if the amount of blood in your urine increases.  If you have a catheter: ? Follow instructions from your health care provider about taking care of your catheter and collection bag. ? Do not take baths, swim, or use a hot tub until your  health care provider approves.  Drink enough fluid to keep your urine clear or pale yellow.  Keep all follow-up visits as told by your health care provider. This is important. Contact a health care provider if:  You have pain that gets worse or does not get better with medicine, especially pain when you urinate.  You have difficulty urinating.  You feel nauseous or you vomit repeatedly during a period of more than 2 days after the procedure. Get help right away if:  Your urine is dark red or has blood clots in it.  You are leaking urine (have incontinence).  The end of the stent comes out of your urethra.  You cannot urinate.  You have sudden, sharp, or severe pain in your abdomen or lower back.  You have a fever. This information is not intended to replace advice given to you by your health care provider. Make sure you discuss any questions you have with your health care provider. Document Released: 10/19/2012 Document Revised: 07/25/2015 Document Reviewed: 08/31/2014 Elsevier Interactive Patient Education  2018 [employer]  Percutaneous Nephrolithotomy, Care After Refer to this sheet in the next few weeks. These instructions provide you with information about caring for yourself after your procedure. Your health care provider may also give you more specific instructions. Your treatment has been planned according to current medical practices, but problems sometimes occur. Call your health care provider if you have any problems or questions after your procedure. What can I expect after the procedure? After  the procedure, it is common to have:  Soreness or pain.  Fatigue.  Some blood in your urine for a few days.  Follow these instructions at home: Incision care   Follow instructions from your health care provider about how to take care of your cut from surgery (incision). Make sure you: ? Wash your hands with soap and water before you change your bandage (dressing). If  soap and water are not available, use hand sanitizer. ? Change your dressing as told by your health care provider. ? Leave stitches (sutures), skin glue, or adhesive strips in place. These skin closures may need to stay in place for 2 weeks or longer. If adhesive strip edges start to loosen and curl up, you may trim the loose edges. Do not remove adhesive strips completely unless your health care provider tells you to do that.  Check your incision area every day for signs of infection. Check for: ? More redness, swelling, or pain. ? More fluid or blood. ? Warmth. ? Pus or a bad smell. Activity  Avoid strenuous activities for as long as told by your health care provider.  Return to your normal activities as told by your health care provider. Ask your health care provider what activities are safe for you. General instructions  Take over-the-counter and prescription medicines only as told by your health care provider.  Do not drive or operate heavy machinery while taking prescription pain medicine.  Keep all follow-up visits as told by your health care provider. This is important.  You may have been sent home with a catheter or kidney drain tube. If so, carefully follow your health care providers instructions on how to take care of your catheter or kidney drain tube. Contact a health care provider if:  You have a fever.  You have more redness, swelling, or pain around your incision.  You have more fluid or blood coming from your incision.  Your incision feels warm to the touch.  You have pus or a bad smell coming from your incision.  You lose your appetite.  You feel nauseous or you vomit. Get help right away if:  You have blood clots in your urine.  You cannot urinate.  You have chest pain or trouble breathing. This information is not intended to replace advice given to you by your health care provider. Make sure you discuss any questions you have with your health care  provider. Document Released: 03/15/2015 Document Revised: 07/25/2015 Document Reviewed: 08/06/2014 Elsevier Interactive Patient Education  2018 [employer].

## 2017-12-05 DIAGNOSIS — N200 Calculus of kidney: Secondary | ICD-10-CM | POA: Diagnosis not present

## 2017-12-05 NOTE — Progress Notes (Signed)
Per MD okay for RN to DC foley catheter.   

## 2017-12-05 NOTE — Progress Notes (Signed)
Candice Robinson  A and O x 4. VSS. Pt tolerating diet well. No complaints of pain or nausea. IV removed intact, prescriptions given. Pt voiced understanding of discharge instructions with no further questions. Pt discharged via wheelchair with nurse tech.   Allergies as of 12/05/2017      Reactions   Codeine Other (See Comments)   Hallucinations   Gluten Meal    Stomach bloating, indigestion       Medication List    TAKE these medications   diphenhydrAMINE 25 MG tablet Commonly known as:  BENADRYL Take 25 mg by mouth at bedtime as needed for allergies.   HYDROcodone-acetaminophen 5-325 MG tablet Commonly known as:  NORCO/VICODIN Take 1-2 tablets by mouth every 4 (four) hours as needed for up to 7 days for moderate pain.   ibuprofen 200 MG tablet Commonly known as:  ADVIL,MOTRIN Take 400-800 mg by mouth daily as needed for headache or moderate pain.   PROBIOTIC DAILY PO Take 1 capsule by mouth daily.   tamsulosin 0.4 MG Caps capsule Commonly known as:  FLOMAX Take 1 capsule (0.4 mg total) by mouth daily.       Vitals:   12/05/17 0634 12/05/17 1238  BP: 122/80 133/78  Pulse: 88 73  Resp: 16   Temp: 98.6 F (37 C) 98.1 F (36.7 C)  SpO2: 98% 98%    Candice Robinson

## 2017-12-06 ENCOUNTER — Telehealth: Payer: Self-pay | Admitting: Radiology

## 2017-12-06 ENCOUNTER — Other Ambulatory Visit: Payer: Self-pay | Admitting: Radiology

## 2017-12-06 DIAGNOSIS — N200 Calculus of kidney: Secondary | ICD-10-CM

## 2017-12-06 MED ORDER — OXYBUTYNIN CHLORIDE 5 MG PO TABS: 5.0000 mg | ORAL_TABLET | Freq: Three times a day (TID) | ORAL | 0 refills | Status: DC | PRN

## 2017-12-06 MED ORDER — TAMSULOSIN HCL 0.4 MG PO CAPS: 0.4000 mg | ORAL_CAPSULE | Freq: Every day | ORAL | 0 refills | Status: DC

## 2017-12-06 NOTE — Telephone Encounter (Signed)
Patient reports right back pain at incision site from nephrostomy tube placement for surgery on 12/03/2017 & pain in lower abdomen. Explained to patient that her back soreness is likely related to the nephrostomy tube placement but it should decrease over the next few days. Also explained the lower abdominal pain is likely stent pain. Patient states she threw previously prescribed tamsulosin away because she was told to stop taking it. Per Dr Estil Daft discharge note on 12/04/2017, patient should continue tamsulosin & oxybutynin however prescriptions were not sent home with patient upon discharge. Notified patient that scripts would be sent to pharmacy & that they should help with stent pain. Advised patient to use ibuprofen in addition to hydrocodone as needed for pain. Advised patient to call back if symptoms do not improve. Questions answered. Patient voices understanding.  Dr Richardo Hanks - patient requests return to work note. She states pain is severe at this time. Should she return to work prior to follow up ureteroscopy planned in 2-3 weeks?

## 2017-12-06 NOTE — Telephone Encounter (Signed)
She can take this week off of work and see how shes feeling after next weekend.   I will put the URS scheduling sheet on your desk this evening.  Thanks Legrand Rams, MD 12/06/2017

## 2017-12-07 LAB — STONE ANALYSIS
Ca Oxalate,Monohydr.: 60 %
Ca phos cry stone ql IR: 40 %
Stone Weight KSTONE: 26 mg

## 2017-12-09 ENCOUNTER — Other Ambulatory Visit: Payer: Self-pay | Admitting: Radiology

## 2017-12-09 ENCOUNTER — Telehealth: Payer: Self-pay

## 2017-12-09 DIAGNOSIS — N200 Calculus of kidney: Secondary | ICD-10-CM

## 2017-12-09 NOTE — Telephone Encounter (Signed)
Pt contacted our office post surgery with Dr. Richardo Hanks, she is experiencing some constipation. She states she has not gone since Friday. She was instructed to try Miralax and a suppository. Pt states she would try this and call us if she needs to.

## 2017-12-10 ENCOUNTER — Ambulatory Visit: Payer: Commercial Managed Care - PPO | Admitting: Family Medicine

## 2017-12-10 ENCOUNTER — Emergency Department: Payer: Commercial Managed Care - PPO

## 2017-12-10 ENCOUNTER — Inpatient Hospital Stay: Payer: Commercial Managed Care - PPO

## 2017-12-10 ENCOUNTER — Encounter: Payer: Self-pay | Admitting: Family Medicine

## 2017-12-10 ENCOUNTER — Encounter: Payer: Self-pay | Admitting: Medical Oncology

## 2017-12-10 ENCOUNTER — Inpatient Hospital Stay
Admission: EM | Admit: 2017-12-10 | Discharge: 2017-12-12 | DRG: 872 | Disposition: A | Discharge status: Home / Self Care | Payer: Commercial Managed Care - PPO | Attending: Family Medicine | Admitting: Family Medicine

## 2017-12-10 ENCOUNTER — Other Ambulatory Visit: Payer: Self-pay | Admitting: Radiology

## 2017-12-10 ENCOUNTER — Other Ambulatory Visit: Payer: Self-pay

## 2017-12-10 VITALS — BP 125/80 | HR 120 | Temp 100.3°F | Ht 63.0 in | Wt 165.8 lb

## 2017-12-10 DIAGNOSIS — R1030 Lower abdominal pain, unspecified: Secondary | ICD-10-CM | POA: Diagnosis not present

## 2017-12-10 DIAGNOSIS — N390 Urinary tract infection, site not specified: Secondary | ICD-10-CM | POA: Diagnosis not present

## 2017-12-10 DIAGNOSIS — Z833 Family history of diabetes mellitus: Secondary | ICD-10-CM | POA: Diagnosis not present

## 2017-12-10 DIAGNOSIS — Z832 Family history of diseases of the blood and blood-forming organs and certain disorders involving the immune mechanism: Secondary | ICD-10-CM

## 2017-12-10 DIAGNOSIS — Z803 Family history of malignant neoplasm of breast: Secondary | ICD-10-CM | POA: Diagnosis not present

## 2017-12-10 DIAGNOSIS — K9041 Non-celiac gluten sensitivity: Secondary | ICD-10-CM | POA: Diagnosis present

## 2017-12-10 DIAGNOSIS — Z8349 Family history of other endocrine, nutritional and metabolic diseases: Secondary | ICD-10-CM | POA: Diagnosis not present

## 2017-12-10 DIAGNOSIS — Z8249 Family history of ischemic heart disease and other diseases of the circulatory system: Secondary | ICD-10-CM | POA: Diagnosis not present

## 2017-12-10 DIAGNOSIS — N136 Pyonephrosis: Secondary | ICD-10-CM | POA: Diagnosis present

## 2017-12-10 DIAGNOSIS — R000 Tachycardia, unspecified: Secondary | ICD-10-CM | POA: Diagnosis present

## 2017-12-10 DIAGNOSIS — K5900 Constipation, unspecified: Secondary | ICD-10-CM | POA: Diagnosis present

## 2017-12-10 DIAGNOSIS — A419 Sepsis, unspecified organism: Secondary | ICD-10-CM | POA: Diagnosis not present

## 2017-12-10 DIAGNOSIS — R509 Fever, unspecified: Secondary | ICD-10-CM | POA: Diagnosis not present

## 2017-12-10 DIAGNOSIS — N200 Calculus of kidney: Secondary | ICD-10-CM | POA: Diagnosis present

## 2017-12-10 DIAGNOSIS — D72829 Elevated white blood cell count, unspecified: Secondary | ICD-10-CM | POA: Diagnosis not present

## 2017-12-10 DIAGNOSIS — Z885 Allergy status to narcotic agent status: Secondary | ICD-10-CM

## 2017-12-10 DIAGNOSIS — N135 Crossing vessel and stricture of ureter without hydronephrosis: Secondary | ICD-10-CM

## 2017-12-10 LAB — CBC
HCT: 33.5 % — ABNORMAL LOW (ref 36.0–46.0)
Hemoglobin: 10.4 g/dL — ABNORMAL LOW (ref 12.0–15.0)
MCH: 23.5 pg — ABNORMAL LOW (ref 26.0–34.0)
MCHC: 31 g/dL (ref 30.0–36.0)
MCV: 75.6 fL — ABNORMAL LOW (ref 80.0–100.0)
Platelets: 411 10*3/uL — ABNORMAL HIGH (ref 150–400)
RBC: 4.43 MIL/uL (ref 3.87–5.11)
RDW: 16.3 % — ABNORMAL HIGH (ref 11.5–15.5)
WBC: 19.2 10*3/uL — ABNORMAL HIGH (ref 4.0–10.5)
nRBC: 0 % (ref 0.0–0.2)

## 2017-12-10 LAB — COMPREHENSIVE METABOLIC PANEL
ALT: 38 U/L (ref 0–44)
ANION GAP: 9 (ref 5–15)
AST: 33 U/L (ref 15–41)
Albumin: 2.8 g/dL — ABNORMAL LOW (ref 3.5–5.0)
Alkaline Phosphatase: 105 U/L (ref 38–126)
BUN: 10 mg/dL (ref 6–20)
CHLORIDE: 101 mmol/L (ref 98–111)
CO2: 25 mmol/L (ref 22–32)
Calcium: 8.8 mg/dL — ABNORMAL LOW (ref 8.9–10.3)
Creatinine, Ser: 1.12 mg/dL — ABNORMAL HIGH (ref 0.44–1.00)
GFR calc non Af Amer: 58 mL/min — ABNORMAL LOW (ref >60)
Glucose, Bld: 132 mg/dL — ABNORMAL HIGH (ref 70–99)
Potassium: 3.9 mmol/L (ref 3.5–5.1)
SODIUM: 135 mmol/L (ref 135–145)
Total Bilirubin: 1.1 mg/dL (ref 0.3–1.2)
Total Protein: 7.2 g/dL (ref 6.5–8.1)

## 2017-12-10 LAB — MICROSCOPIC EXAMINATION: WBC, UA: >30 /hpf — AB (ref 0–5)

## 2017-12-10 LAB — UA/M W/RFLX CULTURE, ROUTINE
Bilirubin, UA: NEGATIVE
GLUCOSE, UA: NEGATIVE
NITRITE UA: NEGATIVE
Specific Gravity, UA: 1.02 (ref 1.005–1.030)
Urobilinogen, Ur: 0.2 mg/dL (ref 0.2–1.0)
pH, UA: 6 (ref 5.0–7.5)

## 2017-12-10 LAB — CBC WITH DIFFERENTIAL/PLATELET
HEMOGLOBIN: 10.7 g/dL — AB (ref 11.1–15.9)
Hematocrit: 33 % — ABNORMAL LOW (ref 34.0–46.6)
LYMPHS ABS: 1.7 10*3/uL (ref 0.7–3.1)
LYMPHS: 10 %
MCH: 24.5 pg — ABNORMAL LOW (ref 26.6–33.0)
MCHC: 32.4 g/dL (ref 31.5–35.7)
MCV: 76 fL — ABNORMAL LOW (ref 79–97)
MID (Absolute): 0.9 10*3/uL (ref 0.1–1.6)
MID: 5 %
NEUTROS PCT: 85 %
Neutrophils Absolute: 14.4 10*3/uL — ABNORMAL HIGH (ref 1.4–7.0)
Platelets: 436 10*3/uL (ref 150–450)
RBC: 4.36 x10E6/uL (ref 3.77–5.28)
RDW: 17.1 % — AB (ref 12.3–15.4)
WBC: 17 10*3/uL — ABNORMAL HIGH (ref 3.4–10.8)

## 2017-12-10 LAB — LACTIC ACID, PLASMA
Lactic Acid, Venous: 1 mmol/L (ref 0.5–1.9)
Lactic Acid, Venous: 1 mmol/L (ref 0.5–1.9)

## 2017-12-10 LAB — URINALYSIS, COMPLETE (UACMP) WITH MICROSCOPIC
BILIRUBIN URINE: NEGATIVE
GLUCOSE, UA: NEGATIVE mg/dL
Ketones, ur: NEGATIVE mg/dL
Nitrite: NEGATIVE
Protein, ur: NEGATIVE mg/dL
Specific Gravity, Urine: 1.002 — ABNORMAL LOW (ref 1.005–1.030)
WBC, UA: >50 WBC/hpf — ABNORMAL HIGH (ref 0–5)
pH: 7 (ref 5.0–8.0)

## 2017-12-10 LAB — PROCALCITONIN: Procalcitonin: 0.34 ng/mL

## 2017-12-10 LAB — PROTIME-INR
INR: 1.36
Prothrombin Time: 16.7 seconds — ABNORMAL HIGH (ref 11.4–15.2)

## 2017-12-10 LAB — APTT: APTT: 39 s — AB (ref 24–36)

## 2017-12-10 LAB — LIPASE, BLOOD: Lipase: 18 U/L (ref 11–51)

## 2017-12-10 LAB — PREGNANCY, URINE: Preg Test, Ur: NEGATIVE

## 2017-12-10 MED ORDER — KETOROLAC TROMETHAMINE 30 MG/ML IJ SOLN
30.0000 mg | Freq: Four times a day (QID) | INTRAMUSCULAR | Status: DC
  Administered 2017-12-11 – 2017-12-12 (×6): 30 mg via INTRAVENOUS
  Filled 2017-12-10 (×6): qty 1

## 2017-12-10 MED ORDER — ACETAMINOPHEN 500 MG PO TABS
1000.0000 mg | ORAL_TABLET | Freq: Once | ORAL | Status: AC
  Administered 2017-12-10: 1000 mg via ORAL
  Filled 2017-12-10: qty 2

## 2017-12-10 MED ORDER — SODIUM CHLORIDE 0.9 % IV BOLUS
1000.0000 mL | Freq: Once | INTRAVENOUS | Status: AC
  Administered 2017-12-10: 1000 mL via INTRAVENOUS

## 2017-12-10 MED ORDER — DIPHENHYDRAMINE HCL 25 MG PO CAPS: 25.0000 mg | ORAL_CAPSULE | Freq: Every evening | ORAL | Status: DC | PRN

## 2017-12-10 MED ORDER — SODIUM CHLORIDE 0.9 % IV SOLN
2.0000 g | Freq: Two times a day (BID) | INTRAVENOUS | Status: DC
  Administered 2017-12-10: 2 g via INTRAVENOUS
  Filled 2017-12-10 (×3): qty 2

## 2017-12-10 MED ORDER — HYDROMORPHONE HCL 1 MG/ML IJ SOLN
1.0000 mg | INTRAMUSCULAR | Status: AC
  Administered 2017-12-10: 1 mg via INTRAVENOUS
  Filled 2017-12-10: qty 1

## 2017-12-10 MED ORDER — SODIUM CHLORIDE 0.9 % IV SOLN
INTRAVENOUS | Status: AC
  Administered 2017-12-10 – 2017-12-11 (×4): via INTRAVENOUS

## 2017-12-10 MED ORDER — TAMSULOSIN HCL 0.4 MG PO CAPS: 0.4000 mg | ORAL_CAPSULE | Freq: Every day | ORAL | Status: DC

## 2017-12-10 MED ORDER — PROMETHAZINE HCL 25 MG/ML IJ SOLN
12.5000 mg | Freq: Four times a day (QID) | INTRAMUSCULAR | Status: DC | PRN
  Administered 2017-12-11: 12.5 mg via INTRAVENOUS
  Filled 2017-12-10: qty 1

## 2017-12-10 MED ORDER — ALBUTEROL SULFATE (2.5 MG/3ML) 0.083% IN NEBU: 2.5000 mg | INHALATION_SOLUTION | RESPIRATORY_TRACT | Status: DC | PRN

## 2017-12-10 MED ORDER — SODIUM CHLORIDE 0.9 % IV SOLN
1.0000 g | Freq: Once | INTRAVENOUS | Status: AC
  Administered 2017-12-10: 1 g via INTRAVENOUS
  Filled 2017-12-10: qty 10

## 2017-12-10 MED ORDER — ONDANSETRON HCL 4 MG/2ML IJ SOLN: 4.0000 mg | Freq: Four times a day (QID) | INTRAMUSCULAR | Status: DC | PRN

## 2017-12-10 MED ORDER — ACETAMINOPHEN 325 MG PO TABS: 650.0000 mg | ORAL_TABLET | Freq: Four times a day (QID) | ORAL | Status: DC | PRN

## 2017-12-10 MED ORDER — ENOXAPARIN SODIUM 40 MG/0.4ML ~~LOC~~ SOLN: 40.0000 mg | SUBCUTANEOUS | Status: DC

## 2017-12-10 MED ORDER — TRAMADOL HCL 50 MG PO TABS: 50.0000 mg | ORAL_TABLET | Freq: Four times a day (QID) | ORAL | Status: DC | PRN

## 2017-12-10 MED ORDER — KETOROLAC TROMETHAMINE 30 MG/ML IJ SOLN: 30.0000 mg | Freq: Four times a day (QID) | INTRAMUSCULAR | Status: DC | PRN

## 2017-12-10 MED ORDER — OXYBUTYNIN CHLORIDE 5 MG PO TABS: 5.0000 mg | ORAL_TABLET | Freq: Three times a day (TID) | ORAL | Status: DC | PRN

## 2017-12-10 MED ORDER — RISAQUAD PO CAPS
1.0000 | ORAL_CAPSULE | Freq: Every day | ORAL | Status: DC
  Administered 2017-12-11 – 2017-12-12 (×2): 1 via ORAL
  Filled 2017-12-10 (×3): qty 1

## 2017-12-10 MED ORDER — ONDANSETRON HCL 4 MG/2ML IJ SOLN
4.0000 mg | Freq: Once | INTRAMUSCULAR | Status: AC
  Administered 2017-12-10: 4 mg via INTRAVENOUS
  Filled 2017-12-10: qty 2

## 2017-12-10 MED ORDER — FENTANYL CITRATE (PF) 100 MCG/2ML IJ SOLN
50.0000 ug | Freq: Once | INTRAMUSCULAR | Status: AC
  Administered 2017-12-10: 50 ug via INTRAVENOUS
  Filled 2017-12-10: qty 2

## 2017-12-10 MED ORDER — ONDANSETRON HCL 4 MG PO TABS: 4.0000 mg | ORAL_TABLET | Freq: Four times a day (QID) | ORAL | Status: DC | PRN

## 2017-12-10 MED ORDER — BISACODYL 5 MG PO TBEC: 5.0000 mg | DELAYED_RELEASE_TABLET | Freq: Every day | ORAL | Status: DC | PRN

## 2017-12-10 MED ORDER — ACETAMINOPHEN 650 MG RE SUPP: 650.0000 mg | Freq: Four times a day (QID) | RECTAL | Status: DC | PRN

## 2017-12-10 MED ORDER — TAMSULOSIN HCL 0.4 MG PO CAPS
0.4000 mg | ORAL_CAPSULE | Freq: Every day | ORAL | Status: DC
  Administered 2017-12-10 – 2017-12-11 (×2): 0.4 mg via ORAL
  Filled 2017-12-10 (×2): qty 1

## 2017-12-10 MED ORDER — SENNOSIDES-DOCUSATE SODIUM 8.6-50 MG PO TABS: 1.0000 | ORAL_TABLET | Freq: Every evening | ORAL | Status: DC | PRN

## 2017-12-10 MED ORDER — IOPAMIDOL (ISOVUE-300) INJECTION 61%
100.0000 mL | Freq: Once | INTRAVENOUS | Status: AC | PRN
  Administered 2017-12-10: 100 mL via INTRAVENOUS

## 2017-12-10 NOTE — Progress Notes (Signed)
Called by nursing to explain to husband the need for blood culture. He is apparently influencing his wife to not get them drawn. I spoke with him briefly explaining that even though she has gotten a dose of antibiotics, I would still recommend drawing blood cultures, though a dose of antibiotics can possibly affect the result. He was upset and stated "Dr Gloriann Loan, we met you 30 minutes ago and already don't remember who we are". I told him I very well remember who he is.  He is understandably upset about his wife's condition and will hopefully allow for appropriate care of his wife who is obviously affected by his advice and influence currently.

## 2017-12-10 NOTE — Consult Note (Signed)
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  This was performed by Dr, Richardo Hanks.  The complete stone was unable to be fragmented due to the location.  Ureteroscopy was attempted but this also was unable to remove all stone.  Therefore a ureteral stent was placed with plans for a ureteroscopy in the upcoming future.  Over the past 2 days, the patient has been having increasing lower right abdominal pain and low-grade fever.  In the emergency department, her temperature is 100.9.  She has some tachycardia to the 110s.  She is normotensive.  Her pain is a little bit better after receiving pain medication.  White blood cell count was 19.2.  Creatinine is 1.12.  Urinalysis shows large leukocyte, negative nitrite, rare bacteria, greater than 50 WBCs.  Urine culture has been sent from this.  Her main complaint today is the abdominal pain that is worsening.  CT scan was performed which revealed the stent in good position.  However, she had hydroureteronephrosis and significant stone burden remaining in the right kidney.  She does have chronic hydronephrosis there.  The ureteronephrosis does seem to be increased, possibly secondary to inflammation from recent procedure and infection.  The stent itself appears to be in good position from the kidney down to the bladder.  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  Past Medical History:  Diagnosis Date  . Kidney stone    Past Surgical History:  Procedure Laterality Date  . CYSTOSCOPY WITH STENT PLACEMENT Right 12/03/2017   Procedure: CYSTOSCOPY WITH STENT PLACEMENT;  Surgeon: Sondra Come, MD;  Location: [facility] ORS;  Service: Urology;  Laterality: Right;  . HOLMIUM LASER APPLICATION Right 12/03/2017   Procedure: HOLMIUM LASER  APPLICATION;  Surgeon: Sondra Come, MD;  Location: [facility] ORS;  Service: Urology;  Laterality: Right;  . IR NEPHROSTOMY PLACEMENT RIGHT  12/03/2017  . NEPHROLITHOTOMY Right 12/03/2017   Procedure: NEPHROLITHOTOMY PERCUTANEOUS;  Surgeon: Sondra Come, MD;  Location: [facility] ORS;  Service: Urology;  Laterality: Right;  . TUBAL LIGATION  1996  . tummy tuck  2004    Home Medications:   (Not in a hospital admission) Allergies:  Allergies  Allergen Reactions  . Codeine Other (See Comments)    Hallucinations  . Gluten Meal     Stomach bloating, indigestion     Family History  Problem Relation Age of Onset  . Diabetes Mother   . Hyperlipidemia Mother   . Hypertension Mother   . Hyperlipidemia Father   . Hypertension Father   . Heart disease Father   . Lupus Sister   . Diabetes Sister   . Hypertension Sister   . Breast cancer Maternal Aunt   . Breast cancer Maternal Aunt   . Bladder Cancer Neg Hx   . Kidney cancer Neg Hx    Social History:  reports that she has never smoked. She has never used smokeless tobacco. She reports that she drinks alcohol. She reports that she does not use drugs.  ROS: A complete review of systems was performed.  All systems are negative except for pertinent findings as noted. ROS   Physical Exam:  Vital signs in last 24 hours: Temp:  [99.8 F (37.7 C)-100.9 F (38.3 C)] 100.9 F (38.3 C) (10/11 1846) Pulse Rate:  [95-127] 119 (10/11 1846)  Resp:  [15-25] 24 (10/11 1846) BP: (93-139)/(65-85) 139/68 (10/11 1846) SpO2:  [96 %-100 %] 96 % (10/11 1846) Weight:  [75 kg-75.2 kg] 75 kg (10/11 1423) General:  Alert and oriented, No acute distress HEENT: Normocephalic, atraumatic Neck: No JVD or lymphadenopathy Cardiovascular: Sinus tachycardia, normotensive Lungs: Regular rate and effort Abdomen: Soft, mild tenderness in the right groin, nondistended, no abdominal masses Back: No CVA tenderness Extremities: No edema Neurologic: Grossly  intact  Laboratory Data:  Results for orders placed or performed during the hospital encounter of 12/10/17 (from the past 24 hour(s))  Lipase, blood     Status: None   Collection Time: 12/10/17  2:24 PM  Result Value Ref Range   Lipase 18 11 - 51 U/L  Comprehensive metabolic panel     Status: Abnormal   Collection Time: 12/10/17  2:24 PM  Result Value Ref Range   Sodium 135 135 - 145 mmol/L   Potassium 3.9 3.5 - 5.1 mmol/L   Chloride 101 98 - 111 mmol/L   CO2 25 22 - 32 mmol/L   Glucose, Bld 132 (H) 70 - 99 mg/dL   BUN 10 6 - 20 mg/dL   Creatinine, Ser 1.61 (H) 0.44 - 1.00 mg/dL   Calcium 8.8 (L) 8.9 - 10.3 mg/dL   Total Protein 7.2 6.5 - 8.1 g/dL   Albumin 2.8 (L) 3.5 - 5.0 g/dL   AST 33 15 - 41 U/L   ALT 38 0 - 44 U/L   Alkaline Phosphatase 105 38 - 126 U/L   Total Bilirubin 1.1 0.3 - 1.2 mg/dL   GFR calc non Af Amer 58 (L) >60 mL/min   GFR calc Af Amer >60 >60 mL/min   Anion gap 9 5 - 15  CBC     Status: Abnormal   Collection Time: 12/10/17  2:24 PM  Result Value Ref Range   WBC 19.2 (H) 4.0 - 10.5 K/uL   RBC 4.43 3.87 - 5.11 MIL/uL   Hemoglobin 10.4 (L) 12.0 - 15.0 g/dL   HCT 09.6 (L) 04.5 - 40.9 %   MCV 75.6 (L) 80.0 - 100.0 fL   MCH 23.5 (L) 26.0 - 34.0 pg   MCHC 31.0 30.0 - 36.0 g/dL   RDW 81.1 (H) 91.4 - 78.2 %   Platelets 411 (H) 150 - 400 K/uL   nRBC 0.0 0.0 - 0.2 %  Urinalysis, Complete w Microscopic     Status: Abnormal   Collection Time: 12/10/17  2:24 PM  Result Value Ref Range   Color, Urine YELLOW (A) YELLOW   APPearance HAZY (A) CLEAR   Specific Gravity, Urine 1.002 (L) 1.005 - 1.030   pH 7.0 5.0 - 8.0   Glucose, UA NEGATIVE NEGATIVE mg/dL   Hgb urine dipstick MODERATE (A) NEGATIVE   Bilirubin Urine NEGATIVE NEGATIVE   Ketones, ur NEGATIVE NEGATIVE mg/dL   Protein, ur NEGATIVE NEGATIVE mg/dL   Nitrite NEGATIVE NEGATIVE   Leukocytes, UA LARGE (A) NEGATIVE   RBC / HPF 0-5 0 - 5 RBC/hpf   WBC, UA >50 (H) 0 - 5 WBC/hpf   Bacteria, UA RARE (A) NONE  SEEN   Squamous Epithelial / LPF 6-10 0 - 5  Pregnancy, urine     Status: None   Collection Time: 12/10/17  2:24 PM  Result Value Ref Range   Preg Test, Ur NEGATIVE NEGATIVE   Recent Results (from the past 240 hour(s))  Stone analysis     Status: None   Collection Time: 12/03/17  10:45 AM  Result Value Ref Range Status   Color Tan  Final   Size Comment mm Final    Comment: Specimen received as fragments.   Stone Weight KSTONE 26.0 mg Corrected   Nidus No Nidus visualized  Corrected   Ca Oxalate,Monohydr. 60 % Corrected   Ca phos cry stone ql IR 40 % Corrected   Composition Comment  Corrected    Comment: Percentage (Represents the % composition)   STONE COMMENT Note:  Corrected    Comment: (NOTE) Please do not submit specimens on Q-Tips, in tape, on filters, or in liquids such as blood, urine or formalin.  This may cause unnecessary biohazards, erroneous results and/or delay in the processing of the specimen.    Photo Comment  Corrected    Comment: Photograph will follow under separate cover.   Comment: Comment  Corrected    Comment: (NOTE) Physician questions regarding Calculi Analysis contact [facility] at: [phone].    PLEASE NOTE: Comment  Corrected    Comment: (NOTE) Calculi report with photograph will follow via computer, mail or courier delivery.    Disclaimer - Kidney Stone Analysis: Comment  Corrected    Comment: (NOTE) This test was developed and its performance characteristics determined by [facility]. It has not been cleared or approved by the Food and Drug Administration. Performed At: [facility] [address] [place], [place] [id number] Jolene Schimke MD [id number]   Microscopic Examination     Status: Abnormal   Collection Time: 12/10/17  1:26 PM  Result Value Ref Range Status   WBC, UA >30 (A) 0 - 5 /hpf Final   RBC, UA 3-10 (A) 0 - 2 /hpf Final   Epithelial Cells (non renal) 0-10 0 - 10 /hpf Final   Bacteria, UA Moderate (A) None  seen/Few Final   Creatinine: Recent Labs    12/04/17 0451 12/10/17 1424  CREATININE 0.81 1.12*   CT scan personally reviewed and is discussed in the history of present illness  Impression/Assessment:  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  Agree with empiric antibiotics with ceftriaxone.  Consult internal medicine for admission for treatment of postprocedural urinary tract infection, complicated.  Recommend scheduled Toradol.  Also recommend Flomax which can help relax the ureter and decrease spasms.    Ray Church, III 12/10/2017, 7:08 PM

## 2017-12-10 NOTE — ED Triage Notes (Addendum)
Pt reports that she had kidney stone surgery Friday and since then has been having lower abd pain. Pain has worsened since Monday, pt reports that she has only had one small BM since surgery. Has had low grade temp off and on since surgery.

## 2017-12-10 NOTE — ED Provider Notes (Signed)
[facility] Emergency Department Provider Note  ____________________________________________  Time seen: Approximately 4:44 PM  I have reviewed the triage vital signs and the nursing notes.   HISTORY  Chief Complaint Abdominal Pain    HPI Candice Robinson is a 47 y.o. female with a history of staghorn kidney stone recently operated on  December 03, 2017 who complains of worsening abdominal pain over the past 48 hours associated with constipation.  She tried taking MiraLAX and suppository without relief.  No vomiting.  Pain is constant, waxing waning, severe without aggravating or alleviating factors.  She also notes intermittent low-grade fever, T-max of about 101 at home.  Denies chest pain shortness of breath.  No dizziness or syncope.  She notes this pain feels different from the pain she had after her surgery.     Past Medical History:  Diagnosis Date  . Kidney stone      Patient Active Problem List   Diagnosis Date Noted  . Stone, kidney 12/03/2017  . Staghorn renal calculus 11/16/2017  . Abnormal thyroid blood test 05/17/2015     Past Surgical History:  Procedure Laterality Date  . CYSTOSCOPY WITH STENT PLACEMENT Right 12/03/2017   Procedure: CYSTOSCOPY WITH STENT PLACEMENT;  Surgeon: Sondra Come, MD;  Location: [facility] ORS;  Service: Urology;  Laterality: Right;  . HOLMIUM LASER APPLICATION Right 12/03/2017   Procedure: HOLMIUM LASER APPLICATION;  Surgeon: Sondra Come, MD;  Location: [facility] ORS;  Service: Urology;  Laterality: Right;  . IR NEPHROSTOMY PLACEMENT RIGHT  12/03/2017  . NEPHROLITHOTOMY Right 12/03/2017   Procedure: NEPHROLITHOTOMY PERCUTANEOUS;  Surgeon: Sondra Come, MD;  Location: [facility] ORS;  Service: Urology;  Laterality: Right;  . TUBAL LIGATION  1996  . tummy tuck  2004     Prior to Admission medications   Medication Sig Start Date End Date Taking? Authorizing Provider  diphenhydrAMINE (BENADRYL) 25 MG tablet Take 25  mg by mouth at bedtime as needed for allergies.   Yes [provider]  HYDROcodone-acetaminophen (NORCO/VICODIN) 5-325 MG tablet Take 1-2 tablets by mouth every 4 (four) hours as needed for up to 7 days for moderate pain. 12/03/17 12/10/17 Yes Sondra Come, MD  ibuprofen (ADVIL,MOTRIN) 200 MG tablet Take 400 mg by mouth every 4 (four) hours while awake.    Yes [provider]  oxybutynin (DITROPAN) 5 MG tablet Take 1 tablet (5 mg total) by mouth every 8 (eight) hours as needed for bladder spasms. 12/06/17 01/05/18 Yes Sondra Come, MD  tamsulosin (FLOMAX) 0.4 MG CAPS capsule Take 1 capsule (0.4 mg total) by mouth daily. 12/06/17  Yes Sondra Come, MD  Probiotic Product (PROBIOTIC DAILY PO) Take 1 capsule by mouth daily.     [provider]     Allergies Codeine and Gluten meal   Family History  Problem Relation Age of Onset  . Diabetes Mother   . Hyperlipidemia Mother   . Hypertension Mother   . Hyperlipidemia Father   . Hypertension Father   . Heart disease Father   . Lupus Sister   . Diabetes Sister   . Hypertension Sister   . Breast cancer Maternal Aunt   . Breast cancer Maternal Aunt   . Bladder Cancer Neg Hx   . Kidney cancer Neg Hx     Social History Social History   Tobacco Use  . Smoking status: Never Smoker  . Smokeless tobacco: Never Used  Substance Use Topics  . Alcohol use: Yes  Comment: socially  . Drug use: No    Review of Systems  Constitutional: Positive fever ENT:   No sore throat. No rhinorrhea. Cardiovascular:   No chest pain or syncope. Respiratory:   No dyspnea or cough. Gastrointestinal:   Positive generalized abdominal pain and constipation.  No vomiting.  Musculoskeletal:   Negative for focal pain or swelling All other systems reviewed and are negative except as documented above in ROS and HPI.  ____________________________________________   PHYSICAL EXAM:  VITAL SIGNS: ED Triage Vitals [12/10/17  1423]  Enc Vitals Group     BP 133/67     Pulse Rate (!) 127     Resp 16     Temp 99.8 F (37.7 C)     Temp Source Oral     SpO2 100 %     Weight 165 lb 5.5 oz (75 kg)     Height 5\' 3"  (1.6 m)     Head Circumference      Peak Flow      Pain Score 7     Pain Loc      Pain Edu?      Excl. in GC?     Vital signs reviewed, nursing assessments reviewed.   Constitutional:   Alert and oriented. Non-toxic appearance. Eyes:   Conjunctivae are normal. EOMI. PERRL. ENT      Head:   Normocephalic and atraumatic.      Nose:   No congestion/rhinnorhea.       Mouth/Throat:   Dry mucous membranes, no pharyngeal erythema. No peritonsillar mass.       Neck:   No meningismus. Full ROM. Hematological/Lymphatic/Immunilogical:   No cervical lymphadenopathy. Cardiovascular:   Tachycardia heart rate 120. Symmetric bilateral radial and DP pulses.  No murmurs. Cap refill less than 2 seconds. Respiratory:   Normal respiratory effort without tachypnea/retractions. Breath sounds are clear and equal bilaterally. No wheezes/rales/rhonchi. Gastrointestinal:   Soft with generalized tenderness worse on the right.  Mildly distended. There is no CVA tenderness.  No rebound, rigidity, or guarding.  Prior nephrostomy site on the right back is nontender, noninflamed. Musculoskeletal:   Normal range of motion in all extremities. No joint effusions.  No lower extremity tenderness.  No edema. Neurologic:   Normal speech and language.  Motor grossly intact. No acute focal neurologic deficits are appreciated.  Skin:    Skin is warm, dry and intact. No rash noted.  No petechiae, purpura, or bullae.  ____________________________________________    LABS (pertinent positives/negatives) (all labs ordered are listed, but only abnormal results are displayed) Labs Reviewed  COMPREHENSIVE METABOLIC PANEL - Abnormal; Notable for the following components:      Result Value   Glucose, Bld 132 (*)    Creatinine, Ser 1.12  (*)    Calcium 8.8 (*)    Albumin 2.8 (*)    GFR calc non Af Amer 58 (*)    All other components within normal limits  CBC - Abnormal; Notable for the following components:   WBC 19.2 (*)    Hemoglobin 10.4 (*)    HCT 33.5 (*)    MCV 75.6 (*)    MCH 23.5 (*)    RDW 16.3 (*)    Platelets 411 (*)    All other components within normal limits  URINALYSIS, COMPLETE (UACMP) WITH MICROSCOPIC - Abnormal; Notable for the following components:   Color, Urine YELLOW (*)    APPearance HAZY (*)    Specific Gravity, Urine 1.002 (*)  Hgb urine dipstick MODERATE (*)    Leukocytes, UA LARGE (*)    WBC, UA >50 (*)    Bacteria, UA RARE (*)    All other components within normal limits  URINE CULTURE  LIPASE, BLOOD  PREGNANCY, URINE  LACTIC ACID, PLASMA  LACTIC ACID, PLASMA  POC URINE PREG, ED   ____________________________________________   EKG    ____________________________________________    RADIOLOGY  Ct Abdomen Pelvis W Contrast  Result Date: 12/10/2017 CLINICAL DATA:  Acute generalized abdominal pain, recent kidney stone surgery on Friday, since that having lower abdominal pain worsened since Monday EXAM: CT ABDOMEN AND PELVIS WITH CONTRAST TECHNIQUE: Multidetector CT imaging of the abdomen and pelvis was performed using the standard protocol following bolus administration of intravenous contrast. Sagittal and coronal MPR images reconstructed from axial data set. CONTRAST:  ISOVUE-300 IOPAMIDOL (ISOVUE-300) INJECTION 61% IV. No oral contrast. COMPARISON:  11/09/2017 FINDINGS: Lower chest: Small RIGHT pleural effusion. Bibasilar atelectasis greater on RIGHT. Hepatobiliary: Liver and gallbladder normal appearance Pancreas: Normal appearance Spleen: Normal appearance Adrenals/Urinary Tract: Adrenal glands normal appearance. Tiny cyst LEFT kidney without dominant mass or hydronephrosis. Large staghorn calculi within RIGHT kidney measuring up to 3.0 cm diameter. RIGHT ureteral stent  extends from renal pelvis into urinary bladder. RIGHT hydronephrosis and hydroureter. Enhancing wall of the RIGHT renal pelvis and RIGHT ureter could be related to prior surgery or urinary tract infection. Mild delay in RIGHT nephrogram. Linear defect within the cortex of the mid RIGHT kidney consistent with access site for preceding percutaneous procedure. Significant edema in the RIGHT anterior pararenal and RIGHT perinephric spaces. Focal fluid collection dependently within the RIGHT perinephric space inferior to the kidney, 6.4 x 2.9 cm in greatest axial dimensions and extending for 15.8 cm length. Delayed images demonstrate a small amount of excreted contrast material within this collection and traversing the percutaneous tract through the posterolateral mid RIGHT kidney, likely representing. Edema in the anterior RIGHT pararenal space extends adjacent to the ascending colon and duodenum though these do not appear to be the epicenter of the process. Small RIGHT renal cyst. Large stone fragment within the mid to distal RIGHT ureter, 11 x 8 mm in axial dimensions extending 19 mm length. LEFT ureter and bladder unremarkable. Stomach/Bowel: Stomach and bowel loops otherwise normal appearance Vascular/Lymphatic: Aorta normal caliber. No adenopathy. Few normal to upper normal sized retroperitoneal nodes up to 10 mm diameter. Reproductive: Unremarkable uterus and ovaries Other: Small amount of free fluid in pelvis. Scattered stranding of RIGHT pelvic tissue planes by edema. No hernia or free air. Musculoskeletal: Osseous structures unremarkable IMPRESSION: RIGHT hydronephrosis and hydroureter despite ureteral stent. Large staghorn calculi RIGHT kidney with a large stone fragment at the dilated distal LEFT ureter adjacent to the stent, 11 x 8 x 19 mm in size. Enhancing walls of renal pelvis and ureter could be due to surgery or urinary tract infection, recommend correlation with urinalysis. Fluid collection in the  dependent portion of the RIGHT pararenal space 6.4 x 2.9 x 15.8 cm with evidence of excreted contrast material within this collection through the percutaneous track from recent RIGHT nephrolithotomy consistent with urinary extravasation and urinoma. Additional edema in the RIGHT anterior pararenal space as well as small RIGHT pleural effusion and RIGHT basilar atelectasis. Electronically Signed   By: Ulyses Southward M.D.   On: 12/10/2017 17:08    ____________________________________________   PROCEDURES .Critical Care Performed by: Sharman Cheek, MD Authorized by: Sharman Cheek, MD   Critical care provider statement:  Critical care time (minutes):  30   Critical care time was exclusive of:  Separately billable procedures and treating other patients   Critical care was necessary to treat or prevent imminent or life-threatening deterioration of the following conditions:  Sepsis   Critical care was time spent personally by me on the following activities:  Development of treatment plan with patient or surrogate, discussions with consultants, evaluation of patient's response to treatment, examination of patient, obtaining history from patient or surrogate, ordering and performing treatments and interventions, ordering and review of laboratory studies, ordering and review of radiographic studies, pulse oximetry, re-evaluation of patient's condition and review of old charts    ____________________________________________  DIFFERENTIAL DIAGNOSIS   Appendicitis, intra-abdominal abscess, constipation, obstructed ureteral stent, pyelonephritis, ureterolithiasis, colitis  CLINICAL IMPRESSION / ASSESSMENT AND PLAN / ED COURSE  Pertinent labs & imaging results that were available during my care of the patient were reviewed by me and considered in my medical decision making (see chart for details).    Patient presents with low-grade fever at home, severe abdominal pain which is evolving and  worsening since her recent urological procedure.  Here she does not have a fever but is tachycardic.  She is not septic on arrival.  She will need a CT scan to evaluate for acute intra-abdominal pathology or surgical complication, and to give IV fluids for hydration and fentanyl 50 mcg IV for pain relief.  Clinical Course as of Dec 11 1903  Fri Dec 10, 2017  1801 CT and lab findings discussed with urologist Dr. Alvester Morin, will review the patient's results and call back with recommendations   [PS]  1834 Discussed again with Dr. Alvester Morin, recommends ceftriaxone, Foley catheter, admission to hospitalist service.  Plans to consult.  Patient and family updated.  Pain is worsening so give her IV Dilaudid 1 mg as well.   [PS]    Clinical Course User Index [PS] Sharman Cheek, MD     ----------------------------------------- 7:05 PM on 12/10/2017 -----------------------------------------  On recheck of vital signs, she is febrile to 100.9 with persistent tachycardia, identified urine source of infection consistent with diagnosis of sepsis at this time.  Patient is already received IV ceftriaxone, so unable to obtain reliable blood cultures.  We will check a lactic acid to evaluate for shock.  No hypotension.  ____________________________________________   FINAL CLINICAL IMPRESSION(S) / ED DIAGNOSES    Final diagnoses:  Sepsis without acute organ dysfunction, due to unspecified organism [facility])  Ureteral obstruction, right  Staghorn calculus     ED Discharge Orders    None      Portions of this note were generated with dragon dictation software. Dictation errors may occur despite best attempts at proofreading.    Sharman Cheek, MD 12/10/17 [phone]

## 2017-12-10 NOTE — ED Notes (Signed)
Patient reports abdominal pain since kidney surgery on Friday but states pain now feels different and worse.

## 2017-12-10 NOTE — H&P (Signed)
[employer] - [place] [facility]   PATIENT NAME: Candice Robinson    MR#:  [id number]  DATE OF BIRTH:  01-13-1971  DATE OF ADMISSION:  12/10/2017  PRIMARY CARE PHYSICIAN: Dorcas Carrow, DO   REQUESTING/REFERRING PHYSICIAN: Dr. Scotty Court.  CHIEF COMPLAINT:   Chief Complaint  Patient presents with  . Abdominal Pain   Right-sided lower abdominal pain 2 to 3 days. HISTORY OF PRESENT ILLNESS:  Candice Robinson  is a 47 y.o. female with a known history of kidney stone, status supposed stent placement 5 days ago.  She presents the ED with above chief complaints.  She also complains of fever and chills.  She denies any nausea or vomiting or diarrhea.  No dysuria hematuria.  She was found to sepsis with tachycardia, leukocytosis, UTI and the right side hydronephrosis.  Dr. Scotty Court discussed with urologist and request admission.  PAST MEDICAL HISTORY:   Past Medical History:  Diagnosis Date  . Kidney stone     PAST SURGICAL HISTORY:   Past Surgical History:  Procedure Laterality Date  . CYSTOSCOPY WITH STENT PLACEMENT Right 12/03/2017   Procedure: CYSTOSCOPY WITH STENT PLACEMENT;  Surgeon: Sondra Come, MD;  Location: [facility] ORS;  Service: Urology;  Laterality: Right;  . HOLMIUM LASER APPLICATION Right 12/03/2017   Procedure: HOLMIUM LASER APPLICATION;  Surgeon: Sondra Come, MD;  Location: [facility] ORS;  Service: Urology;  Laterality: Right;  . IR NEPHROSTOMY PLACEMENT RIGHT  12/03/2017  . NEPHROLITHOTOMY Right 12/03/2017   Procedure: NEPHROLITHOTOMY PERCUTANEOUS;  Surgeon: Sondra Come, MD;  Location: [facility] ORS;  Service: Urology;  Laterality: Right;  . TUBAL LIGATION  1996  . tummy tuck  2004    SOCIAL HISTORY:   Social History   Tobacco Use  . Smoking status: Never Smoker  . Smokeless tobacco: Never Used  Substance Use Topics  . Alcohol use: Yes    Comment: socially    FAMILY HISTORY:   Family History  Problem Relation Age of Onset  .  Diabetes Mother   . Hyperlipidemia Mother   . Hypertension Mother   . Hyperlipidemia Father   . Hypertension Father   . Heart disease Father   . Lupus Sister   . Diabetes Sister   . Hypertension Sister   . Breast cancer Maternal Aunt   . Breast cancer Maternal Aunt   . Bladder Cancer Neg Hx   . Kidney cancer Neg Hx     DRUG ALLERGIES:   Allergies  Allergen Reactions  . Codeine Other (See Comments)    Hallucinations  . Gluten Meal     Stomach bloating, indigestion     REVIEW OF SYSTEMS:   Review of Systems  Constitutional: Positive for chills and fever. Negative for malaise/fatigue.  HENT: Negative for sore throat.   Eyes: Negative for blurred vision and double vision.  Respiratory: Negative for cough, hemoptysis, shortness of breath, wheezing and stridor.   Cardiovascular: Negative for chest pain, palpitations, orthopnea and leg swelling.  Gastrointestinal: Positive for abdominal pain. Negative for blood in stool, diarrhea, melena, nausea and vomiting.  Genitourinary: Negative for dysuria, flank pain and hematuria.  Musculoskeletal: Negative for back pain and joint pain.  Skin: Negative for rash.  Neurological: Negative for dizziness, sensory change, focal weakness, seizures, loss of consciousness, weakness and headaches.  Endo/Heme/Allergies: Negative for polydipsia.  Psychiatric/Behavioral: Negative for depression. The patient is not nervous/anxious.     MEDICATIONS AT HOME:   Prior to Admission medications   Medication  Sig Start Date End Date Taking? Authorizing Provider  diphenhydrAMINE (BENADRYL) 25 MG tablet Take 25 mg by mouth at bedtime as needed for allergies.   Yes [provider]  HYDROcodone-acetaminophen (NORCO/VICODIN) 5-325 MG tablet Take 1-2 tablets by mouth every 4 (four) hours as needed for up to 7 days for moderate pain. 12/03/17 12/10/17 Yes Sondra Come, MD  ibuprofen (ADVIL,MOTRIN) 200 MG tablet Take 400 mg by mouth every 4 (four)  hours while awake.    Yes [provider]  oxybutynin (DITROPAN) 5 MG tablet Take 1 tablet (5 mg total) by mouth every 8 (eight) hours as needed for bladder spasms. 12/06/17 01/05/18 Yes Sondra Come, MD  tamsulosin (FLOMAX) 0.4 MG CAPS capsule Take 1 capsule (0.4 mg total) by mouth daily. 12/06/17  Yes Sondra Come, MD  Probiotic Product (PROBIOTIC DAILY PO) Take 1 capsule by mouth daily.     [provider]      VITAL SIGNS:  Blood pressure 139/68, pulse (!) 119, temperature (!) 100.9 F (38.3 C), temperature source Oral, resp. rate (!) 24, height 5\' 3"  (1.6 m), weight 75 kg, last menstrual period 12/06/2017, SpO2 96 %.  PHYSICAL EXAMINATION:  Physical Exam  GENERAL:  47 y.o.-year-old patient lying in the bed with no acute distress.  EYES: Pupils equal, round, reactive to light and accommodation. No scleral icterus. Extraocular muscles intact.  HEENT: Head atraumatic, normocephalic. Oropharynx and nasopharynx clear.  NECK:  Supple, no jugular venous distention. No thyroid enlargement, no tenderness.  LUNGS: Normal breath sounds bilaterally, no wheezing, rales,rhonchi or crepitation. No use of accessory muscles of respiration.  CARDIOVASCULAR: S1, S2 normal. No murmurs, rubs, or gallops.  ABDOMEN: Soft, tenderness on right lower abdomen, nondistended. Bowel sounds present. No organomegaly or mass.  EXTREMITIES: No pedal edema, cyanosis, or clubbing.  NEUROLOGIC: Cranial nerves II through XII are intact. Muscle strength 5/5 in all extremities. Sensation intact. Gait not checked.  PSYCHIATRIC: The patient is alert and oriented x 3.  SKIN: No obvious rash, lesion, or ulcer.   LABORATORY PANEL:   CBC Recent Labs  Lab 12/10/17 1424  WBC 19.2*  HGB 10.4*  HCT 33.5*  PLT 411*   ------------------------------------------------------------------------------------------------------------------  Chemistries  Recent Labs  Lab 12/10/17 1424  NA 135  K 3.9  CL  101  CO2 25  GLUCOSE 132*  BUN 10  CREATININE 1.12*  CALCIUM 8.8*  AST 33  ALT 38  ALKPHOS 105  BILITOT 1.1   ------------------------------------------------------------------------------------------------------------------  Cardiac Enzymes No results for input(s): TROPONINI in the last 168 hours. ------------------------------------------------------------------------------------------------------------------  RADIOLOGY:  Ct Abdomen Pelvis W Contrast  Result Date: 12/10/2017 CLINICAL DATA:  Acute generalized abdominal pain, recent kidney stone surgery on Friday, since that having lower abdominal pain worsened since Monday EXAM: CT ABDOMEN AND PELVIS WITH CONTRAST TECHNIQUE: Multidetector CT imaging of the abdomen and pelvis was performed using the standard protocol following bolus administration of intravenous contrast. Sagittal and coronal MPR images reconstructed from axial data set. CONTRAST:  ISOVUE-300 IOPAMIDOL (ISOVUE-300) INJECTION 61% IV. No oral contrast. COMPARISON:  11/09/2017 FINDINGS: Lower chest: Small RIGHT pleural effusion. Bibasilar atelectasis greater on RIGHT. Hepatobiliary: Liver and gallbladder normal appearance Pancreas: Normal appearance Spleen: Normal appearance Adrenals/Urinary Tract: Adrenal glands normal appearance. Tiny cyst LEFT kidney without dominant mass or hydronephrosis. Large staghorn calculi within RIGHT kidney measuring up to 3.0 cm diameter. RIGHT ureteral stent extends from renal pelvis into urinary bladder. RIGHT hydronephrosis and hydroureter. Enhancing wall of the RIGHT renal pelvis  and RIGHT ureter could be related to prior surgery or urinary tract infection. Mild delay in RIGHT nephrogram. Linear defect within the cortex of the mid RIGHT kidney consistent with access site for preceding percutaneous procedure. Significant edema in the RIGHT anterior pararenal and RIGHT perinephric spaces. Focal fluid collection dependently within the RIGHT  perinephric space inferior to the kidney, 6.4 x 2.9 cm in greatest axial dimensions and extending for 15.8 cm length. Delayed images demonstrate a small amount of excreted contrast material within this collection and traversing the percutaneous tract through the posterolateral mid RIGHT kidney, likely representing. Edema in the anterior RIGHT pararenal space extends adjacent to the ascending colon and duodenum though these do not appear to be the epicenter of the process. Small RIGHT renal cyst. Large stone fragment within the mid to distal RIGHT ureter, 11 x 8 mm in axial dimensions extending 19 mm length. LEFT ureter and bladder unremarkable. Stomach/Bowel: Stomach and bowel loops otherwise normal appearance Vascular/Lymphatic: Aorta normal caliber. No adenopathy. Few normal to upper normal sized retroperitoneal nodes up to 10 mm diameter. Reproductive: Unremarkable uterus and ovaries Other: Small amount of free fluid in pelvis. Scattered stranding of RIGHT pelvic tissue planes by edema. No hernia or free air. Musculoskeletal: Osseous structures unremarkable IMPRESSION: RIGHT hydronephrosis and hydroureter despite ureteral stent. Large staghorn calculi RIGHT kidney with a large stone fragment at the dilated distal LEFT ureter adjacent to the stent, 11 x 8 x 19 mm in size. Enhancing walls of renal pelvis and ureter could be due to surgery or urinary tract infection, recommend correlation with urinalysis. Fluid collection in the dependent portion of the RIGHT pararenal space 6.4 x 2.9 x 15.8 cm with evidence of excreted contrast material within this collection through the percutaneous track from recent RIGHT nephrolithotomy consistent with urinary extravasation and urinoma. Additional edema in the RIGHT anterior pararenal space as well as small RIGHT pleural effusion and RIGHT basilar atelectasis. Electronically Signed   By: Ulyses Southward M.D.   On: 12/10/2017 17:08      IMPRESSION AND PLAN:   Sepsis due to  UTI. The patient will be admitted to medical floor. Start sepsis protocol, cefepime IV pharmacy to dose, IV fluid support, pain control, follow-up CBC and blood culture.  Nephrolithiasis with right-sided hydronephrosis, Staghorn calculus, ureteral obstruction. Follow-up urologist for further recommendation.   All the records are reviewed and case discussed with ED provider. Management plans discussed with the patient, her husband and daughter and they are in agreement.  CODE STATUS: Full code  TOTAL TIME TAKING CARE OF THIS PATIENT: 40 minutes.    Shaune Pollack M.D on 12/10/2017 at 7:20 PM  Between 7am to 6pm - Pager - [phone]  After 6pm go to [email or web address] - Social research officer, government  [facility] [place] [facility]  Office  [phone]  CC: Primary care physician; Dorcas Carrow, DO   Note: This dictation was prepared with Dragon dictation along with smaller phrase technology. Any transcriptional errors that result from this process are unin

## 2017-12-10 NOTE — Progress Notes (Signed)
Pharmacy Antibiotic Note  Candice Robinson is a 47 y.o. female admitted on 12/10/2017 with sepsis/UTI.  Pharmacy has been consulted for cefepime dosing.  Plan: Cefepime 2 gm IV Q12H  Height: 5\' 3"  (160 cm) Weight: 165 lb 5.5 oz (75 kg) IBW/kg (Calculated) : 52.4  Temp (24hrs), Avg:100.3 F (37.9 C), Min:99.8 F (37.7 C), Max:100.9 F (38.3 C)  Recent Labs  Lab 12/04/17 0451 12/10/17 1331 12/10/17 1424  WBC 18.0* 17.0* 19.2*  CREATININE 0.81  --  1.12*    Estimated Creatinine Clearance: 60.2 mL/min (A) (by C-G formula based on SCr of 1.12 mg/dL (H)).    Allergies  Allergen Reactions  . Codeine Other (See Comments)    Hallucinations  . Gluten Meal     Stomach bloating, indigestion     Antimicrobials this admission:   Dose adjustments this admission:   Microbiology results:  BCx:   UCx:    Sputum:    MRSA PCR:   Thank you for allowing pharmacy to be a part of this patient's care.  Carola Frost, Pharm.D., BCPS Clinical Pharmacist 12/10/2017 7:34 PM

## 2017-12-10 NOTE — Progress Notes (Signed)
BP 125/80   Pulse (!) 120   Temp 100.3 F (37.9 C) (Oral)   Ht 5\' 3"  (1.6 m)   Wt 165 lb 12.8 oz (75.2 kg)   LMP 12/06/2017   SpO2 97%   BMI 29.37 kg/m    Subjective:    Patient ID: Candice Robinson, female    DOB: 1970/07/13, 47 y.o.   MRN: [id number]  HPI: Candice Robinson is a 47 y.o. female  Chief Complaint  Patient presents with  . Abdominal Pain    pt states she has been having abdominal pain since surgery 12/03/17. States she has not had a BM other than a small amount last night. States she has tried 2 laxatives and an enema   Here today with constipation, right lower abdominal pain, intermittent fevers, fatigue the past 3-4 days. 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 Recent new hx of recurrent UTIs along with this stone. Has only passed a small amount of stool once since procedure despite miralax, suppositories, enemas, and stimulant laxative. Called Urology yesterday who recommended she see PCP if no BM by today.   Relevant past medical, surgical, family and social history reviewed and updated as indicated. Interim medical history since our last visit reviewed. Allergies and medications reviewed and updated.  Review of Systems  Per HPI unless specifically indicated above     Objective:    BP 125/80   Pulse (!) 120   Temp 100.3 F (37.9 C) (Oral)   Ht 5\' 3"  (1.6 m)   Wt 165 lb 12.8 oz (75.2 kg)   LMP 12/06/2017   SpO2 97%   BMI 29.37 kg/m   Wt Readings from Last 3 Encounters:  12/10/17 170 lb (77.1 kg)  12/10/17 165 lb 12.8 oz (75.2 kg)  12/03/17 165 lb (74.8 kg)    Physical Exam  Constitutional: She is oriented to person, place, and time. She appears well-developed and well-nourished. She appears distressed (appears ill, in pain, pale).  HENT:  Head: Atraumatic.  Eyes: Conjunctivae and EOM are normal.  Cardiovascular: Regular rhythm and normal heart sounds.  Tachycardic   Pulmonary/Chest: Effort  normal and breath sounds normal.  Abdominal: Soft. There is tenderness (significant RLQ ttp extending toward mid abdomen). There is guarding. There is no rebound.  Hyperactive bowel sounds   Musculoskeletal:  Gait antalgic  Neurological: She is alert and oriented to person, place, and time.  Skin: Skin is warm. She is diaphoretic.  Psychiatric:  Flat affect, appears ill  Nursing note and vitals reviewed.   Results for orders placed or performed in visit on 12/10/17  Microscopic Examination  Result Value Ref Range   WBC, UA >30 (A) 0 - 5 /hpf   RBC, UA 3-10 (A) 0 - 2 /hpf   Epithelial Cells (non renal) 0-10 0 - 10 /hpf   Bacteria, UA Moderate (A) None seen/Few  UA/M w/rflx Culture, Routine  Result Value Ref Range   Specific Gravity, UA 1.020 1.005 - 1.030   pH, UA 6.0 5.0 - 7.5   Color, UA Orange Yellow   Appearance Ur Turbid (A) Clear   Leukocytes, UA 3+ (A) Negative   Protein, UA 2+ (A) Negative/Trace   Glucose, UA Negative Negative   Ketones, UA Trace (A) Negative   RBC, UA 3+ (A) Negative   Bilirubin, UA Negative Negative   Urobilinogen, Ur 0.2 0.2 - 1.0 mg/dL   Nitrite, UA Negative Negative  Microscopic Examination See below:   CBC With Differential/Platelet  Result Value Ref Range   WBC 17.0 (H) 3.4 - 10.8 x10E3/uL   RBC 4.36 3.77 - 5.28 x10E6/uL   Hemoglobin 10.7 (L) 11.1 - 15.9 g/dL   Hematocrit 16.1 (L) 09.6 - 46.6 %   MCV 76 (L) 79 - 97 fL   MCH 24.5 (L) 26.6 - 33.0 pg   MCHC 32.4 31.5 - 35.7 g/dL   RDW 04.5 (H) 40.9 - 81.1 %   Platelets 436 150 - 450 x10E3/uL   Neutrophils 85 Not Estab. %   Lymphs 10 Not Estab. %   MID 5 Not Estab. %   Neutrophils Absolute 14.4 (H) 1.4 - 7.0 x10E3/uL   Lymphocytes Absolute 1.7 0.7 - 3.1 x10E3/uL   MID (Absolute) 0.9 0.1 - 1.6 X10E3/uL      Assessment & Plan:   Problem List Items Addressed This Visit    None    Visit Diagnoses    Lower abdominal pain    -  Primary   Relevant Orders   UA/M w/rflx Culture, Routine  (Completed)   Fever, unspecified fever cause       Relevant Orders   CBC With Differential/Platelet (Completed)   Leukocytosis, unspecified type       Acute lower UTI       Constipation, unspecified constipation type        Patient presents today febrile, tachycardic, and ill appearing. She is most concerned about the constipation and feels this is where her pain is coming from. On exam, she has guarding, moderate RLQ ttp and pallor. Her WBC count is 17,000 and she has a significant UTI per POC labs. Recommended pt proceed directly to the ER for further workup/treatment for probable sepsis d/t UTI. ER was called and is expecting her. Pt is here today with her mother and significant other who will transport her to the ER.   Greater than 40 min spent in direct care/education and coordination of care today.  Follow up plan: Return for ER f/u.

## 2017-12-10 NOTE — Progress Notes (Signed)
Patient and spouse were questioning whether or not patient should be stuck again by lab for blood cultures since patient had already received a dose of antibiotics in the ER before blood cultures were drawn. Husband asked RN to call the MD and asked if they still needed to be drawn. I spoke with Dr.Chen who verbalized that "yes"  The blood cultures should still be drawn. This was communicated to the husband who verbalized to Rn to "call Dr.Bell".  Dr.Bell was paged and I spoke with him via phone about the above situation. Dr.Bell also stated that the blood cultures should still be drawn. I asked Dr. Alvester Morin if he would please explain his rationale to the husband and stated he would. After speaking with Dr.Bell very briefly husband was upset and not happy with Dr.Bells response. The [facility] Judeth Cornfield was called to come and speak with the family and patient. Anselm Jungling

## 2017-12-10 NOTE — ED Notes (Signed)
MD in room to assess patient.  Will continue to monitor.   

## 2017-12-11 LAB — BASIC METABOLIC PANEL
Anion gap: 6 (ref 5–15)
BUN: 8 mg/dL (ref 6–20)
CHLORIDE: 106 mmol/L (ref 98–111)
CO2: 25 mmol/L (ref 22–32)
CREATININE: 0.79 mg/dL (ref 0.44–1.00)
Calcium: 8.2 mg/dL — ABNORMAL LOW (ref 8.9–10.3)
GFR calc non Af Amer: >60 mL/min (ref >60)
Glucose, Bld: 123 mg/dL — ABNORMAL HIGH (ref 70–99)
Potassium: 3.9 mmol/L (ref 3.5–5.1)
SODIUM: 137 mmol/L (ref 135–145)

## 2017-12-11 LAB — CBC
HEMATOCRIT: 29.8 % — AB (ref 36.0–46.0)
HEMOGLOBIN: 9.2 g/dL — AB (ref 12.0–15.0)
MCH: 23.5 pg — ABNORMAL LOW (ref 26.0–34.0)
MCHC: 30.9 g/dL (ref 30.0–36.0)
MCV: 76.2 fL — AB (ref 80.0–100.0)
NRBC: 0 % (ref 0.0–0.2)
Platelets: 378 10*3/uL (ref 150–400)
RBC: 3.91 MIL/uL (ref 3.87–5.11)
RDW: 16.7 % — AB (ref 11.5–15.5)
WBC: 19 10*3/uL — AB (ref 4.0–10.5)

## 2017-12-11 LAB — LACTIC ACID, PLASMA: Lactic Acid, Venous: 0.9 mmol/L (ref 0.5–1.9)

## 2017-12-11 MED ORDER — SULFAMETHOXAZOLE-TRIMETHOPRIM 800-160 MG PO TABS
1.0000 | ORAL_TABLET | Freq: Two times a day (BID) | ORAL | Status: DC
  Administered 2017-12-11 – 2017-12-12 (×3): 1 via ORAL
  Filled 2017-12-11 (×4): qty 1

## 2017-12-11 MED ORDER — ALUM & MAG HYDROXIDE-SIMETH 200-200-20 MG/5ML PO SUSP
15.0000 mL | ORAL | Status: DC | PRN
  Administered 2017-12-11: 15 mL via ORAL
  Filled 2017-12-11: qty 30

## 2017-12-11 MED ORDER — TRAMADOL HCL 50 MG PO TABS: 50.0000 mg | ORAL_TABLET | Freq: Four times a day (QID) | ORAL | Status: DC | PRN

## 2017-12-11 NOTE — Progress Notes (Signed)
I am seeing this patient in following her for fever, recent right-sided kidney stone procedure with stent placement and admission CT scan showing persistent right-sided hydronephrosis.  Interval: The patient states that she is feeling better.  Continues to have some right-sided abdominal discomfort, but otherwise is feeling better.  This did actually correlate with Foley catheter placement.  She is been afebrile since admission.  The patient denies any nausea.  She had a bowel movement this morning.  She otherwise feels okay.  Scheduled Meds: . acidophilus  1 capsule Oral Daily  . enoxaparin (LOVENOX) injection  40 mg Subcutaneous Q24H  . ketorolac  30 mg Intravenous Q6H  . sulfamethoxazole-trimethoprim  1 tablet Oral Q12H  . tamsulosin  0.4 mg Oral Daily   Continuous Infusions: . sodium chloride 150 mL/hr at 12/11/17 0605  . ceFEPime (MAXIPIME) IV Stopped (12/10/17 2301)   PRN Meds:.acetaminophen **OR** acetaminophen, albuterol, bisacodyl, diphenhydrAMINE, ondansetron **OR** ondansetron (ZOFRAN) IV, oxybutynin, promethazine, senna-docusate, traMADol Past Medical History:  Diagnosis Date  . Kidney stone    PE: NAD Vitals:   12/10/17 1945 12/10/17 2011 12/10/17 2152 12/11/17 0447  BP: 110/74 125/72 115/69 102/60  Pulse: (!) 117 (!) 114 97 72  Resp: 18 16 16 16   Temp:  98.6 F (37 C) 98.4 F (36.9 C) 97.8 F (36.6 C)  TempSrc:  Oral Oral Oral  SpO2: 95% 99% 97% 100%  Weight:  77.1 kg    Height:  5\' 3"  (1.6 m)      Intake/Output Summary (Last 24 hours) at 12/11/2017 0856 Last data filed at 12/11/2017 6962 Gross per 24 hour  Intake 2379.89 ml  Output 1100 ml  Net 1279.89 ml   Nonlabored breathing Abdomen is soft, slight tenderness to palpation in the right lower quadrant Lower extremities are symmetric without edema Foley catheter draining blood-tinged urine  Recent Labs    12/10/17 1331 12/10/17 1424 12/11/17 0037  WBC 17.0* 19.2* 19.0*  HGB 10.7* 10.4* 9.2*   HCT 33.0* 33.5* 29.8*   Recent Labs    12/10/17 1424 12/11/17 0037  NA 135 137  K 3.9 3.9  CL 101 106  CO2 25 25  GLUCOSE 132* 123*  BUN 10 8  CREATININE 1.12* 0.79  CALCIUM 8.8* 8.2*   Recent Labs    12/10/17 2120  INR 1.36   No results for input(s): PSA in the last 72 hours. No results for input(s): LABURIN in the last 72 hours. Results for orders placed or performed during the hospital encounter of 12/10/17  Culture, blood (x 2)     Status: None (Preliminary result)   Collection Time: 12/10/17  9:20 PM  Result Value Ref Range Status   Specimen Description BLOOD LEFT HAND  Final   Special Requests   Final    BOTTLES DRAWN AEROBIC AND ANAEROBIC Blood Culture adequate volume   Culture   Final    NO GROWTH < 12 HOURS Performed at [facility], [address], [place], [place] [address]    Report Status PENDING  Incomplete  Culture, blood (x 2)     Status: None (Preliminary result)   Collection Time: 12/10/17  9:33 PM  Result Value Ref Range Status   Specimen Description BLOOD RIGHT ANTECUBITAL  Final   Special Requests   Final    BOTTLES DRAWN AEROBIC AND ANAEROBIC Blood Culture adequate volume   Culture   Final    NO GROWTH < 12 HOURS Performed at [facility], [address] [place],  [place] [address]    Report Status PENDING  Incomplete    Imaging: I reviewed the patient's CT scan on admission and the other 2 CT scans that she had in September.  On 11/02/2017 she has hydronephrosis down to the bladder with some thickening of the urothelium.  On her CT scan from this admission she has persistent hydronephrosis and a stone in the distal ureter.  Impression: I suspect the patient has pyelonephritis which is causing her pain and fever.  Is also likely contributing to some of her hydroureteronephrosis.  Less likely is that the patient's ureteral stent has failed.  This would be extremely unlikely given the proximity to it being placed.  Fortunately,  the patient is feeling better and has been afebrile since admission.  Her labs are stable.  Recommendations: I had a long conversation with the patient about the management strategies.  Our plan is to remove the patient's Foley catheter and monitor her postvoid residuals.  As long as her postvoid residuals are low, she does not need the Foley catheter.  I also recommended that we transition her to oral antibiotics and see how she does over the next 24 hours on oral antibiotics.  I have chosen Bactrim based upon her recent urine cultures and its penetration into the kidney and urinary tract.  If the patient remains afebrile and her pain continues to improve, she can likely be discharged home tomorrow.  She will need 14 days of antibiotics to complete her course of treatment for pyelonephritis.  She then will need suppression antibiotics until her upcoming staged procedure to remove her kidney stones.  If the patient continues to have fevers and does not have any improvement over the next 24 hours I would consider taking her to the operating room for a stent exchange.  I will continue to follow along with this patient.

## 2017-12-11 NOTE — Progress Notes (Signed)
[facility]   PATIENT NAME: Candice Robinson    MR#:  [id number]  DATE OF BIRTH:  1970/08/03  SUBJECTIVE:  CHIEF COMPLAINT:   Chief Complaint  Patient presents with  . Abdominal Pain   Patient feeling better, urology input appreciated REVIEW OF SYSTEMS:  CONSTITUTIONAL: No fever, fatigue or weakness.  EYES: No blurred or double vision.  EARS, NOSE, AND THROAT: No tinnitus or ear pain.  RESPIRATORY: No cough, shortness of breath, wheezing or hemoptysis.  CARDIOVASCULAR: No chest pain, orthopnea, edema.  GASTROINTESTINAL: No nausea, vomiting, diarrhea or abdominal pain.  GENITOURINARY: No dysuria, hematuria.  ENDOCRINE: No polyuria, nocturia,  HEMATOLOGY: No anemia, easy bruising or bleeding SKIN: No rash or lesion. MUSCULOSKELETAL: No joint pain or arthritis.   NEUROLOGIC: No tingling, numbness, weakness.  PSYCHIATRY: No anxiety or depression.   ROS  DRUG ALLERGIES:   Allergies  Allergen Reactions  . Codeine Other (See Comments)    Hallucinations  . Gluten Meal     Stomach bloating, indigestion     VITALS:  Blood pressure 110/64, pulse 87, temperature 98.1 F (36.7 C), temperature source Oral, resp. rate 16, height 5\' 3"  (1.6 m), weight 77.1 kg, last menstrual period 12/06/2017, SpO2 97 %.  PHYSICAL EXAMINATION:  GENERAL:  47 y.o.-year-old patient lying in the bed with no acute distress.  EYES: Pupils equal, round, reactive to light and accommodation. No scleral icterus. Extraocular muscles intact.  HEENT: Head atraumatic, normocephalic. Oropharynx and nasopharynx clear.  NECK:  Supple, no jugular venous distention. No thyroid enlargement, no tenderness.  LUNGS: Normal breath sounds bilaterally, no wheezing, rales,rhonchi or crepitation. No use of accessory muscles of respiration.  CARDIOVASCULAR: S1, S2 normal. No murmurs, rubs, or gallops.  ABDOMEN: Soft, nontender, nondistended. Bowel sounds present. No organomegaly or mass.   EXTREMITIES: No pedal edema, cyanosis, or clubbing.  NEUROLOGIC: Cranial nerves II through XII are intact. Muscle strength 5/5 in all extremities. Sensation intact. Gait not checked.  PSYCHIATRIC: The patient is alert and oriented x 3.  SKIN: No obvious rash, lesion, or ulcer.   Physical Exam LABORATORY PANEL:   CBC Recent Labs  Lab 12/11/17 0037  WBC 19.0*  HGB 9.2*  HCT 29.8*  PLT 378   ------------------------------------------------------------------------------------------------------------------  Chemistries  Recent Labs  Lab 12/10/17 1424 12/11/17 0037  NA 135 137  K 3.9 3.9  CL 101 106  CO2 25 25  GLUCOSE 132* 123*  BUN 10 8  CREATININE 1.12* 0.79  CALCIUM 8.8* 8.2*  AST 33  --   ALT 38  --   ALKPHOS 105  --   BILITOT 1.1  --    ------------------------------------------------------------------------------------------------------------------  Cardiac Enzymes No results for input(s): TROPONINI in the last 168 hours. ------------------------------------------------------------------------------------------------------------------  RADIOLOGY:  Ct Abdomen Pelvis W Contrast  Result Date: 12/10/2017 CLINICAL DATA:  Acute generalized abdominal pain, recent kidney stone surgery on Friday, since that having lower abdominal pain worsened since Monday EXAM: CT ABDOMEN AND PELVIS WITH CONTRAST TECHNIQUE: Multidetector CT imaging of the abdomen and pelvis was performed using the standard protocol following bolus administration of intravenous contrast. Sagittal and coronal MPR images reconstructed from axial data set. CONTRAST:  ISOVUE-300 IOPAMIDOL (ISOVUE-300) INJECTION 61% IV. No oral contrast. COMPARISON:  11/09/2017 FINDINGS: Lower chest: Small RIGHT pleural effusion. Bibasilar atelectasis greater on RIGHT. Hepatobiliary: Liver and gallbladder normal appearance Pancreas: Normal appearance Spleen: Normal appearance Adrenals/Urinary Tract: Adrenal glands normal  appearance. Tiny cyst LEFT kidney without dominant mass or hydronephrosis. Large  staghorn calculi within RIGHT kidney measuring up to 3.0 cm diameter. RIGHT ureteral stent extends from renal pelvis into urinary bladder. RIGHT hydronephrosis and hydroureter. Enhancing wall of the RIGHT renal pelvis and RIGHT ureter could be related to prior surgery or urinary tract infection. Mild delay in RIGHT nephrogram. Linear defect within the cortex of the mid RIGHT kidney consistent with access site for preceding percutaneous procedure. Significant edema in the RIGHT anterior pararenal and RIGHT perinephric spaces. Focal fluid collection dependently within the RIGHT perinephric space inferior to the kidney, 6.4 x 2.9 cm in greatest axial dimensions and extending for 15.8 cm length. Delayed images demonstrate a small amount of excreted contrast material within this collection and traversing the percutaneous tract through the posterolateral mid RIGHT kidney, likely representing. Edema in the anterior RIGHT pararenal space extends adjacent to the ascending colon and duodenum though these do not appear to be the epicenter of the process. Small RIGHT renal cyst. Large stone fragment within the mid to distal RIGHT ureter, 11 x 8 mm in axial dimensions extending 19 mm length. LEFT ureter and bladder unremarkable. Stomach/Bowel: Stomach and bowel loops otherwise normal appearance Vascular/Lymphatic: Aorta normal caliber. No adenopathy. Few normal to upper normal sized retroperitoneal nodes up to 10 mm diameter. Reproductive: Unremarkable uterus and ovaries Other: Small amount of free fluid in pelvis. Scattered stranding of RIGHT pelvic tissue planes by edema. No hernia or free air. Musculoskeletal: Osseous structures unremarkable IMPRESSION: RIGHT hydronephrosis and hydroureter despite ureteral stent. Large staghorn calculi RIGHT kidney with a large stone fragment at the dilated distal LEFT ureter adjacent to the stent, 11 x 8 x 19 mm  in size. Enhancing walls of renal pelvis and ureter could be due to surgery or urinary tract infection, recommend correlation with urinalysis. Fluid collection in the dependent portion of the RIGHT pararenal space 6.4 x 2.9 x 15.8 cm with evidence of excreted contrast material within this collection through the percutaneous track from recent RIGHT nephrolithotomy consistent with urinary extravasation and urinoma. Additional edema in the RIGHT anterior pararenal space as well as small RIGHT pleural effusion and RIGHT basilar atelectasis. Electronically Signed   By: Ulyses Southward M.D.   On: 12/10/2017 17:08   Dg Chest Port 1 View  Result Date: 12/10/2017 CLINICAL DATA:  Patient with sepsis. EXAM: PORTABLE CHEST 1 VIEW COMPARISON:  Chest radiograph 12/03/2017 FINDINGS: Stable cardiac and mediastinal contours. No consolidative pulmonary opacities. No pleural effusion or pneumothorax. IMPRESSION: No acute cardiopulmonary process. Electronically Signed   By: Annia Belt M.D.   On: 12/10/2017 21:05    ASSESSMENT AND PLAN:  *Acute sepsis secondary to acute pyelonephritis Resolving Continue sepsis protocol Urology input appreciated-antibiotics changed to Bactrim, continue IV fluids for rehydration, BMP/CBC in the morning  *Acute right hydronephrosis Urology input appreciated-plan is for conservative therapy, adult pain protocol, antibiotics per above, if any worsening/no improvement-for surgery possibly in the morning CT abdomen noted for right hydronephrosis despite stent placement, staghorn stone  *Acute pyelonephritis Plan of care as stated above  All the records are reviewed and case discussed with Care Management/Social Workerr. Management plans discussed with the patient, family and they are in agreement.  CODE STATUS: full  TOTAL TIME TAKING CARE OF THIS PATIENT: 35 minutes.     POSSIBLE D/C IN 1-3 DAYS, DEPENDING ON CLINICAL CONDITION.   Evelena Asa Salary M.D on 12/11/2017   Between  7am to 6pm - Pager - [phone]  After 6pm go to [email or web address] - password [email or web address]  [facility] [place]  [facility]  Office  [phone]  CC: Primary care physician; Dorcas Carrow, DO  Note: This dictation was prepared with Dragon dictation along with smaller phrase technology. Any transcriptional errors that result from this process are unintentional.

## 2017-12-12 LAB — URINE CULTURE

## 2017-12-12 MED ORDER — TRIMETHOPRIM 100 MG PO TABS: 100.0000 mg | ORAL_TABLET | Freq: Every day | ORAL | 1 refills | Status: DC

## 2017-12-12 MED ORDER — ACETAMINOPHEN 500 MG PO TABS: 1000.0000 mg | ORAL_TABLET | Freq: Four times a day (QID) | ORAL | Status: DC | PRN

## 2017-12-12 MED ORDER — SULFAMETHOXAZOLE-TRIMETHOPRIM 800-160 MG PO TABS: 1.0000 | ORAL_TABLET | Freq: Two times a day (BID) | ORAL | 0 refills | Status: DC

## 2017-12-12 MED ORDER — TRAMADOL HCL 50 MG PO TABS: 50.0000 mg | ORAL_TABLET | Freq: Four times a day (QID) | ORAL | 0 refills | Status: DC | PRN

## 2017-12-12 MED ORDER — IBUPROFEN 200 MG PO TABS: 600.0000 mg | ORAL_TABLET | Freq: Four times a day (QID) | ORAL | 0 refills | Status: DC | PRN

## 2017-12-12 NOTE — Discharge Summary (Signed)
[facility] [place] at [facility]   PATIENT NAME: Candice Robinson    MR#:  [id number]  DATE OF BIRTH:  December 09, 1970  DATE OF ADMISSION:  12/10/2017 ADMITTING PHYSICIAN: Shaune Pollack, MD  DATE OF DISCHARGE: No discharge date for patient encounter.  PRIMARY CARE PHYSICIAN: Olevia Perches P, DO    ADMISSION DIAGNOSIS:  Staghorn calculus [N20.0] Ureteral obstruction, right [N13.5] Sepsis without acute organ dysfunction, due to unspecified organism (HCC) [A41.9]  DISCHARGE DIAGNOSIS:  Active Problems:   Sepsis (HCC)   SECONDARY DIAGNOSIS:   Past Medical History:  Diagnosis Date  . Kidney stone     HOSPITAL COURSE:  *Acute sepsis secondary to acute pyelonephritis Resolved Treated on our sepsis protocol, urology did see patient while in house-recommended treatment with Bactrim for 2-week course with subsequent change to daily prophylaxis thereafter, treated with IV fluids for rehydration, adult pain protocol, and patient did well  *Acute right hydronephrosis Stable Urology did see patient while in house-recommended conservative therapy, adult pain protocol, antibiotics per above CT abdomen noted for right hydronephrosis despite stent placement, staghorn stone  *Acute pyelonephritis Resolving Plan of care as stated above  DISCHARGE CONDITIONS:   stable  CONSULTS OBTAINED:  Treatment Team:  Crista Elliot, MD  DRUG ALLERGIES:   Allergies  Allergen Reactions  . Codeine Other (See Comments)    Hallucinations  . Gluten Meal     Stomach bloating, indigestion     DISCHARGE MEDICATIONS:   Allergies as of 12/12/2017      Reactions   Codeine Other (See Comments)   Hallucinations   Gluten Meal    Stomach bloating, indigestion       Medication List    STOP taking these medications   HYDROcodone-acetaminophen 5-325 MG tablet Commonly known as:  NORCO/VICODIN   oxybutynin 5 MG tablet Commonly known as:  DITROPAN     TAKE these  medications   acetaminophen 500 MG tablet Commonly known as:  TYLENOL Take 2 tablets (1,000 mg total) by mouth every 6 (six) hours as needed for mild pain (or Fever >/= 101).   diphenhydrAMINE 25 MG tablet Commonly known as:  BENADRYL Take 25 mg by mouth at bedtime as needed for allergies.   ibuprofen 200 MG tablet Commonly known as:  ADVIL,MOTRIN Take 3 tablets (600 mg total) by mouth every 6 (six) hours as needed for headache or moderate pain. What changed:    how much to take  when to take this  reasons to take this   PROBIOTIC DAILY PO Take 1 capsule by mouth daily.   sulfamethoxazole-trimethoprim 800-160 MG tablet Commonly known as:  BACTRIM DS,SEPTRA DS Take 1 tablet by mouth every 12 (twelve) hours for 12 days.   tamsulosin 0.4 MG Caps capsule Commonly known as:  FLOMAX Take 1 capsule (0.4 mg total) by mouth daily.   traMADol 50 MG tablet Commonly known as:  ULTRAM Take 1-2 tablets (50-100 mg total) by mouth every 6 (six) hours as needed for moderate pain.   trimethoprim 100 MG tablet Commonly known as:  TRIMPEX Take 1 tablet (100 mg total) by mouth at bedtime. Start taking the night after completing the course of Bactrim DS on 10/25        DISCHARGE INSTRUCTIONS:  If you experience worsening of your admission symptoms, develop shortness of breath, life threatening emergency, suicidal or homicidal thoughts you must seek medical attention immediately by calling 911 or calling your MD immediately  if symptoms less severe.  You Must read complete instructions/literature along with all the possible adverse reactions/side effects for all the Medicines you take and that have been prescribed to you. Take any new Medicines after you have completely understood and accept all the possible adverse reactions/side effects.   Please note  You were cared for by a hospitalist during your hospital stay. If you have any questions about your discharge medications or the care you  received while you were in the hospital after you are discharged, you can call the unit and asked to speak with the hospitalist on call if the hospitalist that took care of you is not available. Once you are discharged, your primary care physician will handle any further medical issues. Please note that NO REFILLS for any discharge medications will be authorized once you are discharged, as it is imperative that you return to your primary care physician (or establish a relationship with a primary care physician if you do not have one) for your aftercare needs so that they can reassess your need for medications and monitor your lab values.    Today   CHIEF COMPLAINT:   Chief Complaint  Patient presents with  . Abdominal Pain    HISTORY OF PRESENT ILLNESS:   47 y.o. female with a known history of kidney stone, status supposed stent placement 5 days ago.  She presents the ED with above chief complaints.  She also complains of fever and chills.  She denies any nausea or vomiting or diarrhea.  No dysuria hematuria.  She was found to sepsis with tachycardia, leukocytosis, UTI and the right side hydronephrosis.  Dr. Scotty Court discussed with urologist and request admission.  VITAL SIGNS:  Blood pressure 112/68, pulse 79, temperature 98.7 F (37.1 C), temperature source Oral, resp. rate 18, height 5\' 3"  (1.6 m), weight 77.1 kg, last menstrual period 12/06/2017, SpO2 98 %.  I/O:    Intake/Output Summary (Last 24 hours) at 12/12/2017 1037 Last data filed at 12/12/2017 0952 Gross per 24 hour  Intake 2044.58 ml  Output 631 ml  Net 1413.58 ml    PHYSICAL EXAMINATION:  GENERAL:  47 y.o.-year-old patient lying in the bed with no acute distress.  EYES: Pupils equal, round, reactive to light and accommodation. No scleral icterus. Extraocular muscles intact.  HEENT: Head atraumatic, normocephalic. Oropharynx and nasopharynx clear.  NECK:  Supple, no jugular venous distention. No thyroid enlargement,  no tenderness.  LUNGS: Normal breath sounds bilaterally, no wheezing, rales,rhonchi or crepitation. No use of accessory muscles of respiration.  CARDIOVASCULAR: S1, S2 normal. No murmurs, rubs, or gallops.  ABDOMEN: Soft, non-tender, non-distended. Bowel sounds present. No organomegaly or mass.  EXTREMITIES: No pedal edema, cyanosis, or clubbing.  NEUROLOGIC: Cranial nerves II through XII are intact. Muscle strength 5/5 in all extremities. Sensation intact. Gait not checked.  PSYCHIATRIC: The patient is alert and oriented x 3.  SKIN: No obvious rash, lesion, or ulcer.   DATA REVIEW:   CBC Recent Labs  Lab 12/11/17 0037  WBC 19.0*  HGB 9.2*  HCT 29.8*  PLT 378    Chemistries  Recent Labs  Lab 12/10/17 1424 12/11/17 0037  NA 135 137  K 3.9 3.9  CL 101 106  CO2 25 25  GLUCOSE 132* 123*  BUN 10 8  CREATININE 1.12* 0.79  CALCIUM 8.8* 8.2*  AST 33  --   ALT 38  --   ALKPHOS 105  --   BILITOT 1.1  --     Cardiac Enzymes No results for  input(s): TROPONINI in the last 168 hours.  Microbiology Results  Results for orders placed or performed during the hospital encounter of 12/10/17  Urine Culture     Status: Abnormal   Collection Time: 12/10/17  2:24 PM  Result Value Ref Range Status   Specimen Description   Final    URINE, RANDOM Performed at [facility], [address] [place], [place] [address]    Special Requests   Final    NONE Performed at [facility], [address] [place], [place] [address]    Culture (A)  Final    <10,000 COLONIES/mL INSIGNIFICANT GROWTH Performed at [facility], [address] [place], [place] [address]    Report Status 12/12/2017 FINAL  Final  Culture, blood (x 2)     Status: None (Preliminary result)   Collection Time: 12/10/17  9:20 PM  Result Value Ref Range Status   Specimen Description BLOOD LEFT HAND  Final   Special Requests   Final    BOTTLES DRAWN AEROBIC AND ANAEROBIC Blood Culture adequate  volume   Culture   Final    NO GROWTH 2 DAYS Performed at [facility], [address] [place], [place] [address]    Report Status PENDING  Incomplete  Culture, blood (x 2)     Status: None (Preliminary result)   Collection Time: 12/10/17  9:33 PM  Result Value Ref Range Status   Specimen Description BLOOD RIGHT ANTECUBITAL  Final   Special Requests   Final    BOTTLES DRAWN AEROBIC AND ANAEROBIC Blood Culture adequate volume   Culture   Final    NO GROWTH 2 DAYS Performed at [facility], [address] [place], [place] [address]    Report Status PENDING  Incomplete    RADIOLOGY:  Ct Abdomen Pelvis W Contrast  Result Date: 12/10/2017 CLINICAL DATA:  Acute generalized abdominal pain, recent kidney stone surgery on Friday, since that having lower abdominal pain worsened since Monday EXAM: CT ABDOMEN AND PELVIS WITH CONTRAST TECHNIQUE: Multidetector CT imaging of the abdomen and pelvis was performed using the standard protocol following bolus administration of intravenous contrast. Sagittal and coronal MPR images reconstructed from axial data set. CONTRAST:  ISOVUE-300 IOPAMIDOL (ISOVUE-300) INJECTION 61% IV. No oral contrast. COMPARISON:  11/09/2017 FINDINGS: Lower chest: Small RIGHT pleural effusion. Bibasilar atelectasis greater on RIGHT. Hepatobiliary: Liver and gallbladder normal appearance Pancreas: Normal appearance Spleen: Normal appearance Adrenals/Urinary Tract: Adrenal glands normal appearance. Tiny cyst LEFT kidney without dominant mass or hydronephrosis. Large staghorn calculi within RIGHT kidney measuring up to 3.0 cm diameter. RIGHT ureteral stent extends from renal pelvis into urinary bladder. RIGHT hydronephrosis and hydroureter. Enhancing wall of the RIGHT renal pelvis and RIGHT ureter could be related to prior surgery or urinary tract infection. Mild delay in RIGHT nephrogram. Linear defect within the cortex of the mid RIGHT kidney consistent with  access site for preceding percutaneous procedure. Significant edema in the RIGHT anterior pararenal and RIGHT perinephric spaces. Focal fluid collection dependently within the RIGHT perinephric space inferior to the kidney, 6.4 x 2.9 cm in greatest axial dimensions and extending for 15.8 cm length. Delayed images demonstrate a small amount of excreted contrast material within this collection and traversing the percutaneous tract through the posterolateral mid RIGHT kidney, likely representing. Edema in the anterior RIGHT pararenal space extends adjacent to the ascending colon and duodenum though these do not appear to be the epicenter of the process. Small RIGHT renal cyst.  Large stone fragment within the mid to distal RIGHT ureter, 11 x 8 mm in axial dimensions extending 19 mm length. LEFT ureter and bladder unremarkable. Stomach/Bowel: Stomach and bowel loops otherwise normal appearance Vascular/Lymphatic: Aorta normal caliber. No adenopathy. Few normal to upper normal sized retroperitoneal nodes up to 10 mm diameter. Reproductive: Unremarkable uterus and ovaries Other: Small amount of free fluid in pelvis. Scattered stranding of RIGHT pelvic tissue planes by edema. No hernia or free air. Musculoskeletal: Osseous structures unremarkable IMPRESSION: RIGHT hydronephrosis and hydroureter despite ureteral stent. Large staghorn calculi RIGHT kidney with a large stone fragment at the dilated distal LEFT ureter adjacent to the stent, 11 x 8 x 19 mm in size. Enhancing walls of renal pelvis and ureter could be due to surgery or urinary tract infection, recommend correlation with urinalysis. Fluid collection in the dependent portion of the RIGHT pararenal space 6.4 x 2.9 x 15.8 cm with evidence of excreted contrast material within this collection through the percutaneous track from recent RIGHT nephrolithotomy consistent with urinary extravasation and urinoma. Additional edema in the RIGHT anterior pararenal space as well  as small RIGHT pleural effusion and RIGHT basilar atelectasis. Electronically Signed   By: Ulyses Southward M.D.   On: 12/10/2017 17:08   Dg Chest Port 1 View  Result Date: 12/10/2017 CLINICAL DATA:  Patient with sepsis. EXAM: PORTABLE CHEST 1 VIEW COMPARISON:  Chest radiograph 12/03/2017 FINDINGS: Stable cardiac and mediastinal contours. No consolidative pulmonary opacities. No pleural effusion or pneumothorax. IMPRESSION: No acute cardiopulmonary process. Electronically Signed   By: Annia Belt M.D.   On: 12/10/2017 21:05    EKG:  No orders found for this or any previous visit.    Management plans discussed with the patient, family and they are in agreement.  CODE STATUS:     Code Status Orders  (From admission, onward)         Start     Ordered   12/10/17 2018  Full code  Continuous     12/10/17 2017        Code Status History    Date Active Date Inactive Code Status Order ID Comments User Context   12/03/2017 1612 12/05/2017 1847 Full Code [id number]  Sondra Come, MD Inpatient      TOTAL TIME TAKING CARE OF THIS PATIENT: 40 minutes.    Evelena Asa Salary M.D on 12/12/2017 at 10:37 AM  Between 7am to 6pm - Pager - [phone]  After 6pm go to [email or web address] - password [email or web address]  [facility] [place] [facility]  Office  [phone]  CC: Primary care physician; Dorcas Carrow, DO   Note: This dictation was prepared with Dragon dictation along with smaller phrase technology. Any transcriptional errors that result from this process are unintentional.

## 2017-12-12 NOTE — Progress Notes (Signed)
I am seeing this patient in following her for fever, recent right-sided kidney stone procedure with stent placement and admission CT scan showing persistent right-sided hydronephrosis.  Interval: The patient was transitioned to Bactrim from IV antibiotics yesterday, and has been afebrile and is feeling better. Her catheter was removed, she has had low PVRs.  Scheduled Meds: . acidophilus  1 capsule Oral Daily  . enoxaparin (LOVENOX) injection  40 mg Subcutaneous Q24H  . ketorolac  30 mg Intravenous Q6H  . sulfamethoxazole-trimethoprim  1 tablet Oral Q12H  . tamsulosin  0.4 mg Oral Daily   Continuous Infusions:  PRN Meds:.acetaminophen **OR** acetaminophen, albuterol, alum & mag hydroxide-simeth, bisacodyl, diphenhydrAMINE, ondansetron **OR** ondansetron (ZOFRAN) IV, oxybutynin, promethazine, senna-docusate, traMADol Past Medical History:  Diagnosis Date  . Kidney stone    PE: NAD Vitals:   12/11/17 0447 12/11/17 1121 12/11/17 2026 12/12/17 0554  BP: 102/60 110/64 116/72 112/68  Pulse: 72 87 84 79  Resp: 16  20 18   Temp: 97.8 F (36.6 C) 98.1 F (36.7 C) 98.6 F (37 C) 98.7 F (37.1 C)  TempSrc: Oral Oral Oral Oral  SpO2: 100% 97% 97% 98%  Weight:      Height:        Intake/Output Summary (Last 24 hours) at 12/12/2017 0809 Last data filed at 12/12/2017 0119 Gross per 24 hour  Intake 1804.58 ml  Output 806 ml  Net 998.58 ml   Nonlabored breathing Abdomen is soft, slight tenderness to palpation in the right lower quadrant Lower extremities are symmetric without edema   Recent Labs    12/10/17 1331 12/10/17 1424 12/11/17 0037  WBC 17.0* 19.2* 19.0*  HGB 10.7* 10.4* 9.2*  HCT 33.0* 33.5* 29.8*   Recent Labs    12/10/17 1424 12/11/17 0037  NA 135 137  K 3.9 3.9  CL 101 106  CO2 25 25  GLUCOSE 132* 123*  BUN 10 8  CREATININE 1.12* 0.79  CALCIUM 8.8* 8.2*   Recent Labs    12/10/17 2120  INR 1.36   No results for input(s): PSA in the last 72  hours. No results for input(s): LABURIN in the last 72 hours. Results for orders placed or performed during the hospital encounter of 12/10/17  Culture, blood (x 2)     Status: None (Preliminary result)   Collection Time: 12/10/17  9:20 PM  Result Value Ref Range Status   Specimen Description BLOOD LEFT HAND  Final   Special Requests   Final    BOTTLES DRAWN AEROBIC AND ANAEROBIC Blood Culture adequate volume   Culture   Final    NO GROWTH < 12 HOURS Performed at [facility], [address], [place], [place] [address]    Report Status PENDING  Incomplete  Culture, blood (x 2)     Status: None (Preliminary result)   Collection Time: 12/10/17  9:33 PM  Result Value Ref Range Status   Specimen Description BLOOD RIGHT ANTECUBITAL  Final   Special Requests   Final    BOTTLES DRAWN AEROBIC AND ANAEROBIC Blood Culture adequate volume   Culture   Final    NO GROWTH < 12 HOURS Performed at [facility], [address] [place], [place] [address]    Report Status PENDING  Incomplete    Imaging: I reviewed the patient's CT scan on admission and the other 2 CT scans that she had in September.  On 11/02/2017 she has hydronephrosis down to the bladder with some thickening of the urothelium.  On her CT scan from this admission she has persistent hydronephrosis and a stone in the distal ureter.  Impression: Right-sided pyelonephritis, chronic hydroureteronephrosis, right staghorn calculi-the patient appears to be improving on oral antibiotics.  Recommendations: I recommended that the patient be discharged home today on Bactrim double strength for a total of 14 days of treatment.  She will then start trimethoprim suppression.  I have written these prescriptions.  I also gave the patient a prescription for tramadol to help augment her pain.  She also was recommended to take Tylenol and ibuprofen alternately every 6 hours for pain.  We will have the patient follow-up soon with Dr.  Gabrielle Dare, MD

## 2017-12-13 ENCOUNTER — Telehealth: Payer: Self-pay

## 2017-12-13 ENCOUNTER — Telehealth: Payer: Self-pay | Admitting: Urology

## 2017-12-13 NOTE — Telephone Encounter (Signed)
I have made the 2nd attempt to contact the patient or family member in charge, in order to follow up from recently being discharged from the hospital.  

## 2017-12-13 NOTE — Telephone Encounter (Signed)
App made per Dr. Herrick ° ° °Michelle °

## 2017-12-13 NOTE — Telephone Encounter (Signed)
I have made the 1st attempt to contact the patient or family member in charge, in order to follow up from recently being discharged from the hospital. I was unable to leave a message on her voicemail but I will make another attempt at a different time.   Direct call back [phone]

## 2017-12-13 NOTE — Telephone Encounter (Signed)
-----   Message from Crist Fat, MD sent at 12/12/2017  8:07 AM EDT ----- Regarding: f/u Team-  She was admitted this weekend and we discussed following up soon to discuss treatment options again.  She'll be calling Monday AM... Would probably be good for her to see Dr. Gabrielle Dare at some point next week if available. BenH

## 2017-12-13 NOTE — Telephone Encounter (Signed)
Pt called office this morning, states she had surgery 10/4, was in ER for infection this past Friday 10/11, pt asking to speak with Dr. Richardo Hanks as she would like to move her next surgery date to a sooner date.  Spoke with Dr. Richardo Hanks who offered to [facility] pt to talk to her or offer that he would call her after clinic hours. Pt opted to have Dr. Aleene Davidson call her this evening, pt advised it will be after clinic hours. Pt voiced understand. Please reach pt at [phone]. Thanks.

## 2017-12-14 LAB — HIV ANTIBODY (ROUTINE TESTING W REFLEX): HIV SCREEN 4TH GENERATION: NONREACTIVE

## 2017-12-14 NOTE — Patient Instructions (Signed)
Follow up after ER discharge 

## 2017-12-15 ENCOUNTER — Encounter: Payer: Self-pay | Admitting: Radiology

## 2017-12-15 ENCOUNTER — Other Ambulatory Visit: Payer: Self-pay | Admitting: Radiology

## 2017-12-15 ENCOUNTER — Ambulatory Visit (INDEPENDENT_AMBULATORY_CARE_PROVIDER_SITE_OTHER): Payer: Commercial Managed Care - PPO | Admitting: Urology

## 2017-12-15 ENCOUNTER — Encounter: Payer: Self-pay | Admitting: Urology

## 2017-12-15 VITALS — BP 131/73 | HR 100 | Ht 63.0 in | Wt 170.0 lb

## 2017-12-15 DIAGNOSIS — N200 Calculus of kidney: Secondary | ICD-10-CM | POA: Diagnosis not present

## 2017-12-15 LAB — CULTURE, BLOOD (ROUTINE X 2)
CULTURE: NO GROWTH
CULTURE: NO GROWTH
SPECIAL REQUESTS: ADEQUATE
SPECIAL REQUESTS: ADEQUATE

## 2017-12-15 MED ORDER — NITROFURANTOIN MONOHYD MACRO 100 MG PO CAPS: 100.0000 mg | ORAL_CAPSULE | Freq: Every day | ORAL | 0 refills | Status: DC

## 2017-12-15 NOTE — Progress Notes (Signed)
   12/15/2017 12:25 PM   Kisha Malachy Chamber 1971/02/09 [id number]  Reason for visit: Follow up right PCNL  HPI: I saw Ms. Klinger back in urology clinic in follow-up after a right PCNL 12/03/2017.  She is a 47 year old female who initially presented with a complete right staghorn stone.  She was discharged on postop day 2 with just a right ureteral stent in place, and plan for follow-up ureteroscopy for her residual stone burden.  She was then readmitted to the hospital on 12/10/2017 with low-grade fever and malaise.  CT scan showed residual stone burden as expected, mild hydroureteronephrosis with a distended bladder, and stent in appropriate position.  Chest x-ray was normal.  Urine culture grew less than 10,000 colonies consistent with insignificant growth, and blood cultures were negative.  She was admitted and started on a two-week course of Bactrim for presumed pyelonephritis.  She continues to feel unwell today, with primarily general malaise and fatigue.  She notes new nausea and vomiting since starting the Bactrim, as well as persistent right-sided flank discomfort and abdominal pain.  She is moving her bowels normally.  She denies any fevers.  Denies gross hematuria   ROS: Please see flowsheet from today's date for complete review of systems.  Physical Exam: BP 131/73   Pulse 100   Ht 5\' 3"  (1.6 m)   Wt 169 lb 15.6 oz (77.1 kg)   LMP 12/06/2017   BMI 30.11 kg/m    Constitutional:  Alert and oriented, No acute distress. Respiratory: Normal respiratory effort, no increased work of breathing. GI: Abdomen is soft, non-tender, nondistended, no abdominal masses GU: Right CVA tenderness, well-healed right PCNL incision without erythema or edema Skin: No rashes, bruises or suspicious lesions. Neurologic: Grossly intact, no focal deficits, moving all 4 extremities. Psychiatric: Normal mood and affect   Pertinent Imaging: I have personally reviewed the CT abdomen pelvis with contrast  and the chest x-ray dated 12/10/2016, as discussed in HPI  Assessment & Plan:   In summary, Ms. Ronni Rumble is a 47 year old female status post right PCNL 12/03/2017 with residual stone burden.  She is continued to have flank pain and likely stent related symptoms since that time, as well as new nausea and vomiting since starting Bactrim after being admitted 10/11 with possible pyelonephritis.  Cultures were ultimately negative.  -Discontinue Bactrim secondary to likely GI side effects -Start nitrofurantoin 100 mg daily prophylaxis while stent in place -Continue Flomax for stent symptoms, she is not having any bladder spasms so we will hold off on anticholinergic -Continue Tylenol and ibuprofen for pain, she does not want to take narcotics -Right ureteroscopy, laser lithotripsy and stent exchange scheduled for 01/07/2018.  We discussed she may need an additional ureteroscopy after this to totally clear her stone burden.  I discussed alternatives including repeat PCNL, however she is very reluctant to undergo repeat percutaneous surgery since she has had so much discomfort and pain after her initial PCNL. -Discussed return precautions to ED including fever over 101.3, significant hematuria, or ongoing abdominal pain -I will call her Friday afternoon to check on her symptoms  Sondra Come, MD  [facility] [address], [address] [place], [place] [address] [phone]

## 2017-12-16 ENCOUNTER — Other Ambulatory Visit: Payer: Self-pay

## 2017-12-16 ENCOUNTER — Other Ambulatory Visit: Payer: Commercial Managed Care - PPO

## 2017-12-16 DIAGNOSIS — N200 Calculus of kidney: Secondary | ICD-10-CM

## 2017-12-17 ENCOUNTER — Other Ambulatory Visit: Payer: Self-pay | Admitting: Radiology

## 2017-12-17 ENCOUNTER — Other Ambulatory Visit: Payer: Commercial Managed Care - PPO

## 2017-12-17 DIAGNOSIS — N200 Calculus of kidney: Secondary | ICD-10-CM

## 2017-12-17 LAB — URINALYSIS, COMPLETE
BILIRUBIN UA: NEGATIVE
Glucose, UA: NEGATIVE
Ketones, UA: NEGATIVE
Nitrite, UA: NEGATIVE
PH UA: 7 (ref 5.0–7.5)
Specific Gravity, UA: 1.02 (ref 1.005–1.030)
UUROB: 0.2 mg/dL (ref 0.2–1.0)

## 2017-12-17 LAB — MICROSCOPIC EXAMINATION: RBC, UA: NONE SEEN /hpf (ref 0–2)

## 2017-12-20 ENCOUNTER — Other Ambulatory Visit: Payer: Commercial Managed Care - PPO

## 2017-12-20 ENCOUNTER — Other Ambulatory Visit: Payer: Self-pay | Admitting: Radiology

## 2017-12-20 DIAGNOSIS — N200 Calculus of kidney: Secondary | ICD-10-CM

## 2017-12-20 LAB — CULTURE, URINE COMPREHENSIVE

## 2017-12-21 ENCOUNTER — Encounter: Payer: Self-pay | Admitting: Urology

## 2017-12-23 LAB — CULTURE, URINE COMPREHENSIVE

## 2017-12-27 ENCOUNTER — Ambulatory Visit
Admission: RE | Admit: 2017-12-27 | Discharge: 2017-12-27 | Disposition: A | Discharge status: Home / Self Care | Payer: Commercial Managed Care - PPO | Source: Ambulatory Visit | Attending: Urology | Admitting: Urology

## 2017-12-27 ENCOUNTER — Ambulatory Visit: Payer: Commercial Managed Care - PPO | Admitting: Anesthesiology

## 2017-12-27 ENCOUNTER — Encounter
Admission: RE | Disposition: A | Discharge status: Home / Self Care | Payer: Self-pay | Source: Ambulatory Visit | Attending: Urology

## 2017-12-27 ENCOUNTER — Other Ambulatory Visit: Payer: Self-pay

## 2017-12-27 ENCOUNTER — Encounter: Payer: Self-pay | Admitting: *Deleted

## 2017-12-27 DIAGNOSIS — Z882 Allergy status to sulfonamides status: Secondary | ICD-10-CM | POA: Insufficient documentation

## 2017-12-27 DIAGNOSIS — Z885 Allergy status to narcotic agent status: Secondary | ICD-10-CM | POA: Diagnosis not present

## 2017-12-27 DIAGNOSIS — N200 Calculus of kidney: Secondary | ICD-10-CM

## 2017-12-27 DIAGNOSIS — Z79899 Other long term (current) drug therapy: Secondary | ICD-10-CM | POA: Insufficient documentation

## 2017-12-27 DIAGNOSIS — Z9889 Other specified postprocedural states: Secondary | ICD-10-CM

## 2017-12-27 DIAGNOSIS — R112 Nausea with vomiting, unspecified: Secondary | ICD-10-CM

## 2017-12-27 HISTORY — DX: Adverse effect of unspecified anesthetic, initial encounter: T41.45XA

## 2017-12-27 HISTORY — DX: Other specified postprocedural states: R11.2

## 2017-12-27 HISTORY — PX: CYSTOSCOPY/URETEROSCOPY/HOLMIUM LASER/STENT PLACEMENT: SHX6546

## 2017-12-27 HISTORY — DX: Other complications of anesthesia, initial encounter: T88.59XA

## 2017-12-27 HISTORY — DX: Other specified postprocedural states: Z98.890

## 2017-12-27 LAB — POCT PREGNANCY, URINE: PREG TEST UR: NEGATIVE

## 2017-12-27 SURGERY — CYSTOSCOPY/URETEROSCOPY/HOLMIUM LASER/STENT PLACEMENT
Anesthesia: Choice | Laterality: Right

## 2017-12-27 MED ORDER — PROMETHAZINE HCL 25 MG/ML IJ SOLN
INTRAMUSCULAR | Status: AC
  Administered 2017-12-27: 6.25 mg via INTRAVENOUS
  Filled 2017-12-27: qty 1

## 2017-12-27 MED ORDER — FENTANYL CITRATE (PF) 100 MCG/2ML IJ SOLN
INTRAMUSCULAR | Status: AC
  Filled 2017-12-27: qty 2

## 2017-12-27 MED ORDER — FENTANYL CITRATE (PF) 100 MCG/2ML IJ SOLN
INTRAMUSCULAR | Status: DC | PRN
  Administered 2017-12-27 (×2): 50 ug via INTRAVENOUS

## 2017-12-27 MED ORDER — MIDAZOLAM HCL 2 MG/2ML IJ SOLN
INTRAMUSCULAR | Status: DC | PRN
  Administered 2017-12-27: 2 mg via INTRAVENOUS

## 2017-12-27 MED ORDER — ONDANSETRON HCL 4 MG/2ML IJ SOLN
INTRAMUSCULAR | Status: AC
  Filled 2017-12-27: qty 2

## 2017-12-27 MED ORDER — DEXAMETHASONE SODIUM PHOSPHATE 10 MG/ML IJ SOLN
INTRAMUSCULAR | Status: DC | PRN
  Administered 2017-12-27: 10 mg via INTRAVENOUS

## 2017-12-27 MED ORDER — TAMSULOSIN HCL 0.4 MG PO CAPS: 0.4000 mg | ORAL_CAPSULE | Freq: Every day | ORAL | 1 refills | Status: DC

## 2017-12-27 MED ORDER — PROPOFOL 10 MG/ML IV BOLUS
INTRAVENOUS | Status: DC | PRN
  Administered 2017-12-27: 150 mg via INTRAVENOUS

## 2017-12-27 MED ORDER — SODIUM CHLORIDE FLUSH 0.9 % IV SOLN
INTRAVENOUS | Status: AC
  Filled 2017-12-27: qty 10

## 2017-12-27 MED ORDER — FAMOTIDINE 20 MG PO TABS
ORAL_TABLET | ORAL | Status: AC
  Filled 2017-12-27: qty 1

## 2017-12-27 MED ORDER — LACTATED RINGERS IV SOLN
INTRAVENOUS | Status: DC
  Administered 2017-12-27: 16:00:00 via INTRAVENOUS
  Administered 2017-12-27: 50 mL/h via INTRAVENOUS

## 2017-12-27 MED ORDER — FAMOTIDINE 20 MG PO TABS
20.0000 mg | ORAL_TABLET | Freq: Once | ORAL | Status: AC
  Administered 2017-12-27: 20 mg via ORAL

## 2017-12-27 MED ORDER — LIDOCAINE HCL (CARDIAC) PF 100 MG/5ML IV SOSY
PREFILLED_SYRINGE | INTRAVENOUS | Status: DC | PRN
  Administered 2017-12-27: 100 mg via INTRAVENOUS

## 2017-12-27 MED ORDER — CIPROFLOXACIN IN D5W 400 MG/200ML IV SOLN
400.0000 mg | INTRAVENOUS | Status: AC
  Administered 2017-12-27: 400 mg via INTRAVENOUS

## 2017-12-27 MED ORDER — ACETAMINOPHEN 10 MG/ML IV SOLN
INTRAVENOUS | Status: DC | PRN
  Administered 2017-12-27: 1000 mg via INTRAVENOUS

## 2017-12-27 MED ORDER — ONDANSETRON HCL 4 MG/2ML IJ SOLN
INTRAMUSCULAR | Status: DC | PRN
  Administered 2017-12-27: 4 mg via INTRAVENOUS

## 2017-12-27 MED ORDER — ROCURONIUM BROMIDE 100 MG/10ML IV SOLN
INTRAVENOUS | Status: DC | PRN
  Administered 2017-12-27: 5 mg via INTRAVENOUS
  Administered 2017-12-27: 15 mg via INTRAVENOUS
  Administered 2017-12-27: 20 mg via INTRAVENOUS
  Administered 2017-12-27: 30 mg via INTRAVENOUS
  Administered 2017-12-27: 20 mg via INTRAVENOUS

## 2017-12-27 MED ORDER — OXYBUTYNIN CHLORIDE ER 10 MG PO TB24: 10.0000 mg | ORAL_TABLET | Freq: Every day | ORAL | 1 refills | Status: AC

## 2017-12-27 MED ORDER — SUGAMMADEX SODIUM 200 MG/2ML IV SOLN
INTRAVENOUS | Status: AC
  Filled 2017-12-27: qty 2

## 2017-12-27 MED ORDER — NITROFURANTOIN MACROCRYSTAL 100 MG PO CAPS: 100.0000 mg | ORAL_CAPSULE | Freq: Four times a day (QID) | ORAL | 1 refills | Status: AC

## 2017-12-27 MED ORDER — KETOROLAC TROMETHAMINE 30 MG/ML IJ SOLN
INTRAMUSCULAR | Status: DC | PRN
  Administered 2017-12-27: 15 mg via INTRAVENOUS

## 2017-12-27 MED ORDER — HYDROCODONE-ACETAMINOPHEN 5-325 MG PO TABS: 1.0000 | ORAL_TABLET | ORAL | 0 refills | Status: AC | PRN

## 2017-12-27 MED ORDER — KETOROLAC TROMETHAMINE 30 MG/ML IJ SOLN
INTRAMUSCULAR | Status: AC
  Filled 2017-12-27: qty 1

## 2017-12-27 MED ORDER — SUCCINYLCHOLINE CHLORIDE 20 MG/ML IJ SOLN
INTRAMUSCULAR | Status: DC | PRN
  Administered 2017-12-27: 100 mg via INTRAVENOUS

## 2017-12-27 MED ORDER — SUGAMMADEX SODIUM 200 MG/2ML IV SOLN
INTRAVENOUS | Status: DC | PRN
  Administered 2017-12-27: 200 mg via INTRAVENOUS

## 2017-12-27 MED ORDER — ROCURONIUM BROMIDE 50 MG/5ML IV SOLN
INTRAVENOUS | Status: AC
  Filled 2017-12-27: qty 1

## 2017-12-27 MED ORDER — CIPROFLOXACIN IN D5W 400 MG/200ML IV SOLN
INTRAVENOUS | Status: AC
  Filled 2017-12-27: qty 200

## 2017-12-27 MED ORDER — ACETAMINOPHEN 10 MG/ML IV SOLN
INTRAVENOUS | Status: AC
  Filled 2017-12-27: qty 100

## 2017-12-27 MED ORDER — MIDAZOLAM HCL 2 MG/2ML IJ SOLN
INTRAMUSCULAR | Status: AC
  Filled 2017-12-27: qty 2

## 2017-12-27 MED ORDER — PROMETHAZINE HCL 25 MG/ML IJ SOLN
6.2500 mg | INTRAMUSCULAR | Status: AC | PRN
  Administered 2017-12-27 (×2): 6.25 mg via INTRAVENOUS

## 2017-12-27 MED ORDER — FENTANYL CITRATE (PF) 100 MCG/2ML IJ SOLN: 25.0000 ug | INTRAMUSCULAR | Status: DC | PRN

## 2017-12-27 SURGICAL SUPPLY — 31 items
BAG DRAIN CYSTO-URO LG1000N (MISCELLANEOUS) ×3 IMPLANT
BRUSH SCRUB EZ 1% IODOPHOR (MISCELLANEOUS) ×3 IMPLANT
BULB IRRIG PATHFIND (MISCELLANEOUS) IMPLANT
CATH URETL 5X70 OPEN END (CATHETERS) IMPLANT
CNTNR SPEC 2.5X3XGRAD LEK (MISCELLANEOUS)
CONT SPEC 4OZ STER OR WHT (MISCELLANEOUS)
CONTAINER SPEC 2.5X3XGRAD LEK (MISCELLANEOUS) IMPLANT
DRAPE UTILITY 15X26 TOWEL STRL (DRAPES) ×3 IMPLANT
FIBER LASER LITHO 273 (Laser) ×3 IMPLANT
GLOVE BIOGEL PI IND STRL 7.5 (GLOVE) ×2 IMPLANT
GLOVE BIOGEL PI INDICATOR 7.5 (GLOVE) ×4
GOWN STRL REUS W/ TWL LRG LVL3 (GOWN DISPOSABLE) ×1 IMPLANT
GOWN STRL REUS W/ TWL XL LVL3 (GOWN DISPOSABLE) ×1 IMPLANT
GOWN STRL REUS W/TWL LRG LVL3 (GOWN DISPOSABLE) ×2
GOWN STRL REUS W/TWL XL LVL3 (GOWN DISPOSABLE) ×2
INTRODUCER DILATOR DOUBLE (INTRODUCER) IMPLANT
KIT TURNOVER CYSTO (KITS) ×3 IMPLANT
PACK CYSTO AR (MISCELLANEOUS) ×3 IMPLANT
SENSORWIRE 0.038 NOT ANGLED (WIRE) ×3
SET CYSTO W/LG BORE CLAMP LF (SET/KITS/TRAYS/PACK) ×3 IMPLANT
SHEATH URETERAL 12FRX35CM (MISCELLANEOUS) ×3 IMPLANT
SOL .9 NS 3000ML IRR  AL (IV SOLUTION) ×4
SOL .9 NS 3000ML IRR UROMATIC (IV SOLUTION) ×2 IMPLANT
STENT URET 6FRX24 CONTOUR (STENTS) IMPLANT
STENT URET 6FRX26 CONTOUR (STENTS) IMPLANT
SURGILUBE 2OZ TUBE FLIPTOP (MISCELLANEOUS) ×3 IMPLANT
SYR 10ML LL (SYRINGE) ×3 IMPLANT
TUBING ART PRESS 48 MALE/FEM (TUBING) IMPLANT
VALVE UROSEAL ADJ ENDO (VALVE) ×3 IMPLANT
WATER STERILE IRR 1000ML POUR (IV SOLUTION) ×3 IMPLANT
WIRE SENSOR 0.038 NOT ANGLED (WIRE) ×1 IMPLANT

## 2017-12-27 NOTE — Transfer of Care (Signed)
Immediate Anesthesia Transfer of Care Note  Patient: Candice Robinson  Procedure(s) Performed: CYSTOSCOPY/URETEROSCOPY/HOLMIUM LASER/STENT Exchange (Right )  Patient Location: PACU  Anesthesia Type:General  Level of Consciousness: drowsy and patient cooperative  Airway & Oxygen Therapy: Patient Spontanous Breathing and Patient connected to face mask oxygen  Post-op Assessment: Report given to RN and Post -op Vital signs reviewed and stable  Post vital signs: Reviewed and stable  Last Vitals:  Vitals Value Taken Time  BP 110/73 12/27/2017  4:00 PM  Temp    Pulse 66 12/27/2017  4:01 PM  Resp 14 12/27/2017  4:01 PM  SpO2 98 % 12/27/2017  4:01 PM  Vitals shown include unvalidated device data.  Last Pain:  Vitals:   12/27/17 1207  TempSrc: Tympanic  PainSc: 2       Patients Stated Pain Goal: 1 (12/27/17 1207)  Complications: No apparent anesthesia complications

## 2017-12-27 NOTE — Discharge Instructions (Signed)
Cystoscopy, Care After °Refer to this sheet in the next few weeks. These instructions provide you with information about caring for yourself after your procedure. Your health care provider may also give you more specific instructions. Your treatment has been planned according to current medical practices, but problems sometimes occur. Call your health care provider if you have any problems or questions after your procedure. °What can I expect after the procedure? °After the procedure, it is common to have: °· Mild pain when you urinate. Pain should stop within a few minutes after you urinate. This may last for up to 1 week. °· A small amount of blood in your urine for several days. °· Feeling like you need to urinate but producing only a small amount of urine. ° °Follow these instructions at home: ° °Medicines °· Take over-the-counter and prescription medicines only as told by your health care provider. °· If you were prescribed an antibiotic medicine, take it as told by your health care provider. Do not stop taking the antibiotic even if you start to feel better. °General instructions ° °· Return to your normal activities as told by your health care provider. Ask your health care provider what activities are safe for you. °· Do not drive for 24 hours if you received a sedative. °· Watch for any blood in your urine. If the amount of blood in your urine increases, call your health care provider. °· Follow instructions from your health care provider about eating or drinking restrictions. °· If a tissue sample was removed for testing (biopsy) during your procedure, it is your responsibility to get your test results. Ask your health care provider or the department performing the test when your results will be ready. °· Drink enough fluid to keep your urine clear or pale yellow. °· Keep all follow-up visits as told by your health care provider. This is important. °Contact a health care provider if: °· You have pain that  gets worse or does not get better with medicine, especially pain when you urinate. °· You have difficulty urinating. °Get help right away if: °· You have more blood in your urine. °· You have blood clots in your urine. °· You have abdominal pain. °· You have a fever or chills. °· You are unable to urinate. °This information is not intended to replace advice given to you by your health care provider. Make sure you discuss any questions you have with your health care provider. °Document Released: 09/05/2004 Document Revised: 07/25/2015 Document Reviewed: 01/03/2015 °Elsevier Interactive Patient Education © 2018 [employer] ° ° ° °Ureteroscopy, Care After °This sheet gives you information about how to care for yourself after your procedure. Your health care provider may also give you more specific instructions. If you have problems or questions, contact your health care provider. °What can I expect after the procedure? °After the procedure, it is common to have: °· A burning sensation when you urinate. °· Blood in your urine. °· Mild discomfort in the bladder area or kidney area when urinating. °· Needing to urinate more often or urgently. ° °Follow these instructions at home: °Medicines °· Take over-the-counter and prescription medicines only as told by your health care provider. °· If you were prescribed an antibiotic medicine, take it as told by your health care provider. Do not stop taking the antibiotic even if you start to feel better. °General instructions ° °· Do not drive for 24 hours if you were given a medicine to help you relax (sedative) during   your procedure. °· To relieve burning, try taking a warm bath or holding a warm washcloth over your groin. °· Drink enough fluid to keep your urine clear or pale yellow. °? Drink two 8-ounce glasses of water every hour for the first 2 hours after you get home. °? Continue to drink water often at home. °· You can eat what you usually do. °· Keep all follow-up  visits as told by your health care provider. This is important. °? If you had a tube placed to keep urine flowing (ureteral stent), ask your health care provider when you need to return to have it removed. °Contact a health care provider if: °· You have chills or a fever. °· You have burning pain for longer than 24 hours after the procedure. °· You have blood in your urine for longer than 24 hours after the procedure. °Get help right away if: °· You have large amounts of blood in your urine. °· You have blood clots in your urine. °· You have very bad pain. °· You have chest pain or trouble breathing. °· You are unable to urinate and you have the feeling of a full bladder. °This information is not intended to replace advice given to you by your health care provider. Make sure you discuss any questions you have with your health care provider. °Document Released: 02/21/2013 Document Revised: 12/03/2015 Document Reviewed: 11/29/2015 °Elsevier Interactive Patient Education © 2018 [employer] ° °AMBULATORY SURGERY  °DISCHARGE INSTRUCTIONS ° ° °1) The drugs that you were given will stay in your system until tomorrow so for the next 24 hours you should not: ° °A) Drive an automobile °B) Make any legal decisions °C) Drink any alcoholic beverage ° ° °2) You may resume regular meals tomorrow.  Today it is better to start with liquids and gradually work up to solid foods. ° °You may eat anything you prefer, but it is better to start with liquids, then soup and crackers, and gradually work up to solid foods. ° ° °3) Please notify your doctor immediately if you have any unusual bleeding, trouble breathing, redness and pain at the surgery site, drainage, fever, or pain not relieved by medication. ° ° ° °4) Additional Instructions: ° ° ° ° ° ° ° °Please contact your physician with any problems or Same Day Surgery at [phone], Monday through Friday 6 am to 4 pm, or [place] [facility] [place] Main number at [phone]. ° °

## 2017-12-27 NOTE — Anesthesia Postprocedure Evaluation (Addendum)
Anesthesia Post Note  Patient: Candice Robinson  Procedure(s) Performed: CYSTOSCOPY/URETEROSCOPY/HOLMIUM LASER/STENT Exchange (Right )  Patient location during evaluation: PACU Anesthesia Type: General Level of consciousness: awake and alert Pain management: pain level controlled Vital Signs Assessment: post-procedure vital signs reviewed and stable Respiratory status: spontaneous breathing, nonlabored ventilation, respiratory function stable and patient connected to nasal cannula oxygen Cardiovascular status: blood pressure returned to baseline and stable Postop Assessment: no apparent nausea or vomiting Anesthetic complications: no Comments: Had an episode of vomiting on arrival to PACU.  Nauseous in PACU.  Eventually resolved with Phenergan.  Next anesthetic would recommend triple PONV prophylaxis  vs full TIVA.     Last Vitals:  Vitals:   12/27/17 1700 12/27/17 1710  BP:  119/73  Pulse:  65  Resp:  16  Temp: (!) 36.1 C (!) 36.2 C  SpO2:  100%    Last Pain:  Vitals:   12/27/17 1710  TempSrc: Temporal  PainSc: 0-No pain                 Jovita Gamma

## 2017-12-27 NOTE — Anesthesia Preprocedure Evaluation (Addendum)
Anesthesia Evaluation    History of Anesthesia Complications (+) PONV and history of anesthetic complications  Airway Mallampati: II       Dental   Pulmonary           Cardiovascular      Neuro/Psych    GI/Hepatic   Endo/Other    Renal/GU Renal disease (stones)     Musculoskeletal   Abdominal   Peds  Hematology   Anesthesia Other Findings   Reproductive/Obstetrics                            Anesthesia Physical Anesthesia Plan  ASA: II  Anesthesia Plan:    Post-op Pain Management:    Induction:   PONV Risk Score and Plan:   Airway Management Planned:   Additional Equipment:   Intra-op Plan:   Post-operative Plan:   Informed Consent:   Plan Discussed with:   Anesthesia Plan Comments:         Anesthesia Quick Evaluation

## 2017-12-27 NOTE — Anesthesia Post-op Follow-up Note (Signed)
Anesthesia QCDR form completed.        

## 2017-12-27 NOTE — H&P (Signed)
UROLOGY H&P UPDATE  Agree with prior H&P dated 12/15/2017.  47 year old female with right staghorn stone status post right PCNL 12/03/2017.  Secondary to renal anatomy, were unable to access the entirety of stone during that procedure.  Here for follow-up right ureteroscopy to try to treat residual stone.  Cardiac: RRR Lungs: CTA bilaterally  Laterality: RIGHT Procedure: RIGHT URETEROSCOPY/LASER LITHOTRIPSY/STENT EXCHANGE  Urinalysis: Urine cx 10/21 no significant growth  Informed consent obtained, we specifically discussed the risk of bleeding, infection, sepsis, possible need for additional procedures, ureteral injury, and stent related symptoms.  We specifically discussed that typically we would repeat a PCNL to treat her residual stone burden, however secondary to the amount of pain she had during her nephrostomy tube placement and perioperatively around PCNL, she would like to attempt management of the stones with retrograde ureteroscopy.  I did counsel her extensively that pending today's procedure, she will likely require at least 1 more retrograde ureteroscopy, if not a repeat percutaneous nephrolithotomy for any residual stone burden.  Sondra Come, MD 12/27/2017

## 2017-12-27 NOTE — Op Note (Signed)
Date of procedure: 12/27/17  Preoperative diagnosis:  1. Right partial staghorn stone  Postoperative diagnosis:  1. Same  Procedure: 1. Right ureteroscopy, laser lithotripsy, stent exchange 2. Right retrograde pyelogram with intraoperative interpretation  Surgeon: Legrand Rams, MD  Anesthesia: General  Complications: None  Intraoperative findings:  1.  Significant residual mid and lower pole stone burden, greater than 4 cm total 2.  All residual stone dusted to fragments less than 2 mm 3.  Retrograde pyelogram with no residual filling defects 4.  Uncomplicated right ureteral stent placement  EBL: Minimal  Specimens: None  Drains: Right 6 French by 24 cm stent  Indication: Candice Robinson is a 47 y.o. patient that initially presented with a full right staghorn stone.  She underwent a right PCNL on 12/03/2017, however secondary to acute angles and small calyceal openings, we were unable to treat the entirety of her stone.  She declined repeat PCNL, and elected for staged ureteroscopy to manage her residual stone burden.  After reviewing the management options for treatment, they elected to proceed with the above surgical procedure(s). We have discussed the potential benefits and risks of the procedure, side effects of the proposed treatment, the likelihood of the patient achieving the goals of the procedure, and any potential problems that might occur during the procedure or recuperation. Informed consent has been obtained.  Description of procedure:  The patient was taken to the operating room and general anesthesia was induced.  The patient was placed in the dorsal lithotomy position, prepped and draped in the usual sterile fashion, and preoperative antibiotics were administered. A preoperative time-out was performed.   The 21 French rigid cystoscope was used to enter the bladder.  The indwelling right ureteral stent was grasped with flexible graspers and pulled to the meatus.  A  0.035 sensor wire was used to intubate the stent and advanced up to the renal pelvis under fluoroscopic vision.  The old stent was removed.  The semirigid ureteroscope was used to intubate the distal ureter and there was some residual stone fragments noted.  These were dusted using the 273 m laser fiber on settings of 0.5 Hz and 20 J.  We advanced a semirigid ureteroscope up to the proximal ureter and there was another stone approximately 1 cm in size.  This was again dusted with the laser fiber.  We then passed an additional sensor wire through the semirigid ureteroscope and the ureteroscope was removed.  A 12/14 F ureteral access sheath was advanced gently over the wire under fluoroscopic vision.  There was no resistance noted.  A single channel digital flexible ureteroscope was advanced to the sheath and thorough inspection of the kidney performed.  On flouroscopy we could see the residual stone in the mid poles as well as in the lower pole.  There was approximately 4 to 5 cm total of residual stone burden.  Thorough inspection of the upper pole revealed no residual stone.  For total of 2 hours we methodically worked our way down the kidney dusting the calyceal stones with settings of 0.5 Hz and 20 J.  At no point was any significant bleeding noted.  I felt very confident that the vast majority of the stone had been dusted at the conclusion of the case.  Contrast was injected through the flexible ureteroscope and no significant residual filling defects were noted.  Pullback ureteroscopy demonstrated a few small fragments in the ureter and these were also dusted.  A 6 French by 24 cm stent  was uneventfully placed under fluoroscopic vision with a good curl noted in the midpole as well as in the bladder.  The 21 French obturator was used to drain the bladder.  Disposition: Stable to PACU  Plan: Options moving forward include repeat ureteroscopy to confirm no residual fragments secondary to her massive stone  burden, versus repeat PCNL to suction out all the debris and any small residual fragments.  Most likely, will obtain a KUB in 10 to 14 days postop to evaluate whether an antegrade versus retrograde approach is best.  I'm concerned that if we remove her stent in 1 to 2 weeks she will end up with obstructing fragments secondary to her significant stone burden.  Legrand Rams, MD 12/27/2017

## 2017-12-27 NOTE — Anesthesia Procedure Notes (Signed)
Procedure Name: Intubation Date/Time: 12/27/2017 1:03 PM Performed by: Justus Memory, CRNA Pre-anesthesia Checklist: Patient identified, Patient being monitored, Timeout performed, Emergency Drugs available and Suction available Patient Re-evaluated:Patient Re-evaluated prior to induction Oxygen Delivery Method: Circle system utilized Preoxygenation: Pre-oxygenation with 100% oxygen Induction Type: IV induction Ventilation: Mask ventilation without difficulty Laryngoscope Size: Mac and 3 Grade View: Grade II Tube type: Oral Tube size: 7.0 mm Number of attempts: 1 Airway Equipment and Method: Stylet Placement Confirmation: ETT inserted through vocal cords under direct vision,  positive ETCO2 and breath sounds checked- equal and bilateral Secured at: 21 cm Tube secured with: Tape Dental Injury: Teeth and Oropharynx as per pre-operative assessment

## 2017-12-28 ENCOUNTER — Other Ambulatory Visit: Payer: Commercial Managed Care - PPO

## 2017-12-28 ENCOUNTER — Other Ambulatory Visit: Payer: Self-pay | Admitting: Radiology

## 2017-12-28 ENCOUNTER — Telehealth: Payer: Self-pay | Admitting: Urology

## 2017-12-28 ENCOUNTER — Encounter: Payer: Self-pay | Admitting: Urology

## 2017-12-28 DIAGNOSIS — N200 Calculus of kidney: Secondary | ICD-10-CM

## 2017-12-28 NOTE — Addendum Note (Signed)
Addended by: Sondra Come on: 12/28/2017 04:14 PM   Modules accepted: Orders

## 2017-12-28 NOTE — Telephone Encounter (Signed)
Please have Candice Robinson get a KUB on 11/6 or 11/7(ordered). Can cancel the post-op appointment, as she has repeat surgery scheduled with me 11/8.   I will call her later this week to discuss the plan for surgery 11/8.  Thanks Legrand Rams, MD 12/28/2017

## 2017-12-28 NOTE — Telephone Encounter (Signed)
Candice Robinson 10 day post op appt has been made for next Thurs, she states that she also needs xray prior per her discharge papers, please enter orders, pt advised to get xray prior to appt. Thanks

## 2017-12-29 ENCOUNTER — Other Ambulatory Visit: Payer: Self-pay | Admitting: Urology

## 2017-12-29 ENCOUNTER — Telehealth: Payer: Self-pay | Admitting: Urology

## 2017-12-29 ENCOUNTER — Other Ambulatory Visit: Payer: Commercial Managed Care - PPO

## 2017-12-29 MED ORDER — ONDANSETRON 4 MG PO TBDP: 4.0000 mg | ORAL_TABLET | Freq: Three times a day (TID) | ORAL | 0 refills | Status: DC | PRN

## 2017-12-29 NOTE — Telephone Encounter (Signed)
Spoke with daughter, Candice Robinson, and patient is feeling much better after taking the Zofran.  Her nausea has abated.  She does have the original Macrobid on hand, so she will discontinue the Macrodantin and start Macrobid tonight.

## 2017-12-29 NOTE — Telephone Encounter (Signed)
Pt mother called office on behalf of pt, had surgery earlier this week on Monday, pt is in a lot of pain and having nausea as well.  Pain seems to be a little better this morning with no pain meds, but did take the new antibiotic, that's stronger than what pt has taken in past.  Pt mother states Candice Robinson is so nauseated that she doesn't want to move or talk.  Please advise.

## 2017-12-29 NOTE — Progress Notes (Signed)
Sent in a script for Zofran to her pharmacy.  Spoke with the daughter and she is not having fevers or intense pain.  The nausea started shortly after taking the macrodantin this am.  We will speak again this afternoon and get an update on her status.   I did advise the patient that if her condition worsens such as high fevers, rigors, intractable nausea or pain, they should contact the office immediately or seek treatment in the ED.

## 2017-12-31 ENCOUNTER — Telehealth: Payer: Self-pay | Admitting: Radiology

## 2017-12-31 NOTE — Addendum Note (Signed)
Addended by: Sondra Come on: 12/31/2017 03:23 PM   Modules accepted: Orders

## 2017-12-31 NOTE — Telephone Encounter (Signed)
-----   Message from Sondra Come, MD sent at 12/31/2017  3:51 PM EDT ----- Regarding: RE: KUB Sorry! 11/5!  Thanks Arlys John ----- Message ----- From: Nicolasa Ducking, RN Sent: 12/31/2017   3:28 PM EDT To: Sondra Come, MD Subject: RE: KUB                                        Dr Richardo Hanks - surgery is scheduled 11/8. Should this be done after surgery? ----- Message ----- From: Sondra Come, MD Sent: 12/31/2017   3:23 PM EDT To: Leroy Libman Subject: KUB                                            Please facilitate patient obtaining KUB on Tuesday 11/12. Order is in.  Thanks Legrand Rams, MD 12/31/2017

## 2017-12-31 NOTE — Telephone Encounter (Signed)
Advised patient to get a KUB on 01/04/2018. Patient voices understanding.

## 2018-01-04 ENCOUNTER — Ambulatory Visit
Admission: RE | Admit: 2018-01-04 | Discharge: 2018-01-04 | Disposition: A | Discharge status: Home / Self Care | Payer: Commercial Managed Care - PPO | Source: Ambulatory Visit | Attending: Urology | Admitting: Urology

## 2018-01-04 ENCOUNTER — Telehealth: Payer: Self-pay | Admitting: Radiology

## 2018-01-04 DIAGNOSIS — N200 Calculus of kidney: Secondary | ICD-10-CM | POA: Diagnosis present

## 2018-01-04 NOTE — Telephone Encounter (Signed)
-----   Message from Sondra Come, MD sent at 01/04/2018 11:33 AM EST ----- Regarding: URS KUB looks great. We can do URS. I will call her now.  Thanks Arlys John ----- Message ----- From: Nicolasa Ducking, RN Sent: 01/04/2018   9:36 AM EST To: Sondra Come, MD  Patient had a kub this morning. Will we do a PCNL or URS on 02/06/2018?

## 2018-01-05 ENCOUNTER — Other Ambulatory Visit: Payer: Self-pay | Admitting: Radiology

## 2018-01-06 ENCOUNTER — Ambulatory Visit: Payer: Commercial Managed Care - PPO | Admitting: Urology

## 2018-01-06 MED ORDER — CIPROFLOXACIN IN D5W 400 MG/200ML IV SOLN
400.0000 mg | INTRAVENOUS | Status: AC
  Administered 2018-01-07: 400 mg via INTRAVENOUS

## 2018-01-07 ENCOUNTER — Ambulatory Visit: Payer: Commercial Managed Care - PPO | Admitting: Certified Registered"

## 2018-01-07 ENCOUNTER — Other Ambulatory Visit: Payer: Self-pay

## 2018-01-07 ENCOUNTER — Encounter: Payer: Self-pay | Admitting: *Deleted

## 2018-01-07 ENCOUNTER — Ambulatory Visit: Admission: RE | Admit: 2018-01-07 | Payer: Commercial Managed Care - PPO | Source: Ambulatory Visit

## 2018-01-07 ENCOUNTER — Encounter
Admission: RE | Disposition: A | Discharge status: Home / Self Care | Payer: Self-pay | Source: Ambulatory Visit | Attending: Urology

## 2018-01-07 ENCOUNTER — Ambulatory Visit
Admission: RE | Admit: 2018-01-07 | Discharge: 2018-01-07 | Disposition: A | Discharge status: Home / Self Care | Payer: Commercial Managed Care - PPO | Source: Ambulatory Visit | Attending: Urology | Admitting: Urology

## 2018-01-07 ENCOUNTER — Telehealth: Payer: Self-pay | Admitting: Urology

## 2018-01-07 DIAGNOSIS — N202 Calculus of kidney with calculus of ureter: Secondary | ICD-10-CM | POA: Insufficient documentation

## 2018-01-07 DIAGNOSIS — N200 Calculus of kidney: Secondary | ICD-10-CM | POA: Diagnosis present

## 2018-01-07 HISTORY — PX: CYSTOSCOPY/URETEROSCOPY/HOLMIUM LASER/STENT PLACEMENT: SHX6546

## 2018-01-07 LAB — POCT PREGNANCY, URINE: Preg Test, Ur: NEGATIVE

## 2018-01-07 SURGERY — CYSTOSCOPY/URETEROSCOPY/HOLMIUM LASER/STENT PLACEMENT
Anesthesia: General | Laterality: Right

## 2018-01-07 MED ORDER — KETOROLAC TROMETHAMINE 30 MG/ML IJ SOLN
INTRAMUSCULAR | Status: DC | PRN
  Administered 2018-01-07: 15 mg via INTRAVENOUS

## 2018-01-07 MED ORDER — FENTANYL CITRATE (PF) 100 MCG/2ML IJ SOLN
INTRAMUSCULAR | Status: DC | PRN
  Administered 2018-01-07 (×2): 25 ug via INTRAVENOUS
  Administered 2018-01-07: 50 ug via INTRAVENOUS

## 2018-01-07 MED ORDER — ONDANSETRON HCL 4 MG/2ML IJ SOLN
INTRAMUSCULAR | Status: AC
  Filled 2018-01-07: qty 2

## 2018-01-07 MED ORDER — SUGAMMADEX SODIUM 200 MG/2ML IV SOLN
INTRAVENOUS | Status: DC | PRN
  Administered 2018-01-07: 200 mg via INTRAVENOUS

## 2018-01-07 MED ORDER — PROPOFOL 10 MG/ML IV BOLUS
INTRAVENOUS | Status: AC
  Filled 2018-01-07: qty 20

## 2018-01-07 MED ORDER — HYDROCODONE-ACETAMINOPHEN 5-325 MG PO TABS: 1.0000 | ORAL_TABLET | ORAL | 0 refills | Status: AC | PRN

## 2018-01-07 MED ORDER — PROPOFOL 10 MG/ML IV BOLUS
INTRAVENOUS | Status: DC | PRN
  Administered 2018-01-07: 140 mg via INTRAVENOUS

## 2018-01-07 MED ORDER — KETOROLAC TROMETHAMINE 30 MG/ML IJ SOLN
INTRAMUSCULAR | Status: AC
  Filled 2018-01-07: qty 1

## 2018-01-07 MED ORDER — IOPAMIDOL (ISOVUE-M 200) INJECTION 41%
INTRAMUSCULAR | Status: DC | PRN
  Administered 2018-01-07: 15 mL

## 2018-01-07 MED ORDER — LACTATED RINGERS IV SOLN
INTRAVENOUS | Status: DC
  Administered 2018-01-07 (×2): via INTRAVENOUS

## 2018-01-07 MED ORDER — FAMOTIDINE 20 MG PO TABS
ORAL_TABLET | ORAL | Status: AC
  Filled 2018-01-07: qty 1

## 2018-01-07 MED ORDER — SUCCINYLCHOLINE CHLORIDE 20 MG/ML IJ SOLN
INTRAMUSCULAR | Status: DC | PRN
  Administered 2018-01-07: 100 mg via INTRAVENOUS

## 2018-01-07 MED ORDER — MIDAZOLAM HCL 2 MG/2ML IJ SOLN
INTRAMUSCULAR | Status: AC
  Filled 2018-01-07: qty 2

## 2018-01-07 MED ORDER — ROCURONIUM BROMIDE 100 MG/10ML IV SOLN
INTRAVENOUS | Status: DC | PRN
  Administered 2018-01-07: 5 mg via INTRAVENOUS
  Administered 2018-01-07 (×2): 10 mg via INTRAVENOUS
  Administered 2018-01-07: 20 mg via INTRAVENOUS
  Administered 2018-01-07: 10 mg via INTRAVENOUS

## 2018-01-07 MED ORDER — DEXAMETHASONE SODIUM PHOSPHATE 10 MG/ML IJ SOLN
INTRAMUSCULAR | Status: DC | PRN
  Administered 2018-01-07: 10 mg via INTRAVENOUS

## 2018-01-07 MED ORDER — FENTANYL CITRATE (PF) 100 MCG/2ML IJ SOLN
INTRAMUSCULAR | Status: AC
  Filled 2018-01-07: qty 2

## 2018-01-07 MED ORDER — SCOPOLAMINE 1 MG/3DAYS TD PT72
MEDICATED_PATCH | TRANSDERMAL | Status: AC
  Filled 2018-01-07: qty 1

## 2018-01-07 MED ORDER — ONDANSETRON HCL 4 MG/2ML IJ SOLN
INTRAMUSCULAR | Status: DC | PRN
  Administered 2018-01-07: 4 mg via INTRAVENOUS

## 2018-01-07 MED ORDER — LIDOCAINE HCL (CARDIAC) PF 100 MG/5ML IV SOSY
PREFILLED_SYRINGE | INTRAVENOUS | Status: DC | PRN
  Administered 2018-01-07: 100 mg via INTRAVENOUS

## 2018-01-07 MED ORDER — NITROFURANTOIN MONOHYD MACRO 100 MG PO CAPS: 100.0000 mg | ORAL_CAPSULE | Freq: Every day | ORAL | 0 refills | Status: AC

## 2018-01-07 MED ORDER — EPHEDRINE SULFATE 50 MG/ML IJ SOLN
INTRAMUSCULAR | Status: DC | PRN
  Administered 2018-01-07 (×6): 5 mg via INTRAVENOUS

## 2018-01-07 MED ORDER — DEXAMETHASONE SODIUM PHOSPHATE 10 MG/ML IJ SOLN
INTRAMUSCULAR | Status: AC
  Filled 2018-01-07: qty 1

## 2018-01-07 MED ORDER — SUGAMMADEX SODIUM 200 MG/2ML IV SOLN
INTRAVENOUS | Status: AC
  Filled 2018-01-07: qty 2

## 2018-01-07 MED ORDER — EPHEDRINE SULFATE 50 MG/ML IJ SOLN
INTRAMUSCULAR | Status: AC
  Filled 2018-01-07: qty 1

## 2018-01-07 MED ORDER — SCOPOLAMINE 1 MG/3DAYS TD PT72
1.0000 | MEDICATED_PATCH | TRANSDERMAL | Status: DC
  Administered 2018-01-07: 1.5 mg via TRANSDERMAL

## 2018-01-07 MED ORDER — FAMOTIDINE 20 MG PO TABS
20.0000 mg | ORAL_TABLET | Freq: Once | ORAL | Status: AC
  Administered 2018-01-07: 20 mg via ORAL

## 2018-01-07 MED ORDER — PHENYLEPHRINE HCL 10 MG/ML IJ SOLN
INTRAMUSCULAR | Status: DC | PRN
  Administered 2018-01-07 (×4): 50 ug via INTRAVENOUS

## 2018-01-07 MED ORDER — MIDAZOLAM HCL 2 MG/2ML IJ SOLN
INTRAMUSCULAR | Status: DC | PRN
  Administered 2018-01-07: 2 mg via INTRAVENOUS

## 2018-01-07 MED ORDER — FENTANYL CITRATE (PF) 100 MCG/2ML IJ SOLN: 25.0000 ug | INTRAMUSCULAR | Status: DC | PRN

## 2018-01-07 MED ORDER — PROMETHAZINE HCL 25 MG/ML IJ SOLN: 6.2500 mg | INTRAMUSCULAR | Status: DC | PRN

## 2018-01-07 MED ORDER — CIPROFLOXACIN IN D5W 400 MG/200ML IV SOLN
INTRAVENOUS | Status: AC
  Filled 2018-01-07: qty 200

## 2018-01-07 SURGICAL SUPPLY — 31 items
BAG DRAIN CYSTO-URO LG1000N (MISCELLANEOUS) ×2 IMPLANT
BASKET ZERO TIP 1.9FR (BASKET) ×1 IMPLANT
BRUSH SCRUB EZ 1% IODOPHOR (MISCELLANEOUS) ×2 IMPLANT
BULB IRRIG PATHFIND (MISCELLANEOUS) ×1 IMPLANT
CATH URETL 5X70 OPEN END (CATHETERS) ×1 IMPLANT
CNTNR SPEC 2.5X3XGRAD LEK (MISCELLANEOUS)
CONT SPEC 4OZ STER OR WHT (MISCELLANEOUS)
CONTAINER SPEC 2.5X3XGRAD LEK (MISCELLANEOUS) IMPLANT
DRAPE UTILITY 15X26 TOWEL STRL (DRAPES) ×2 IMPLANT
FIBER LASER LITHO 273 (Laser) ×1 IMPLANT
GLOVE BIOGEL PI IND STRL 7.5 (GLOVE) ×1 IMPLANT
GLOVE BIOGEL PI INDICATOR 7.5 (GLOVE) ×1
GOWN STRL REUS W/ TWL LRG LVL3 (GOWN DISPOSABLE) ×2 IMPLANT
GOWN STRL REUS W/TWL LRG LVL3 (GOWN DISPOSABLE) ×2
INFUSOR MANOMETER BAG 3000ML (MISCELLANEOUS) ×1 IMPLANT
INTRODUCER DILATOR DOUBLE (INTRODUCER) IMPLANT
KIT TURNOVER CYSTO (KITS) ×2 IMPLANT
PACK CYSTO AR (MISCELLANEOUS) ×2 IMPLANT
SENSORWIRE 0.038 NOT ANGLED (WIRE) ×4
SET CYSTO W/LG BORE CLAMP LF (SET/KITS/TRAYS/PACK) ×2 IMPLANT
SHEATH URETERAL 12FRX35CM (MISCELLANEOUS) IMPLANT
SOL .9 NS 3000ML IRR  AL (IV SOLUTION) ×1
SOL .9 NS 3000ML IRR UROMATIC (IV SOLUTION) ×1 IMPLANT
STENT URET 6FRX24 CONTOUR (STENTS) ×1 IMPLANT
STENT URET 6FRX26 CONTOUR (STENTS) IMPLANT
SURGILUBE 2OZ TUBE FLIPTOP (MISCELLANEOUS) ×2 IMPLANT
SYR 10ML LL (SYRINGE) ×2 IMPLANT
TUBING ART PRESS 48 MALE/FEM (TUBING) ×1 IMPLANT
VALVE UROSEAL ADJ ENDO (VALVE) ×1 IMPLANT
WATER STERILE IRR 1000ML POUR (IV SOLUTION) ×2 IMPLANT
WIRE SENSOR 0.038 NOT ANGLED (WIRE) ×1 IMPLANT

## 2018-01-07 NOTE — Telephone Encounter (Signed)
App made and gave to the nurse in post op   [place]

## 2018-01-07 NOTE — Anesthesia Post-op Follow-up Note (Signed)
Anesthesia QCDR form completed.        

## 2018-01-07 NOTE — Anesthesia Postprocedure Evaluation (Signed)
Anesthesia Post Note  Patient: Candice Robinson  Procedure(s) Performed: CYSTOSCOPY/URETEROSCOPY/HOLMIUM LASER/STENT Exchange (Right )  Patient location during evaluation: PACU Anesthesia Type: General Level of consciousness: awake and alert Pain management: pain level controlled Vital Signs Assessment: post-procedure vital signs reviewed and stable Respiratory status: spontaneous breathing, nonlabored ventilation, respiratory function stable and patient connected to nasal cannula oxygen Cardiovascular status: blood pressure returned to baseline and stable Postop Assessment: no apparent nausea or vomiting Anesthetic complications: no     Last Vitals:  Vitals:   01/07/18 1543 01/07/18 1554  BP: 126/72 129/75  Pulse: 79 87  Resp: 18 16  Temp:  36.6 C  SpO2: 97% 100%    Last Pain:  Vitals:   01/07/18 1554  TempSrc: Temporal  PainSc: 0-No pain                 Vitor Overbaugh S

## 2018-01-07 NOTE — Anesthesia Preprocedure Evaluation (Signed)
Anesthesia Evaluation  Patient identified by MRN, date of birth, ID band Patient awake    Reviewed: Allergy & Precautions, NPO status , Patient's Chart, lab work & pertinent test results  History of Anesthesia Complications (+) PONV and history of anesthetic complications  Airway Mallampati: II  TM Distance: >3 FB Neck ROM: Full    Dental no notable dental hx. (+) Dental Advidsory Given   Pulmonary neg pulmonary ROS, neg sleep apnea, neg COPD,    breath sounds clear to auscultation- rhonchi (-) wheezing      Cardiovascular Exercise Tolerance: Good (-) hypertension(-) CAD and (-) Past MI  Rhythm:Regular Rate:Normal - Systolic murmurs and - Diastolic murmurs    Neuro/Psych negative neurological ROS  negative psych ROS   GI/Hepatic negative GI ROS, Neg liver ROS,   Endo/Other  negative endocrine ROSneg diabetes  Renal/GU Renal disease (nephrolithiasis)     Musculoskeletal negative musculoskeletal ROS (+)   Abdominal (+) + obese,   Peds  Hematology negative hematology ROS (+)   Anesthesia Other Findings Past Medical History: No date: Kidney stone   Reproductive/Obstetrics negative OB ROS                             Anesthesia Physical  Anesthesia Plan  ASA: II  Anesthesia Plan: General   Post-op Pain Management:    Induction: Intravenous  PONV Risk Score and Plan: 2 and Ondansetron, Dexamethasone, Midazolam, Scopolamine patch - Pre-op, Promethazine and Treatment may vary due to age or medical condition  Airway Management Planned: Oral ETT  Additional Equipment:   Intra-op Plan:   Post-operative Plan: Extubation in OR  Informed Consent: I have reviewed the patients History and Physical, chart, labs and discussed the procedure including the risks, benefits and alternatives for the proposed anesthesia with the patient or authorized representative who has indicated his/her  understanding and acceptance.   Dental advisory given  Plan Discussed with: CRNA and Anesthesiologist  Anesthesia Plan Comments:         Anesthesia Quick Evaluation

## 2018-01-07 NOTE — Telephone Encounter (Signed)
-----   Message from Sondra Come, MD sent at 01/07/2018  3:44 PM EST ----- Regarding: stent removal Please schedule stent removal Thursday 11/14. Ok to [employer].  Thanks Legrand Rams, MD 01/07/2018

## 2018-01-07 NOTE — H&P (Signed)
UROLOGY H&P UPDATE  Agree with prior H&P dated 12/15/2017  Cardiac: RRR Lungs: CTA bilaterally  Laterality: Right Procedure: Right ureteroscopy, laser lithotripsy, stent exchange  Urinalysis: Urine culture 12/20/2017 growth  Informed consent obtained, we specifically discussed the risk of bleeding, infection/abscess, possible need for additional procedures, ureteral injury, and stent related symptoms.  Sondra Come, MD 01/07/2018

## 2018-01-07 NOTE — Anesthesia Procedure Notes (Addendum)
Procedure Name: Intubation Date/Time: 01/07/2018 12:53 PM Performed by: Lavone Orn, CRNA Pre-anesthesia Checklist: Patient identified, Emergency Drugs available, Suction available, Patient being monitored and Timeout performed Patient Re-evaluated:Patient Re-evaluated prior to induction Oxygen Delivery Method: Circle system utilized Preoxygenation: Pre-oxygenation with 100% oxygen Induction Type: IV induction Ventilation: Mask ventilation without difficulty Laryngoscope Size: Mac and 3 Grade View: Grade I Tube type: Oral Tube size: 7.0 mm Number of attempts: 1 Airway Equipment and Method: Patient positioned with wedge pillow and Stylet Secured at: 21 cm Tube secured with: Tape Dental Injury: Teeth and Oropharynx as per pre-operative assessment

## 2018-01-07 NOTE — Discharge Instructions (Addendum)
Cystoscopy, Care After °Refer to this sheet in the next few weeks. These instructions provide you with information about caring for yourself after your procedure. Your health care provider may also give you more specific instructions. Your treatment has been planned according to current medical practices, but problems sometimes occur. Call your health care provider if you have any problems or questions after your procedure. °What can I expect after the procedure? °After the procedure, it is common to have: °· Mild pain when you urinate. Pain should stop within a few minutes after you urinate. This may last for up to 1 week. °· A small amount of blood in your urine for several days. °· Feeling like you need to urinate but producing only a small amount of urine. ° °Follow these instructions at home: ° °Medicines °· Take over-the-counter and prescription medicines only as told by your health care provider. °· If you were prescribed an antibiotic medicine, take it as told by your health care provider. Do not stop taking the antibiotic even if you start to feel better. °General instructions ° °· Return to your normal activities as told by your health care provider. Ask your health care provider what activities are safe for you. °· Do not drive for 24 hours if you received a sedative. °· Watch for any blood in your urine. If the amount of blood in your urine increases, call your health care provider. °· Follow instructions from your health care provider about eating or drinking restrictions. °· If a tissue sample was removed for testing (biopsy) during your procedure, it is your responsibility to get your test results. Ask your health care provider or the department performing the test when your results will be ready. °· Drink enough fluid to keep your urine clear or pale yellow. °· Keep all follow-up visits as told by your health care provider. This is important. °Contact a health care provider if: °· You have pain that  gets worse or does not get better with medicine, especially pain when you urinate. °· You have difficulty urinating. °Get help right away if: °· You have more blood in your urine. °· You have blood clots in your urine. °· You have abdominal pain. °· You have a fever or chills. °· You are unable to urinate. °This information is not intended to replace advice given to you by your health care provider. Make sure you discuss any questions you have with your health care provider. °Document Released: 09/05/2004 Document Revised: 07/25/2015 Document Reviewed: 01/03/2015 °Elsevier Interactive Patient Education © 2018 Elsevier Inc[employer] ° ° ° °Ureteroscopy, Care After °This sheet gives you information about how to care for yourself after your procedure. Your health care provider may also give you more specific instructions. If you have problems or questions, contact your health care provider. °What can I expect after the procedure? °After the procedure, it is common to have: °· A burning sensation when you urinate. °· Blood in your urine. °· Mild discomfort in the bladder area or kidney area when urinating. °· Needing to urinate more often or urgently. ° °Follow these instructions at home: °Medicines °· Take over-the-counter and prescription medicines only as told by your health care provider. °· If you were prescribed an antibiotic medicine, take it as told by your health care provider. Do not stop taking the antibiotic even if you start to feel better. °General instructions ° °· Do not drive for 24 hours if you were given a medicine to help you relax (sedative) during   your procedure. °· To relieve burning, try taking a warm bath or holding a warm washcloth over your groin. °· Drink enough fluid to keep your urine clear or pale yellow. °? Drink two 8-ounce glasses of water every hour for the first 2 hours after you get home. °? Continue to drink water often at home. °· You can eat what you usually do. °· Keep all follow-up  visits as told by your health care provider. This is important. °? If you had a tube placed to keep urine flowing (ureteral stent), ask your health care provider when you need to return to have it removed. °Contact a health care provider if: °· You have chills or a fever. °· You have burning pain for longer than 24 hours after the procedure. °· You have blood in your urine for longer than 24 hours after the procedure. °Get help right away if: °· You have large amounts of blood in your urine. °· You have blood clots in your urine. °· You have very bad pain. °· You have chest pain or trouble breathing. °· You are unable to urinate and you have the feeling of a full bladder. °This information is not intended to replace advice given to you by your health care provider. Make sure you discuss any questions you have with your health care provider. °Document Released: 02/21/2013 Document Revised: 12/03/2015 Document Reviewed: 11/29/2015 °Elsevier Interactive Patient Education © 2018 Elsevier Inc. ° °AMBULATORY SURGERY  °DISCHARGE INSTRUCTIONS ° ° °1) The drugs that you were given will stay in your system until tomorrow so for the next 24 hours you should not: ° °A) Drive an automobile °B) Make any legal decisions °C) Drink any alcoholic beverage ° ° °2) You may resume regular meals tomorrow.  Today it is better to start with liquids and gradually work up to solid foods. ° °You may eat anything you prefer, but it is better to start with liquids, then soup and crackers, and gradually work up to solid foods. ° ° °3) Please notify your doctor immediately if you have any unusual bleeding, trouble breathing, redness and pain at the surgery site, drainage, fever, or pain not relieved by medication. ° ° ° °4) Additional Instructions: ° ° ° ° ° ° ° °Please contact your physician with any problems or Same Day Surgery at [phone], Monday through Friday 6 am to 4 pm, or [facility] number at [phone]. ° °

## 2018-01-07 NOTE — Transfer of Care (Signed)
Immediate Anesthesia Transfer of Care Note  Patient: TESHIA MAHONE  Procedure(s) Performed: CYSTOSCOPY/URETEROSCOPY/HOLMIUM LASER/STENT Exchange (Right )  Patient Location: PACU  Anesthesia Type:General  Level of Consciousness: awake  Airway & Oxygen Therapy: Patient Spontanous Breathing and Patient connected to face mask  Post-op Assessment: Report given to RN and Post -op Vital signs reviewed and stable  Post vital signs: stable  Last Vitals:  Vitals Value Taken Time  BP 125/76 01/07/2018  3:03 PM  Temp 36.8 C 01/07/2018  3:03 PM  Pulse 88 01/07/2018  3:11 PM  Resp 21 01/07/2018  3:11 PM  SpO2 100 % 01/07/2018  3:11 PM  Vitals shown include unvalidated device data.  Last Pain:  Vitals:   01/07/18 1207  TempSrc: Temporal  PainSc: 0-No pain         Complications: No apparent anesthesia complications

## 2018-01-07 NOTE — Op Note (Signed)
Date of procedure: 01/07/18  Preoperative diagnosis:  1. Right renal stones  Postoperative diagnosis:  1. Same  Procedure: 1. Cystoscopy, right ureteroscopy, laser lithotripsy, stent exchange 2. Right retrograde pyelogram with intraoperative interpretation  Surgeon: Legrand Rams, MD  Anesthesia: General  Complications: None  Intraoperative findings:  1.  Minimal residual stone burden and kidney 2.  Ureteral stones fragmented and basketed free 3.  Uncomplicated stent placement  EBL: Minimal  Specimens: None  Drains: Right 6 French by 24 cm stent  Indication: Candice Robinson is a 47 y.o. 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  She presents for repeat ureteroscopy to clear her residual stone burden.  After reviewing the management options for treatment, they elected to proceed with the above surgical procedure(s). We have discussed the potential benefits and risks of the procedure, side effects of the proposed treatment, the likelihood of the patient achieving the goals of the procedure, and any potential problems that might occur during the procedure or recuperation. Informed consent has been obtained.  Description of procedure:  The patient was taken to the operating room and general anesthesia was induced.  The patient was placed in the dorsal lithotomy position, prepped and draped in the usual sterile fashion, and preoperative antibiotics were administered. A preoperative time-out was performed.   The 21 French rigid cystoscope with a 30 degree lens was used to intubate the urethra.  Cystoscopy was grossly normal.  The right ureteral stent was grasped and pulled to the meatus.  Attempted to wire this with a sensor wire, however there was significant resistance and I suspect the stent was clogged with a stone dust from prior procedure.  The stent was removed in its entirety.  We then reentered the bladder with the  rigid scope and a sensor wire advanced easily up the ureter under fluoroscopic vision.  I then used a semirigid ureteroscope and dusted approximately 1 cm worth of residual stone fragments in the mid ureter.  The ureter did not appear to have any residual stone at this point.  I then added a additional safety wire through the semirigid scope and pullback ureteroscopy demonstrated no residual fragments.  A 12/14 French access sheath was gently placed over the wire under fluoroscopic vision.  The flexible ureteroscope was then advanced through the sheath up into the renal pelvis.  The kidney was thoroughly inspected and there was no residual stone burden in the mid or upper poles.  The lower pole did have some small residual fragments and these were basketed free using the encircle basket.  Thorough pyeloscopy revealed no additional stone fragments.  I injected contrast through the scope which opacified the collecting system and again no filling defects were noted. Careful pullback ureteroscopy did demonstrate some small residual fragments 2 to 3 mm in size along the course of the ureter that had likely washed down from above.  The sheath was removed, and pullback ureteroscopy demonstrated no ureteral injury.  I then reentered the ureter with the semirigid ureteroscope and the residual stones and mucus were basketed free.  At the conclusion there were no residual significant fragments in the ureter.  Retrograde pyelogram via the semirigid ureteroscope confirmed a widely dilated ureter with no filling defects.  A 6 French by 24 cm stent was uneventfully placed under fluoroscopic vision with an excellent curl noted in the renal pelvis.  I reentered the bladder with the rigid cystoscope and an excellent curl was seen in the  bladder.  Residual stone fragments from the bladder were irrigated free, and the bladder was drained.  Disposition: Stable to PACU  Plan: Continue Macrobid prophylaxis Continue Flomax Stent  removal in clinic Thursday, 01/13/2018  Legrand Rams, MD 01/07/2018

## 2018-01-08 ENCOUNTER — Encounter: Payer: Self-pay | Admitting: Urology

## 2018-01-13 ENCOUNTER — Encounter: Payer: Self-pay | Admitting: Urology

## 2018-01-13 ENCOUNTER — Other Ambulatory Visit: Payer: Self-pay

## 2018-01-13 ENCOUNTER — Ambulatory Visit (INDEPENDENT_AMBULATORY_CARE_PROVIDER_SITE_OTHER): Payer: Commercial Managed Care - PPO | Admitting: Urology

## 2018-01-13 VITALS — BP 116/80 | HR 99 | Ht 63.0 in | Wt 153.0 lb

## 2018-01-13 DIAGNOSIS — N200 Calculus of kidney: Secondary | ICD-10-CM | POA: Diagnosis not present

## 2018-01-13 LAB — URINALYSIS, COMPLETE
Bilirubin, UA: NEGATIVE
GLUCOSE, UA: NEGATIVE
KETONES UA: NEGATIVE
NITRITE UA: NEGATIVE
SPEC GRAV UA: 1.025 (ref 1.005–1.030)
Urobilinogen, Ur: 0.2 mg/dL (ref 0.2–1.0)
pH, UA: 5.5 (ref 5.0–7.5)

## 2018-01-13 LAB — MICROSCOPIC EXAMINATION: Epithelial Cells (non renal): >10 /hpf — ABNORMAL HIGH (ref 0–10)

## 2018-01-13 MED ORDER — KETOROLAC TROMETHAMINE 10 MG PO TABS: 10.0000 mg | ORAL_TABLET | Freq: Four times a day (QID) | ORAL | 0 refills | Status: DC | PRN

## 2018-01-13 NOTE — Progress Notes (Signed)
Cystoscopy Procedure Note:  Indication: Stent removal s/p RIGHT PCNL, URS X2  After informed consent and discussion of the procedure and its risks, Braelee S Sherald BargeWilborn was positioned and prepped in the standard fashion. Cystoscopy was performed with a flexible cystoscope. The stent was grasped with flexible graspers and removed in its entirety. The patient tolerated the procedure well.  Findings: Uncomplicated stent removal  Assessment and Plan: Follow up in 6 weeks to review 24 hour urine results 6 month KUB  Sondra ComeBrian C Kodi Steil, MD 01/13/2018

## 2018-01-13 NOTE — Addendum Note (Signed)
Addended by: Sondra ComeSNINSKY, Yon Schiffman C on: 01/13/2018 09:21 AM   Modules accepted: Orders

## 2018-01-17 ENCOUNTER — Other Ambulatory Visit: Payer: Self-pay | Admitting: Family Medicine

## 2018-01-18 NOTE — Telephone Encounter (Signed)
Requested Prescriptions  Pending Prescriptions Disp Refills  . tamsulosin (FLOMAX) 0.4 MG CAPS capsule [Pharmacy Med Name: TAMSULOSIN HCL 0.4 MG CAPSULE] 90 capsule 1    Sig: TAKE 1 CAPSULE BY MOUTH EVERY DAY     Urology: Alpha-Adrenergic Blocker Passed - 01/17/2018  1:39 AM      Passed - Last BP in normal range    BP Readings from Last 1 Encounters:  01/13/18 116/80         Passed - Valid encounter within last 12 months    Recent Outpatient Visits          1 month ago Lower abdominal pain   [facility] Ro[place]velt MaserLane, Rachel [place]   2 months ago Flank pain   [facility] MaserLane, Rachel [place], [place]   3 months ago Pyelonephritis   [facility] MaserLane, Rachel [place], [place]   4 months ago Acute lower UTI   [facility] M[facility]Lane, Rachel [place], [place]   4 months ago Acute lower UTI   [facility] Lane, Salley Hewsachel [place], [place]      Future Appointments            In 1 month Richardo HanksSninsky, Laurette SchimkeBrian C, MD [facility]   In 7 months Laural BenesJohnson, Oralia RudMegan P, DO [facility]

## 2018-01-25 ENCOUNTER — Telehealth: Payer: Self-pay | Admitting: Urology

## 2018-01-25 NOTE — Telephone Encounter (Signed)
Pt called lmom stating she still has not received her litholink kit, Please advise pt. Thank you.

## 2018-01-26 NOTE — Telephone Encounter (Signed)
Order faxed.

## 2018-01-31 ENCOUNTER — Ambulatory Visit: Payer: Commercial Managed Care - PPO | Admitting: Urology

## 2018-01-31 ENCOUNTER — Encounter: Payer: Self-pay | Admitting: Urology

## 2018-01-31 VITALS — BP 143/87 | HR 80 | Ht 63.0 in | Wt 157.7 lb

## 2018-01-31 DIAGNOSIS — R300 Dysuria: Secondary | ICD-10-CM

## 2018-01-31 DIAGNOSIS — N200 Calculus of kidney: Secondary | ICD-10-CM

## 2018-01-31 LAB — URINALYSIS, COMPLETE
BILIRUBIN UA: NEGATIVE
Glucose, UA: NEGATIVE
KETONES UA: NEGATIVE
LEUKOCYTES UA: NEGATIVE
Nitrite, UA: POSITIVE — AB
RBC, UA: NEGATIVE
Specific Gravity, UA: >1.03 — ABNORMAL HIGH (ref 1.005–1.030)
Urobilinogen, Ur: 0.2 mg/dL (ref 0.2–1.0)
pH, UA: 5.5 (ref 5.0–7.5)

## 2018-01-31 LAB — MICROSCOPIC EXAMINATION: WBC, UA: NONE SEEN /hpf (ref 0–5)

## 2018-01-31 MED ORDER — AMOXICILLIN-POT CLAVULANATE 875-125 MG PO TABS: 1.0000 | ORAL_TABLET | Freq: Two times a day (BID) | ORAL | 0 refills | Status: DC

## 2018-01-31 NOTE — Progress Notes (Signed)
01/31/2018 1:28 PM   Candice Robinson 02/12/1971 [id number]  Referring provider: Valerie Roys, DO [place], [place] [address]  Chief Complaint  Patient presents with  . Dysuria    Tried AZO no relief     HPI: Patient is a 47 year old Caucasian female with a history of recurrent urinary tract infections and a right staghorn calculus who presents today with a complaints of a possible UTI.    She underwent right PCNL on December 03, 2017 with Dr. Diamantina Providence.  She was admitted overnight as standard protocol and discharged the next day.   The stone could not be completely fragmented, so a right ureteral stent was placed in anticipation of a staged procedure in the future.  She then returned on December 10, 2017 to the ED complaining of right-sided flank pain, tachycardia and low-grade fever.  CT at that time noted stent was in good position and a right urinoma.  She was admitted for IV antibiotics and pain control.  Urine culture grew less than 10,000 colonies consistent with insignificant growth, and blood cultures were negative.  She was admitted and started on a two-week course of Bactrim for presumed pyelonephritis.    She then underwent right URS with stent exchange on December 19, 2017 with Dr. Diamantina Providence.  She had intense nausea after the procedure and was given a prescription for Zofran and her Macrodantin was changed to [place].    She then underwent cystoscopy, right ureteroscopy, laser lithotripsy and stent exchange on January 07, 2018 with Dr. Diamantina Providence.    She then presented to the office on January 13, 2018 for cystoscopy and stent removal.  She was instructed to follow-up in 6 weeks to review 24-hour urine results and in 6 months for KUB.    She states that the right flank has not felt right since her procedures.  She states that she still has pain in the right flank area.  She states it is getting better as time goes by, but she is stating "something just is not right."  Over  the weekend, she started having feelings of strong urgency and frequency.  She states that she feels like "her bladder is about to fall out."  She had been taking an AZO type pill over the weekend.  She did not take one today.  She states that her bladder symptoms were not present this morning.  Her UA today her urine is nitrite positive with many bacteria.   Patient denies any gross hematuria.  Patient denies any fevers, chills, nausea or vomiting.   PMH: Past Medical History:  Diagnosis Date  . Complication of anesthesia    ponv   . Kidney stone   . PONV (postoperative nausea and vomiting)    Requires phenergan for both procedures    Surgical History: Past Surgical History:  Procedure Laterality Date  . CYSTOSCOPY WITH STENT PLACEMENT Right 12/03/2017   Procedure: CYSTOSCOPY WITH STENT PLACEMENT;  Surgeon: Billey Co, MD;  Location: [facility] ORS;  Service: Urology;  Laterality: Right;  . CYSTOSCOPY/URETEROSCOPY/HOLMIUM LASER/STENT PLACEMENT Right 12/27/2017   Procedure: CYSTOSCOPY/URETEROSCOPY/HOLMIUM LASER/STENT Exchange;  Surgeon: Billey Co, MD;  Location: [facility] ORS;  Service: Urology;  Laterality: Right;  Large renal stone  . CYSTOSCOPY/URETEROSCOPY/HOLMIUM LASER/STENT PLACEMENT Right 01/07/2018   Procedure: CYSTOSCOPY/URETEROSCOPY/HOLMIUM LASER/STENT Exchange;  Surgeon: Billey Co, MD;  Location: [facility] ORS;  Service: Urology;  Laterality: Right;  Large renal stone  . HOLMIUM LASER APPLICATION Right 99/05/7167   Procedure: HOLMIUM  LASER APPLICATION;  Surgeon: Billey Co, MD;  Location: [facility] ORS;  Service: Urology;  Laterality: Right;  . IR NEPHROSTOMY PLACEMENT RIGHT  12/03/2017  . NEPHROLITHOTOMY Right 12/03/2017   Procedure: NEPHROLITHOTOMY PERCUTANEOUS;  Surgeon: Billey Co, MD;  Location: [facility] ORS;  Service: Urology;  Laterality: Right;  . TUBAL LIGATION  1996  . tummy tuck  2004    Home Medications:  Allergies as of 01/31/2018      Reactions   Bactrim  [sulfamethoxazole-trimethoprim] Shortness Of Breath, Nausea Only, Other (See Comments)   Fatigue   Codeine Other (See Comments)   Hallucinations   Dilaudid [hydromorphone Hcl] Nausea Only, Other (See Comments)   Extreme sweating   Gluten Meal Other (See Comments)   Stomach bloating, indigestion       Medication List        Accurate as of 01/31/18  1:28 PM. Always use your most recent med list.          acetaminophen 500 MG tablet Commonly known as:  TYLENOL Take 2 tablets (1,000 mg total) by mouth every 6 (six) hours as needed for mild pain (or Fever >/= 101).   amoxicillin-clavulanate 875-125 MG tablet Commonly known as:  AUGMENTIN Take 1 tablet by mouth every 12 (twelve) hours.   diphenhydrAMINE 25 MG tablet Commonly known as:  BENADRYL Take 25 mg by mouth at bedtime as needed for allergies.   ibuprofen 200 MG tablet Commonly known as:  ADVIL,MOTRIN Take 3 tablets (600 mg total) by mouth every 6 (six) hours as needed for headache or moderate pain.   oxybutynin 10 MG 24 hr tablet Commonly known as:  DITROPAN-XL Take 1 tablet (10 mg total) by mouth daily. Take daily while stent is in to reduce stent pain and irritation   PROBIOTIC DAILY PO Take 1 capsule by mouth daily.       Allergies:  Allergies  Allergen Reactions  . Bactrim [Sulfamethoxazole-Trimethoprim] Shortness Of Breath, Nausea Only and Other (See Comments)    Fatigue  . Codeine Other (See Comments)    Hallucinations  . Dilaudid [Hydromorphone Hcl] Nausea Only and Other (See Comments)    Extreme sweating  . Gluten Meal Other (See Comments)    Stomach bloating, indigestion     Family History: Family History  Problem Relation Age of Onset  . Diabetes Mother   . Hyperlipidemia Mother   . Hypertension Mother   . Hyperlipidemia Father   . Hypertension Father   . Heart disease Father   . Lupus Sister   . Diabetes Sister   . Hypertension Sister   . Breast cancer Maternal Aunt   . Breast cancer  Maternal Aunt   . Bladder Cancer Neg Hx   . Kidney cancer Neg Hx     Social History:  reports that she has never smoked. She has never used smokeless tobacco. She reports that she drinks alcohol. She reports that she does not use drugs.  ROS: UROLOGY Frequent Urination?: No Hard to postpone urination?: No Burning/pain with urination?: No Get up at night to urinate?: No Leakage of urine?: No Urine stream starts and stops?: No Trouble starting stream?: No Do you have to strain to urinate?: No Blood in urine?: No Urinary tract infection?: Yes Sexually transmitted disease?: No Injury to kidneys or bladder?: No Painful intercourse?: No Weak stream?: No Currently pregnant?: No Vaginal bleeding?: No  Gastrointestinal Nausea?: No Vomiting?: No Indigestion/heartburn?: No Diarrhea?: No Constipation?: No  Constitutional Fever: No Night sweats?: No Weight loss?:  No Fatigue?: No  Skin Skin rash/lesions?: No Itching?: No  Eyes Blurred vision?: No Double vision?: No  Ears/Nose/Throat Sore throat?: No Sinus problems?: No  Hematologic/Lymphatic Swollen glands?: No Easy bruising?: No  Cardiovascular Leg swelling?: No Chest pain?: No  Respiratory Cough?: No Shortness of breath?: No  Endocrine Excessive thirst?: No  Musculoskeletal Back pain?: No Joint pain?: No  Neurological Headaches?: No Dizziness?: No  Psychologic Depression?: No Anxiety?: No  Physical Exam: BP (!) 143/87 (BP Location: Left Arm, Patient Position: Sitting, Cuff Size: Normal)   Pulse 80   Ht _0  (1.6 m)   Wt 157 lb 11.2 oz (71.5 kg)   LMP 01/11/2018   BMI 27.94 kg/m   Constitutional:  Well nourished. Alert and oriented, No acute distress. HEENT: [place] AT, moist mucus membranes.  Trachea midline, no masses. Cardiovascular: No clubbing, cyanosis, or edema. Respiratory: Normal respiratory effort, no increased work of breathing. GI: Abdomen is soft, non tender, non distended, no  abdominal masses. Liver and spleen not palpable.  No hernias appreciated.  Stool sample for occult testing is not indicated.   GU: No CVA tenderness.  No bladder fullness or masses.   Skin: No rashes, bruises or suspicious lesions. Neurologic: Grossly intact, no focal deficits, moving all 4 extremities. Psychiatric: Normal mood and affect.  Laboratory Data: Lab Results  Component Value Date   WBC 19.0 (H) 12/11/2017   HGB 9.2 (L) 12/11/2017   HCT 29.8 (L) 12/11/2017   MCV 76.2 (L) 12/11/2017   PLT 378 12/11/2017    Lab Results  Component Value Date   CREATININE 0.79 12/11/2017    No results found for: PSA  No results found for: TESTOSTERONE  No results found for: HGBA1C  Lab Results  Component Value Date   TSH 3.980 08/12/2017       Component Value Date/Time   CHOL 185 08/12/2017 0940   HDL 33 (L) 08/12/2017 0940   LDLCALC 124 (H) 08/12/2017 0940    Lab Results  Component Value Date   AST 33 12/10/2017   Lab Results  Component Value Date   ALT 38 12/10/2017   No components found for: ALKALINEPHOPHATASE No components found for: BILIRUBINTOTAL  No results found for: ESTRADIOL  Urinalysis UA nitrite positive with many bacteria.  See Epic.  I have reviewed the labs.  Assessment & Plan:    1. Dysuria - Urinalysis, Complete -nitrite positive likely due to AZO tablets, but will send for culture as she is symptomatic with strong urgency frequency and suprapubic pain of a sudden onset -We will start Augmentin 875/125, twice daily and will adjust if appropriate once urine culture and sensitivities are available -If urine culture is negative and patient is still experiencing symptoms we will obtain a renal ultrasound to rule out hydronephrosis  2.  History of staghorn calculi -Patient has received her Litholink collection kit, but she will not start it until we have the culture results back -She is given a handout on ABCs of stone prevention and advised not to  institute any of the suggestions until after her 23-RAQT metabolic work-up is completed as we would like to have her and her native state for testing purposes   Return for pending urine culture results .  These notes generated with voice recognition software. I apologize for typographical errors.  Zara Council, PA-C  [facility] [address]  [place], [place] [address] [phone]

## 2018-02-03 ENCOUNTER — Telehealth: Payer: Self-pay | Admitting: Urology

## 2018-02-03 ENCOUNTER — Other Ambulatory Visit: Payer: Self-pay | Admitting: Urology

## 2018-02-03 DIAGNOSIS — R109 Unspecified abdominal pain: Secondary | ICD-10-CM

## 2018-02-03 LAB — CULTURE, URINE COMPREHENSIVE

## 2018-02-03 NOTE — Telephone Encounter (Signed)
Spoke to patient and informed the result is Preliminary and not final. We will call her when the final report comes in.

## 2018-02-03 NOTE — Progress Notes (Signed)
Orders in for a RUS.

## 2018-02-03 NOTE — Telephone Encounter (Signed)
Pt called for Culture results, states she was told someone would call her by Thursday for the results and hasn't heard from anyone. Please advise pt of results at [phone].

## 2018-02-04 ENCOUNTER — Telehealth: Payer: Self-pay

## 2018-02-04 NOTE — Telephone Encounter (Signed)
-----   Message from Harle BattiestShannon A McGowan, PA-C sent at 02/03/2018  4:52 PM EST ----- Please let Candice BallRobin know that her urine culture was negative for infection and I will now schedule a renal ultrasound for further evaluation.

## 2018-02-04 NOTE — Telephone Encounter (Signed)
Pt returned call

## 2018-02-04 NOTE — Telephone Encounter (Signed)
Attempted to reach pt, no answer, unable to leave a vm due to it being full

## 2018-02-07 NOTE — Telephone Encounter (Signed)
I read message from Hafa Adai Specialist [place] to pt when she called in.

## 2018-02-10 ENCOUNTER — Ambulatory Visit
Admission: RE | Admit: 2018-02-10 | Discharge: 2018-02-10 | Disposition: A | Discharge status: Home / Self Care | Payer: Commercial Managed Care - PPO | Source: Ambulatory Visit | Attending: Urology | Admitting: Urology

## 2018-02-10 DIAGNOSIS — R109 Unspecified abdominal pain: Secondary | ICD-10-CM

## 2018-02-10 DIAGNOSIS — N132 Hydronephrosis with renal and ureteral calculous obstruction: Secondary | ICD-10-CM | POA: Diagnosis present

## 2018-02-11 ENCOUNTER — Telehealth: Payer: Self-pay | Admitting: Urology

## 2018-02-11 ENCOUNTER — Other Ambulatory Visit: Payer: Self-pay | Admitting: Urology

## 2018-02-11 DIAGNOSIS — N200 Calculus of kidney: Secondary | ICD-10-CM

## 2018-02-11 MED ORDER — TAMSULOSIN HCL 0.4 MG PO CAPS: 0.4000 mg | ORAL_CAPSULE | Freq: Every day | ORAL | 3 refills | Status: DC

## 2018-02-11 NOTE — Telephone Encounter (Signed)
I don't see where Flomax is listed as an allergy specifically.  It is likely a possible allergy due to the allergy to sulfa drugs.  Would you ask patient if she has taken the Flomax in the past and if she tolerated the medication?

## 2018-02-11 NOTE — Progress Notes (Signed)
error 

## 2018-02-11 NOTE — Progress Notes (Signed)
Prescription sent in for Flomax.

## 2018-02-11 NOTE — Telephone Encounter (Signed)
Called pt informed her of the information below. Pt states that she does not need pain medication as she still has some left over from the hospital. Flomax rx was not sent as it was flagged for an allergy. KUB order placed.

## 2018-02-11 NOTE — Telephone Encounter (Signed)
Yes it is listed as a potential allergy when you go to prescribe it due to the sulfa, patient has taken it in the past with no adverse reactions. I just wanted to be sure before sending it in.

## 2018-02-11 NOTE — Telephone Encounter (Signed)
I have sent in a script for Flomax for the patient.

## 2018-02-11 NOTE — Telephone Encounter (Signed)
Please let Candice Robinson know that Dr. Richardo HanksSninsky agrees that the kidney is going to be swollen for a long time as it heals.  He recommends some Flomax and pain medications through the weekend and if she is still having discomfort, a KUB on Monday.   She will need to pick up the pain medication at the office, so I need to know so I can leave the prescription as I will be out of the office this afternoon.

## 2018-02-14 ENCOUNTER — Telehealth: Payer: Self-pay

## 2018-02-14 ENCOUNTER — Ambulatory Visit
Admission: RE | Admit: 2018-02-14 | Discharge: 2018-02-14 | Disposition: A | Discharge status: Home / Self Care | Payer: Commercial Managed Care - PPO | Source: Ambulatory Visit | Attending: Urology | Admitting: Urology

## 2018-02-14 ENCOUNTER — Other Ambulatory Visit: Payer: Commercial Managed Care - PPO

## 2018-02-14 DIAGNOSIS — N200 Calculus of kidney: Secondary | ICD-10-CM | POA: Diagnosis present

## 2018-02-14 NOTE — Telephone Encounter (Signed)
-----   Message from Harle BattiestShannon A McGowan, PA-C sent at 02/14/2018 10:28 AM EST ----- Please let Candice Robinson know that no stones were seen in her ureter on the xray.  It will just take some time to heal as she had a big stone.  She needs to keep her follow up with Dr. Richardo HanksSninsky in January.  If she should start to experience pain that is not able to be controlled with pain medication, intractable nausea and/or vomiting and/or fevers greater than 103 or shaking chills to contact the office immediately or seek treatment in the emergency department for emergent intervention.

## 2018-02-14 NOTE — Telephone Encounter (Signed)
Informed patient she has an rx for flomax.

## 2018-02-14 NOTE — Telephone Encounter (Signed)
Patient notified

## 2018-03-03 ENCOUNTER — Ambulatory Visit: Payer: Commercial Managed Care - PPO | Admitting: Urology

## 2018-03-18 ENCOUNTER — Other Ambulatory Visit: Payer: Self-pay | Admitting: Urology

## 2018-03-24 ENCOUNTER — Ambulatory Visit (INDEPENDENT_AMBULATORY_CARE_PROVIDER_SITE_OTHER): Payer: Commercial Managed Care - PPO | Admitting: Urology

## 2018-03-24 ENCOUNTER — Encounter: Payer: Self-pay | Admitting: Urology

## 2018-03-24 VITALS — BP 145/87 | HR 85 | Ht 63.0 in | Wt 157.0 lb

## 2018-03-24 DIAGNOSIS — N200 Calculus of kidney: Secondary | ICD-10-CM

## 2018-03-24 MED ORDER — GABAPENTIN 300 MG PO CAPS: 300.0000 mg | ORAL_CAPSULE | Freq: Two times a day (BID) | ORAL | 0 refills | Status: DC

## 2018-03-24 NOTE — Progress Notes (Signed)
   03/24/2018 3:02 PM   Candice Robinson 03/03/1970 [id number]  Reason for visit: Follow up nephrolithiasis  HPI: I saw [name] back in urology clinic for follow-up of nephrolithiasis.  She initially presented in September 2019 with a complete right staghorn stone.  She underwent right PCNL on 12/03/2017, and we were unable to access approximately 50% of the stone.  She had significant pain with the nephrostomy tube and in preop when the nephroureteral catheter was placed.  She refused repeat PCNL and elected for staged ureteroscopy.  She then underwent right ureteroscopy with laser lithotripsy and stent placement on 12/27/2017 and 01/07/2018.  She had a renal ultrasound 02/10/2018 that suggested moderate right hydronephrosis, and follow-up KUB on 02/14/2018 showed no calcifications over the ureter, and minimal right lower pole stone fragments.  She does feel like her discomfort and pain have improved over the last month.  She reports she continues to have intermittent dull right-sided discomfort and pain.  This is worse with physical activity and certain twisting movements.  She does not have any renal colic or sharp shooting pain.  She previously was having some difficulty with what she describes as bladder spasms with dysuria, however this is resolved.  Urine culture was negative in mid December.  She denies any fevers or chills.  Her story is not classic for renal colic or obstructive fragments, and her recent KUB is reassuring.  We discussed possibility of superficial nerve damage after PCNL, though without repeat imaging with CT cannot rule out residual fragments and hydronephrosis.  We also reviewed her 24-hour urine results.  These were notable for low urine volume of 1.8 L, and borderline elevated sodium.  Stone analysis showed calcium oxalate 60%, calcium phosphate 40%.  We discussed general stone prevention strategies including adequate hydration with goal of producing 2.5 L of urine  daily, increasing citric acid intake, increasing calcium intake during high oxalate meals, minimizing animal protein, and decreasing salt intake. Information about dietary recommendations given today.   Trial of gabapentin 300 mg twice daily x1 month Continue Flomax x1 month RTC 1 month, if persistent symptoms will obtain CT stone protocol  A total of 15 minutes were spent face-to-face with the patient, greater than 50% was spent in patient education, counseling, and coordination of care regarding pain after PCNL, nephrolithiasis, and stone prevention.   Sondra Come, MD  [facility] [address] [place], [place] [address] [phone]

## 2018-03-24 NOTE — Patient Instructions (Signed)
Dietary Guidelines to Help Prevent Kidney Stones  Kidney stones are deposits of minerals and salts that form inside your kidneys. Your risk of developing kidney stones may be greater depending on your diet, your lifestyle, the medicines you take, and whether you have certain medical conditions. Most people can reduce their chances of developing kidney stones by following the instructions below. Depending on your overall health and the type of kidney stones you tend to develop, your dietitian may give you more specific instructions.  What are tips for following this plan?  Reading food labels   Choose foods with "no salt added" or "low-salt" labels. Limit your sodium intake to less than 1500 mg per day.   Choose foods with calcium for each meal and snack. Try to eat about 300 mg of calcium at each meal. Foods that contain 200-500 mg of calcium per serving include:  ? 8 oz (237 ml) of milk, fortified nondairy milk, and fortified fruit juice.  ? 8 oz (237 ml) of kefir, yogurt, and soy yogurt.  ? 4 oz (118 ml) of tofu.  ? 1 oz of cheese.  ? 1 cup (300 g) of dried figs.  ? 1 cup (91 g) of cooked broccoli.  ? 1-3 oz can of sardines or mackerel.   Most people need 1000 to 1500 mg of calcium each day. Talk to your dietitian about how much calcium is recommended for you.  Shopping   Buy plenty of fresh fruits and vegetables. Most people do not need to avoid fruits and vegetables, even if they contain nutrients that may contribute to kidney stones.   When shopping for convenience foods, choose:  ? Whole pieces of fruit.  ? Premade salads with dressing on the side.  ? Low-fat fruit and yogurt smoothies.   Avoid buying frozen meals or prepared deli foods.   Look for foods with live cultures, such as yogurt and kefir.  Cooking   Do not add salt to food when cooking. Place a salt shaker on the table and allow each person to add his or her own salt to taste.   Use vegetable protein, such as beans, textured vegetable  protein (TVP), or tofu instead of meat in pasta, casseroles, and soups.  Meal planning     Eat less salt, if told by your dietitian. To do this:  ? Avoid eating processed or premade food.  ? Avoid eating fast food.   Eat less animal protein, including cheese, meat, poultry, or fish, if told by your dietitian. To do this:  ? Limit the number of times you have meat, poultry, fish, or cheese each week. Eat a diet free of meat at least 2 days a week.  ? Eat only one serving each day of meat, poultry, fish, or seafood.  ? When you prepare animal protein, cut pieces into small portion sizes. For most meat and fish, one serving is about the size of one deck of cards.   Eat at least 5 servings of fresh fruits and vegetables each day. To do this:  ? Keep fruits and vegetables on hand for snacks.  ? Eat 1 piece of fruit or a handful of berries with breakfast.  ? Have a salad and fruit at lunch.  ? Have two kinds of vegetables at dinner.   Limit foods that are high in a substance called oxalate. These include:  ? Spinach.  ? Rhubarb.  ? Beets.  ? Potato chips and french fries.  ? Nuts.     If you regularly take a diuretic medicine, make sure to eat at least 1-2 fruits or vegetables high in potassium each day. These include:  ? Avocado.  ? Banana.  ? Orange, prune, carrot, or tomato juice.  ? Baked potato.  ? Cabbage.  ? Beans and split peas.  General instructions     Drink enough fluid to keep your urine clear or pale yellow. This is the most important thing you can do.   Talk to your health care provider and dietitian about taking daily supplements. Depending on your health and the cause of your kidney stones, you may be advised:  ? Not to take supplements with vitamin C.  ? To take a calcium supplement.  ? To take a daily probiotic supplement.  ? To take other supplements such as magnesium, fish oil, or vitamin B6.   Take all medicines and supplements as told by your health care provider.   Limit alcohol intake to no  more than 1 drink a day for nonpregnant women and 2 drinks a day for men. One drink equals 12 oz of beer, 5 oz of wine, or 1 oz of hard liquor.   Lose weight if told by your health care provider. Work with your dietitian to find strategies and an eating plan that works best for you.  What foods are not recommended?  Limit your intake of the following foods, or as told by your dietitian. Talk to your dietitian about specific foods you should avoid based on the type of kidney stones and your overall health.  Grains  Breads. Bagels. Rolls. Baked goods. Salted crackers. Cereal. Pasta.  Vegetables  Spinach. Rhubarb. Beets. Canned vegetables. Pickles. Olives.  Meats and other protein foods  Nuts. Nut butters. Large portions of meat, poultry, or fish. Salted or cured meats. Deli meats. Hot dogs. Sausages.  Dairy  Cheese.  Beverages  Regular soft drinks. Regular vegetable juice.  Seasonings and other foods  Seasoning blends with salt. Salad dressings. Canned soups. Soy sauce. Ketchup. Barbecue sauce. Canned pasta sauce. Casseroles. Pizza. Lasagna. Frozen meals. Potato chips. French fries.  Summary   You can reduce your risk of kidney stones by making changes to your diet.   The most important thing you can do is drink enough fluid. You should drink enough fluid to keep your urine clear or pale yellow.   Ask your health care provider or dietitian how much protein from animal sources you should eat each day, and also how much salt and calcium you should have each day.  This information is not intended to replace advice given to you by your health care provider. Make sure you discuss any questions you have with your health care provider.  Document Released: 06/13/2010 Document Revised: 01/28/2016 Document Reviewed: 01/28/2016  Elsevier Interactive Patient Education  2019 [employer]vier Inc[employer]

## 2018-04-26 ENCOUNTER — Ambulatory Visit: Payer: Commercial Managed Care - PPO | Admitting: Urology

## 2018-04-26 VITALS — BP 155/77 | HR 90 | Ht 63.0 in

## 2018-04-26 DIAGNOSIS — Z87442 Personal history of urinary calculi: Secondary | ICD-10-CM

## 2018-04-26 NOTE — Patient Instructions (Signed)
Dietary Guidelines to Help Prevent Kidney Stones  Kidney stones are deposits of minerals and salts that form inside your kidneys. Your risk of developing kidney stones may be greater depending on your diet, your lifestyle, the medicines you take, and whether you have certain medical conditions. Most people can reduce their chances of developing kidney stones by following the instructions below. Depending on your overall health and the type of kidney stones you tend to develop, your dietitian may give you more specific instructions.  What are tips for following this plan?  Reading food labels   Choose foods with "no salt added" or "low-salt" labels. Limit your sodium intake to less than 1500 mg per day.   Choose foods with calcium for each meal and snack. Try to eat about 300 mg of calcium at each meal. Foods that contain 200-500 mg of calcium per serving include:  ? 8 oz (237 ml) of milk, fortified nondairy milk, and fortified fruit juice.  ? 8 oz (237 ml) of kefir, yogurt, and soy yogurt.  ? 4 oz (118 ml) of tofu.  ? 1 oz of cheese.  ? 1 cup (300 g) of dried figs.  ? 1 cup (91 g) of cooked broccoli.  ? 1-3 oz can of sardines or mackerel.   Most people need 1000 to 1500 mg of calcium each day. Talk to your dietitian about how much calcium is recommended for you.  Shopping   Buy plenty of fresh fruits and vegetables. Most people do not need to avoid fruits and vegetables, even if they contain nutrients that may contribute to kidney stones.   When shopping for convenience foods, choose:  ? Whole pieces of fruit.  ? Premade salads with dressing on the side.  ? Low-fat fruit and yogurt smoothies.   Avoid buying frozen meals or prepared deli foods.   Look for foods with live cultures, such as yogurt and kefir.  Cooking   Do not add salt to food when cooking. Place a salt shaker on the table and allow each person to add his or her own salt to taste.   Use vegetable protein, such as beans, textured vegetable  protein (TVP), or tofu instead of meat in pasta, casseroles, and soups.  Meal planning     Eat less salt, if told by your dietitian. To do this:  ? Avoid eating processed or premade food.  ? Avoid eating fast food.   Eat less animal protein, including cheese, meat, poultry, or fish, if told by your dietitian. To do this:  ? Limit the number of times you have meat, poultry, fish, or cheese each week. Eat a diet free of meat at least 2 days a week.  ? Eat only one serving each day of meat, poultry, fish, or seafood.  ? When you prepare animal protein, cut pieces into small portion sizes. For most meat and fish, one serving is about the size of one deck of cards.   Eat at least 5 servings of fresh fruits and vegetables each day. To do this:  ? Keep fruits and vegetables on hand for snacks.  ? Eat 1 piece of fruit or a handful of berries with breakfast.  ? Have a salad and fruit at lunch.  ? Have two kinds of vegetables at dinner.   Limit foods that are high in a substance called oxalate. These include:  ? Spinach.  ? Rhubarb.  ? Beets.  ? Potato chips and french fries.  ? Nuts.     If you regularly take a diuretic medicine, make sure to eat at least 1-2 fruits or vegetables high in potassium each day. These include:  ? Avocado.  ? Banana.  ? Orange, prune, carrot, or tomato juice.  ? Baked potato.  ? Cabbage.  ? Beans and split peas.  General instructions     Drink enough fluid to keep your urine clear or pale yellow. This is the most important thing you can do.   Talk to your health care provider and dietitian about taking daily supplements. Depending on your health and the cause of your kidney stones, you may be advised:  ? Not to take supplements with vitamin C.  ? To take a calcium supplement.  ? To take a daily probiotic supplement.  ? To take other supplements such as magnesium, fish oil, or vitamin B6.   Take all medicines and supplements as told by your health care provider.   Limit alcohol intake to no  more than 1 drink a day for nonpregnant women and 2 drinks a day for men. One drink equals 12 oz of beer, 5 oz of wine, or 1 oz of hard liquor.   Lose weight if told by your health care provider. Work with your dietitian to find strategies and an eating plan that works best for you.  What foods are not recommended?  Limit your intake of the following foods, or as told by your dietitian. Talk to your dietitian about specific foods you should avoid based on the type of kidney stones and your overall health.  Grains  Breads. Bagels. Rolls. Baked goods. Salted crackers. Cereal. Pasta.  Vegetables  Spinach. Rhubarb. Beets. Canned vegetables. Pickles. Olives.  Meats and other protein foods  Nuts. Nut butters. Large portions of meat, poultry, or fish. Salted or cured meats. Deli meats. Hot dogs. Sausages.  Dairy  Cheese.  Beverages  Regular soft drinks. Regular vegetable juice.  Seasonings and other foods  Seasoning blends with salt. Salad dressings. Canned soups. Soy sauce. Ketchup. Barbecue sauce. Canned pasta sauce. Casseroles. Pizza. Lasagna. Frozen meals. Potato chips. French fries.  Summary   You can reduce your risk of kidney stones by making changes to your diet.   The most important thing you can do is drink enough fluid. You should drink enough fluid to keep your urine clear or pale yellow.   Ask your health care provider or dietitian how much protein from animal sources you should eat each day, and also how much salt and calcium you should have each day.  This information is not intended to replace advice given to you by your health care provider. Make sure you discuss any questions you have with your health care provider.  Document Released: 06/13/2010 Document Revised: 01/28/2016 Document Reviewed: 01/28/2016  Elsevier Interactive Patient Education  2019 [employer]vier Inc[employer]

## 2018-04-26 NOTE — Progress Notes (Signed)
   04/26/2018 5:00 PM   Candice Robinson [birth year] [id number]  Reason for visit: Follow up right PCNL  HPI: I saw [name] back in urology clinic today after right PCNL.  To briefly summarize, she is a healthy 48 year old female who originally underwent right PCNL on 12/03/2017 for a complete right staghorn stone.  Access was very challenging and only 50% of her stone was able to be accessed and treated.  She elected for staged ureteroscopy for follow-up for management of her residual stone burden, and underwent uncomplicated ureteroscopy on 10/28 and 11/8.  She had a follow-up KUB on 02/14/2018 that showed no significant residual fragments.  She has had ongoing right-sided discomfort and intermittent pain over the last few months.  It is worse with physical activity and twisting movements.  The renal colic or sharp shooting pain.  Urine cultures have been negative.  At her last visit, we had discussed a trial of gabapentin, however she never started this medication secondary to possible side effects.  She reports that overall, her pain has continued to improve and is well controlled currently with as needed ibuprofen.   We discussed options for management including ongoing observation versus further investigation with a CT scan.  We did discuss that she has had multiple CT scans over the last few months, and there is cost and radiation associated with these imaging tests.  She elected for observation at this time, which I feel is very reasonable.  -Stone prevention strategies discussed at length again today -RTC 6 weeks with KUB -Consider CT stone protocol if worsening right-sided pain  A total of 15 minutes were spent face-to-face with the patient, greater than 50% was spent in patient education, counseling, and coordination of care regarding PCNL and postoperative expectations, and stone prevention.   Sondra Come, MD  [facility] [address] [place], [place] [address] [phone]

## 2018-08-17 ENCOUNTER — Encounter: Payer: Commercial Managed Care - PPO | Admitting: Family Medicine

## 2018-08-28 IMAGING — MG MM DIGITAL DIAGNOSTIC BILAT W/ TOMO W/ CAD
9 of 12 series · 9 of 28 positions shown · non-contrast
Comparison: Prior mammograms with magnification views dating back
to 09/24/2016

CLINICAL DATA: 47-year-old female for follow-up of RIGHT breast
calcifications and for annual bilateral mammograms.

EXAM:
DIGITAL DIAGNOSTIC BILATERAL MAMMOGRAM WITH CAD AND TOMO

[R CC (1 of 2)]
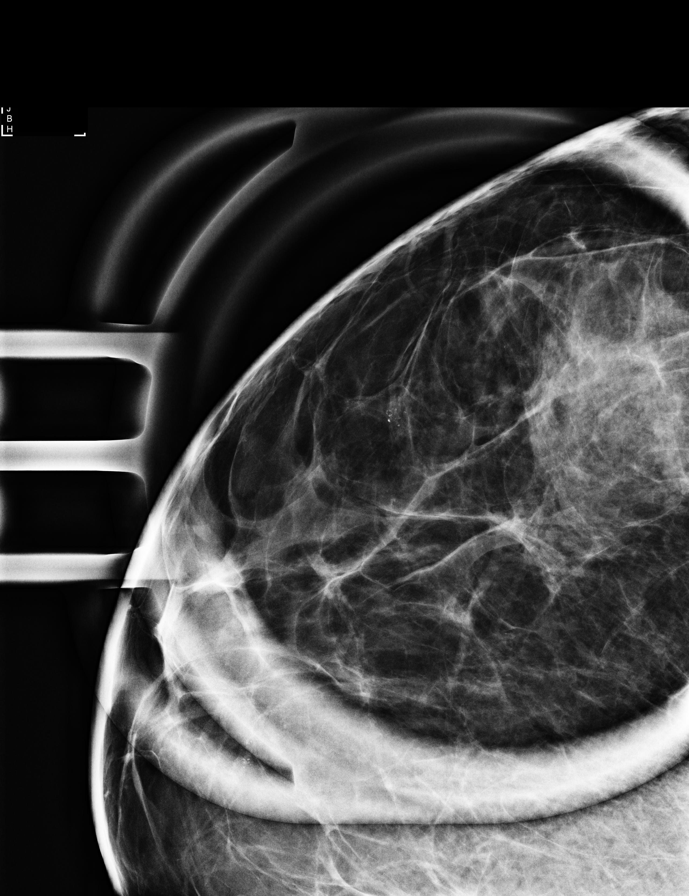

[R ML (1 of 2)]
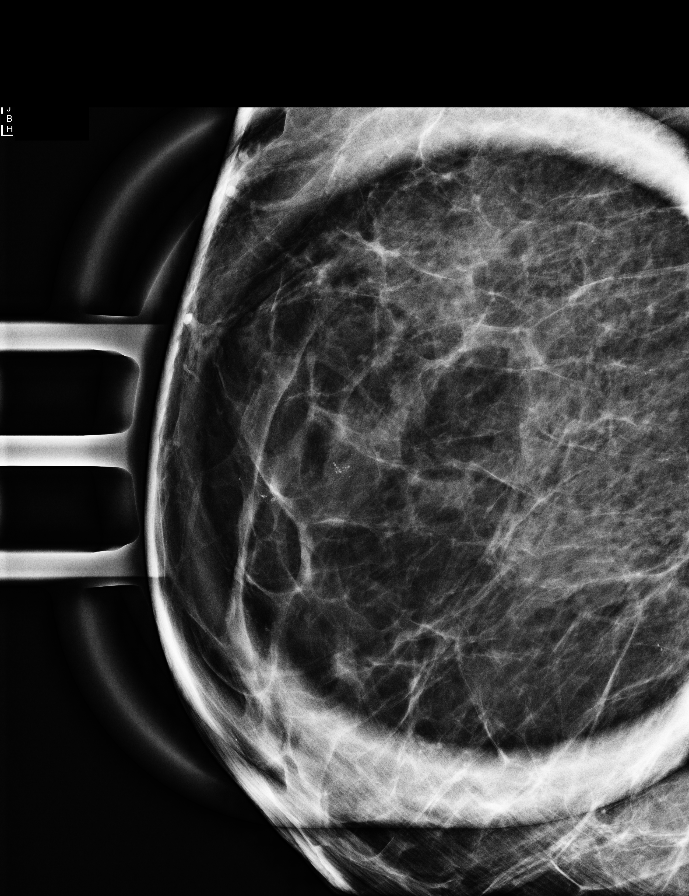

[L MLO synth-2D]
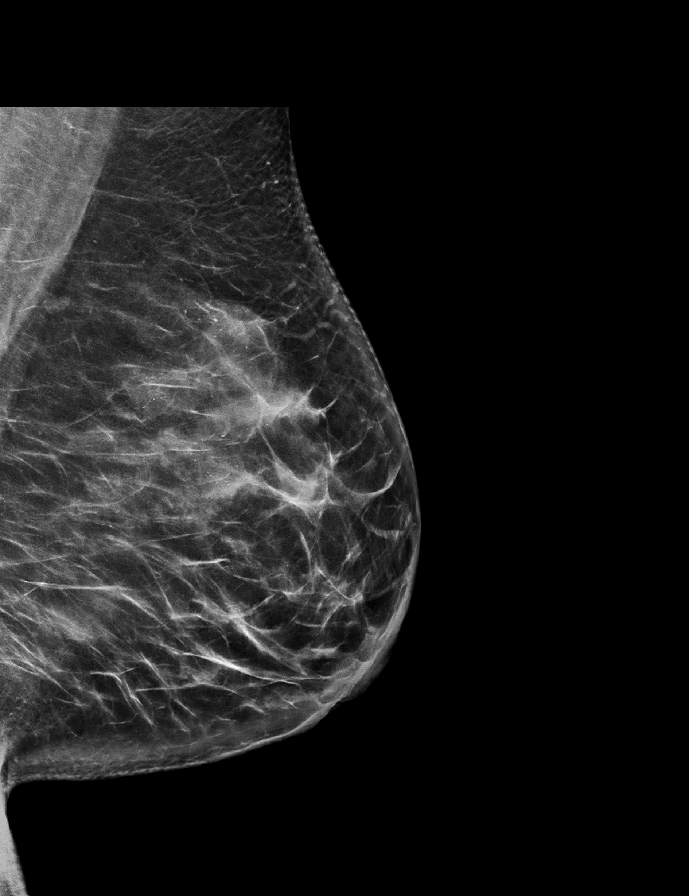

[L CC synth-2D]
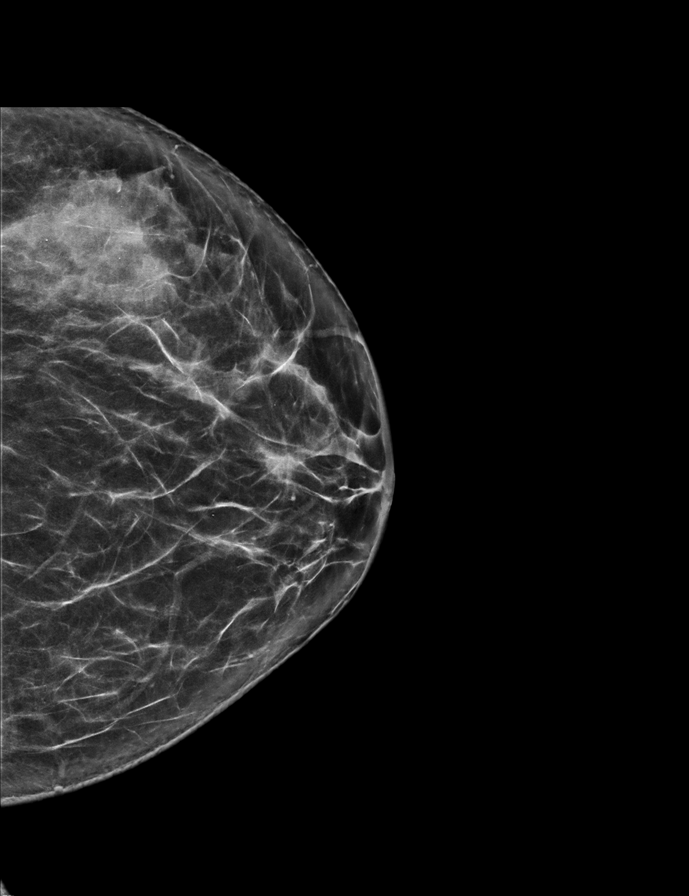

[R CC synth-2D]
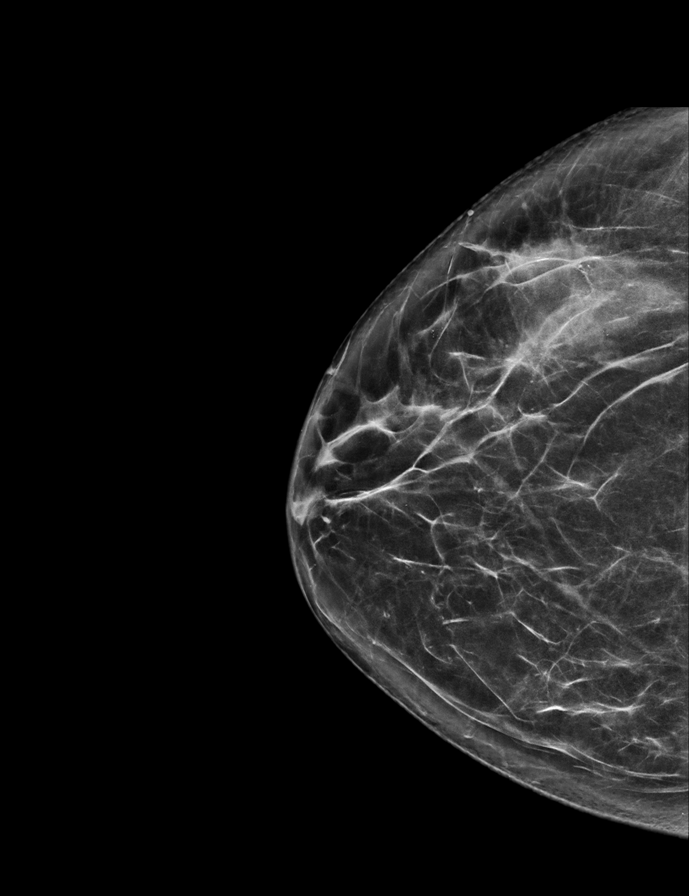

[R MLO synth-2D]
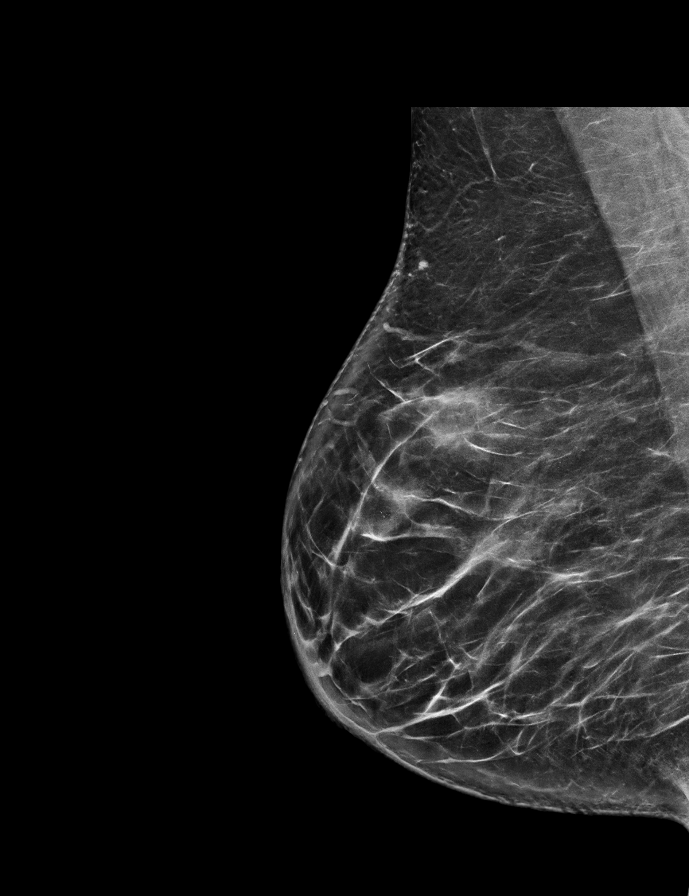

[R MLO tomo · tomo slice 39/78.0]
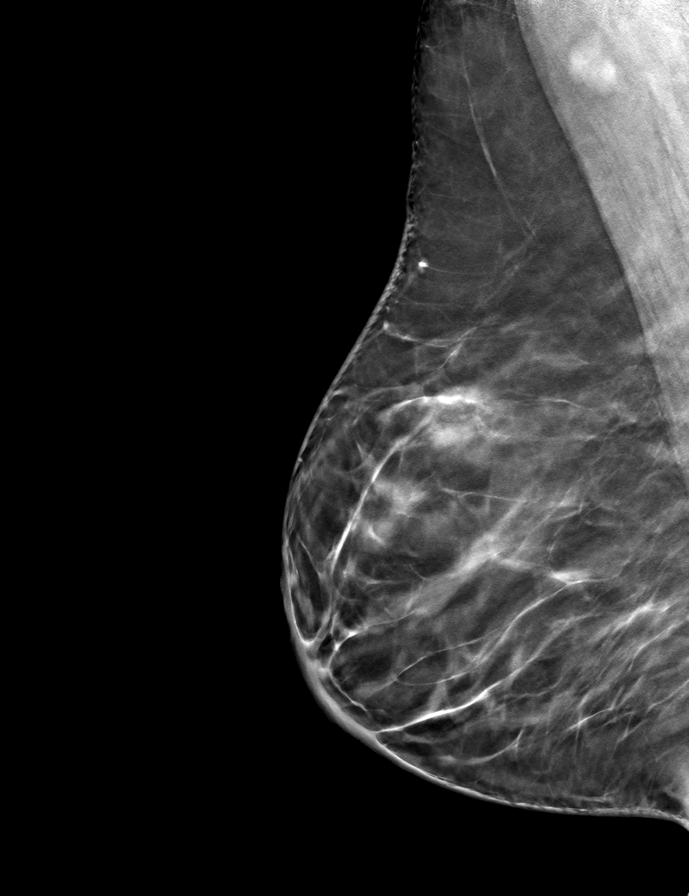

[R ML (2 of 2)]
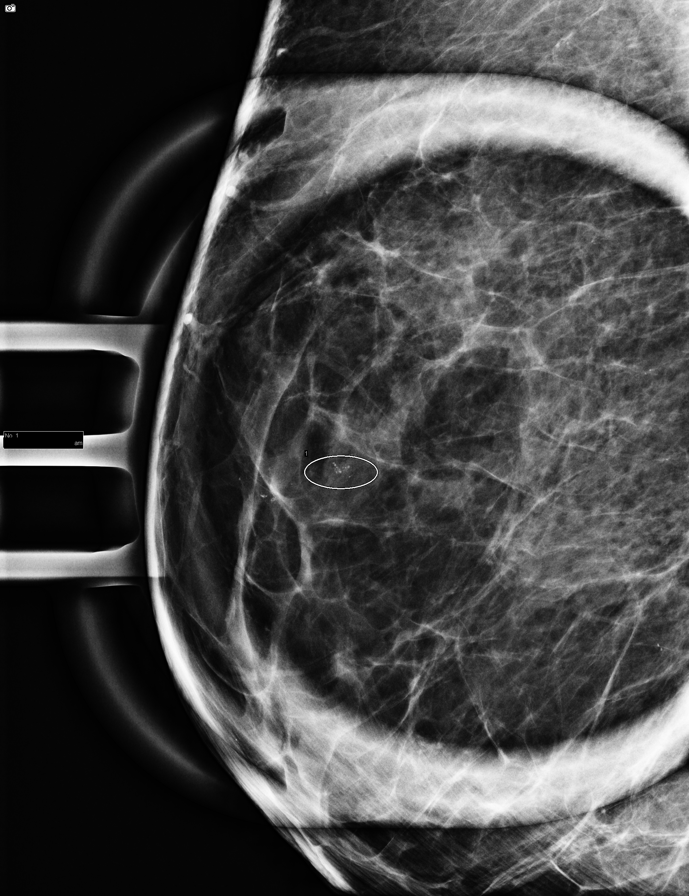

[R CC (2 of 2)]
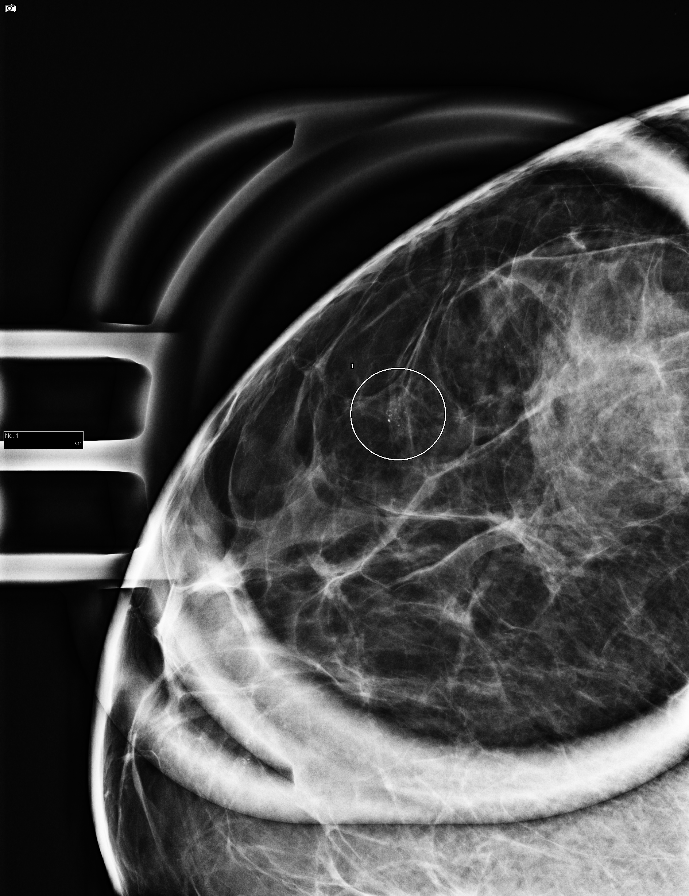

[9 of 28 positions shown; findings below may reference images not displayed]

ACR Breast Density Category b: There are scattered areas of
fibroglandular density.
FINDINGS: 2D/3D full field views of both breasts and magnification views of
the RIGHT breast again demonstrate a 3 mm group of benign appearing
calcifications within the UPPER-OUTER RIGHT breast again noted
without suspicious forms.

No suspicious findings bilaterally identified.

Mammographic images were processed with CAD.
IMPRESSION: Likely benign UPPER OUTER RIGHT breast calcifications again
identified. Twelve month follow-up is recommended to ensure 2 year
stability.

RECOMMENDATION:
Bilateral diagnostic mammograms with magnification views of the
RIGHT breast in 12 months

I have discussed the findings and recommendations with the patient.
Results were also provided in writing at the conclusion of the
visit. If applicable, a reminder letter will be sent to the patient
regarding the next appointment.

BI-RADS CATEGORY  3: Probably benign.

## 2018-10-25 ENCOUNTER — Other Ambulatory Visit: Payer: Self-pay

## 2018-10-25 DIAGNOSIS — Z20822 Contact with and (suspected) exposure to covid-19: Secondary | ICD-10-CM

## 2018-10-26 LAB — NOVEL CORONAVIRUS, NAA: SARS-CoV-2, NAA: NOT DETECTED

## 2018-10-27 ENCOUNTER — Ambulatory Visit: Payer: Commercial Managed Care - PPO | Admitting: Urology

## 2018-10-31 ENCOUNTER — Telehealth: Payer: Self-pay | Admitting: *Deleted

## 2018-10-31 NOTE — Telephone Encounter (Signed)
Pt notified of results

## 2018-11-09 ENCOUNTER — Ambulatory Visit: Payer: Commercial Managed Care - PPO | Admitting: Urology

## 2018-11-14 IMAGING — DX DG CHEST 1V PORT
1 series · 1 of 1 positions shown · non-contrast
Comparison: Chest radiograph 12/03/2017

CLINICAL DATA: Patient with sepsis.

EXAM:
PORTABLE CHEST 1 VIEW

[chest ap]
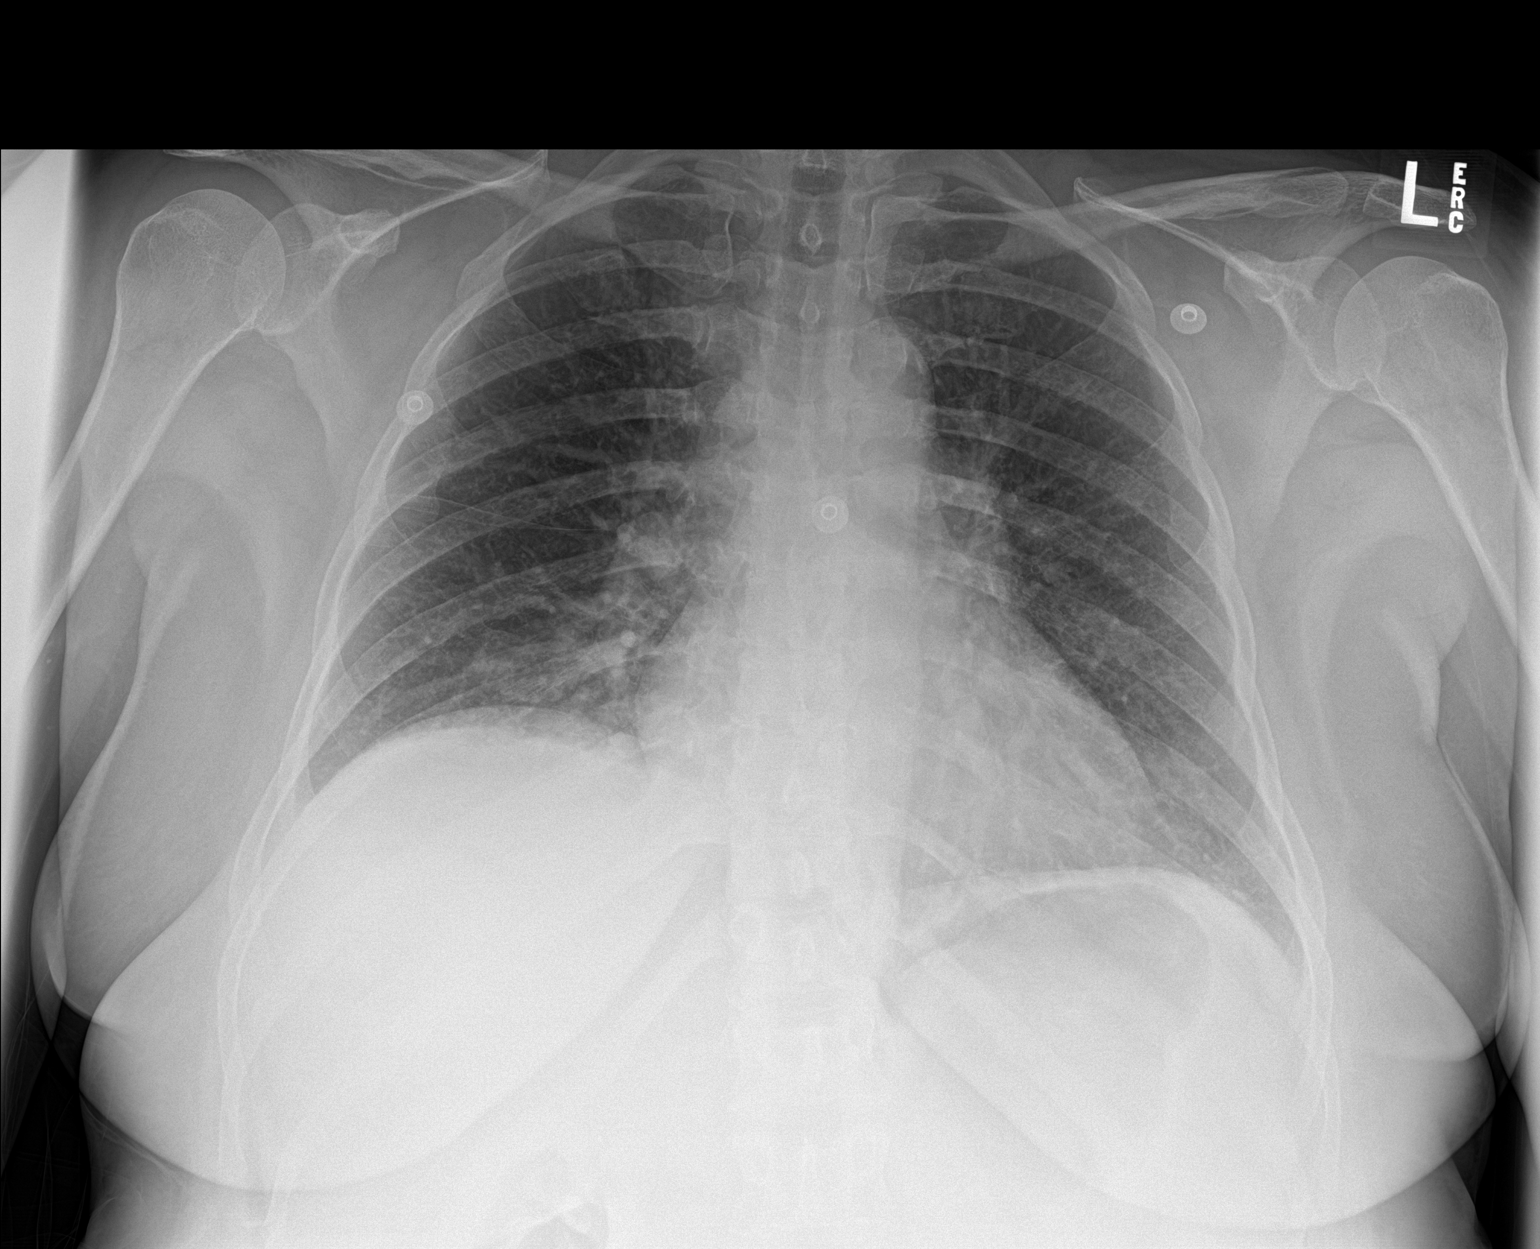

[1 of 1 positions shown; findings below may reference images not displayed]

FINDINGS: Stable cardiac and mediastinal contours. No consolidative pulmonary
opacities. No pleural effusion or pneumothorax.
IMPRESSION: No acute cardiopulmonary process.

## 2018-11-16 ENCOUNTER — Ambulatory Visit: Payer: Commercial Managed Care - PPO | Admitting: Urology

## 2018-11-16 ENCOUNTER — Other Ambulatory Visit: Payer: Self-pay

## 2018-11-16 ENCOUNTER — Ambulatory Visit
Admission: RE | Admit: 2018-11-16 | Discharge: 2018-11-16 | Disposition: A | Discharge status: Home / Self Care | Payer: Commercial Managed Care - PPO | Source: Ambulatory Visit | Attending: Urology | Admitting: Urology

## 2018-11-16 ENCOUNTER — Encounter: Payer: Self-pay | Admitting: Urology

## 2018-11-16 VITALS — BP 142/83 | HR 71 | Ht 63.0 in | Wt 171.0 lb

## 2018-11-16 DIAGNOSIS — N200 Calculus of kidney: Secondary | ICD-10-CM | POA: Diagnosis not present

## 2018-11-16 DIAGNOSIS — R109 Unspecified abdominal pain: Secondary | ICD-10-CM

## 2018-11-16 NOTE — Patient Instructions (Signed)

## 2018-11-16 NOTE — Progress Notes (Signed)
   11/16/2018 9:56 AM   Candice Robinson [birth year] [id number]  Reason for visit: Follow up right PCNL  HPI: I saw [name] back in urology clinic today after undergoing treatment for right complete staghorn stone.  To briefly summarize, she is a healthy 48 year old female who originally underwent right PCNL on 12/03/2017 for a complete right staghorn stone.  Access was very challenging and only 50% of her stone was able to be accessed and treated.  She elected for staged ureteroscopy for follow-up for management of her residual stone burden, and underwent uncomplicated ureteroscopy on 10/28 and 11/8.  She had a follow-up KUB on 02/14/2018 that showed no significant residual fragments.  She has had ongoing low right-sided back pain intermittently over the last 9 months.  It is worse with physical activity, walking, and twisting movements.  She uses ibuprofen occasionally as needed for this.  She denies any urinary symptoms or gross hematuria.  She denies any groin or upper flank pain.  On exam, she points to the very low right side of the back just above the right hip where her discomfort is.  It is non-tender to palpation and there are no skin lesions.  Her PCNL incision site is well-healed and is 8 to 10 inches above the site she identifies as painful.  I personally reviewed her KUB today, and there is no obvious nephrolithiasis.  I recommended further evaluation with a renal ultrasound today.  We discussed with her right complete staghorn stone there may be some degree of chronic permanent dilation of the right kidney.  However with her symptoms, I recommended pursuing a noncontrast CT if she does have hydronephrosis on an ultrasound.  However, her symptoms and exam are much more consistent with chronic low back pain of musculoskeletal nature, and if imaging is negative she may benefit more from physical therapy, lidocaine patch, ice/heat, NSAIDs, and a TENS unit  -Stone prevention  strategies discussed again today -Renal ultrasound and BMP, call with results.  Pursue CT stone protocol if renal ultrasound abnormal.  A total of 15 minutes were spent face-to-face with the patient, greater than 50% was spent in patient education, counseling, and coordination of care regarding PCNL and postoperative expectations, low back pain, and stone prevention.   Billey Co, [facility] [address], [place], [place] [address] [phone]

## 2018-11-17 LAB — BASIC METABOLIC PANEL
BUN/Creatinine Ratio: 12 (ref 9–23)
BUN: 12 mg/dL (ref 6–24)
CO2: 23 mmol/L (ref 20–29)
Calcium: 9.5 mg/dL (ref 8.7–10.2)
Chloride: 103 mmol/L (ref 96–106)
Creatinine, Ser: 0.98 mg/dL (ref 0.57–1.00)
GFR calc Af Amer: 79 mL/min/{1.73_m2} (ref >59)
GFR calc non Af Amer: 68 mL/min/{1.73_m2} (ref >59)
Glucose: 89 mg/dL (ref 65–99)
Potassium: 4.1 mmol/L (ref 3.5–5.2)
Sodium: 139 mmol/L (ref 134–144)

## 2018-12-01 ENCOUNTER — Other Ambulatory Visit: Payer: Self-pay

## 2018-12-01 ENCOUNTER — Ambulatory Visit
Admission: RE | Admit: 2018-12-01 | Discharge: 2018-12-01 | Disposition: A | Discharge status: Home / Self Care | Payer: Commercial Managed Care - PPO | Source: Ambulatory Visit | Attending: Urology | Admitting: Urology

## 2018-12-01 DIAGNOSIS — N200 Calculus of kidney: Secondary | ICD-10-CM | POA: Insufficient documentation

## 2018-12-02 ENCOUNTER — Telehealth: Payer: Self-pay

## 2018-12-02 DIAGNOSIS — N1330 Unspecified hydronephrosis: Secondary | ICD-10-CM

## 2018-12-02 NOTE — Telephone Encounter (Signed)
-----   Message from Billey Co, MD sent at 12/01/2018  5:05 PM EDT ----- Renal ultrasound shows some swelling(hydronephrosis) still on that right side. This could be chronic from the staghorn stone she had previously, from scarring from surgery, or from a stone down low(however KUB was negative last week).  Please order stat CT stone protocol to figure out why right kidney still having swelling, and I will call her with those results. Thanks  Nickolas Madrid, MD 12/01/2018

## 2018-12-02 NOTE — Telephone Encounter (Signed)
Called pt informed her of the information below. Pt gave verbal understanding. CT stat order placed.

## 2018-12-06 ENCOUNTER — Ambulatory Visit
Admission: RE | Admit: 2018-12-06 | Discharge: 2018-12-06 | Disposition: A | Discharge status: Home / Self Care | Payer: Commercial Managed Care - PPO | Source: Ambulatory Visit | Attending: Urology | Admitting: Urology

## 2018-12-06 ENCOUNTER — Telehealth: Payer: Self-pay | Admitting: Urology

## 2018-12-06 ENCOUNTER — Other Ambulatory Visit: Payer: Self-pay | Admitting: Family Medicine

## 2018-12-06 ENCOUNTER — Other Ambulatory Visit: Payer: Self-pay

## 2018-12-06 DIAGNOSIS — N1330 Unspecified hydronephrosis: Secondary | ICD-10-CM

## 2018-12-06 NOTE — Telephone Encounter (Signed)
UROLOGY TELEPHONE NOTE  I had a long phone conversation with Candice Robinson today regarding her recent CT findings on 12/06/2018 showing severe right hydroureteronephrosis down to the bladder, but no significant stone disease.  To briefly summarize her story, she is a 48 year old female who presented in September 2019 with right flank pain, hematuria, recurrent UTIs, and on CT was found to have a full right staghorn stone.  There was also moderate dilation of the right ureter down to the right iliacs on that initial scan.  Her baseline creatinine is 0.94, eGFR greater than 60.  She underwent right PCNL on 12/03/2017 with incomplete treatment of her stone.  Access was very challenging, and she did not tolerate her nephrostomy tube well.  She ultimately elected for follow-up staged ureteroscopy(with sheath access) x2 on 12/27/2017 and 01/07/2018 with complete clearance of her right-sided stone burden.  She underwent follow-up imaging in December 2019 with renal ultrasound that showed moderate right hydronephrosis and KUB that showed no stone fragments.  Her flank pain had improved from surgery.  I saw her again in February 2020 and her pain had almost completely resolved at that visit.  We discussed repeating a CT, but she felt her right-sided pain had almost completely resolved and she deferred CT at that time.  She was scheduled to follow-up 6 weeks later, but this visit was canceled secondary to the COVID-19 pandemic.  She ultimately followed up again in September 2020 and reported some ongoing intermittent right-sided flank pain.  A renal ultrasound was performed and showed persistent moderate right hydronephrosis, and follow-up CT stone protocol showed moderate to severe right hydroureteronephrosis down to the bladder.  The ureteral dilation was increased compared to her CT at original presentation.  There was a possible punctate 1 mm distal ureteral stone, however the ureter was dilated below this, and there  was no other nephrolithiasis.  There were some scant parenchymal lower pole calcifications unchanged from prior.  Renal function remains unchanged with creatinine 0.98, eGFR greater than 60.   I had a long conversation with her about these findings.  Possibilities would include poor drainage of the right kidney at baseline which could explain her right-sided staghorn stone, or scarring/stricture after her multiple stone surgeries.  I recommended referral to [facility] for consideration of possible diagnostic ureteroscopy/retrograde pyelogram versus NM renal scan versus potential right ureteral reimplant.  We discussed observation may not be the best option with her young age and symptoms of intermittent flank pain.  Referral placed to Dr. Thurmond Butts at [facility]  RTC 6 months for symptom check and to ensure not lost to follow-up  Nickolas Madrid, MD 12/06/2018

## 2018-12-09 IMAGING — CR DG ABDOMEN 1V
1 series · 1 of 1 positions shown · non-contrast
Comparison: CT scan of December 10, 2017. Radiograph December 04, 2017.

CLINICAL DATA: Nephrolithiasis.

EXAM:
ABDOMEN - 1 VIEW

[dg abd 1 view]
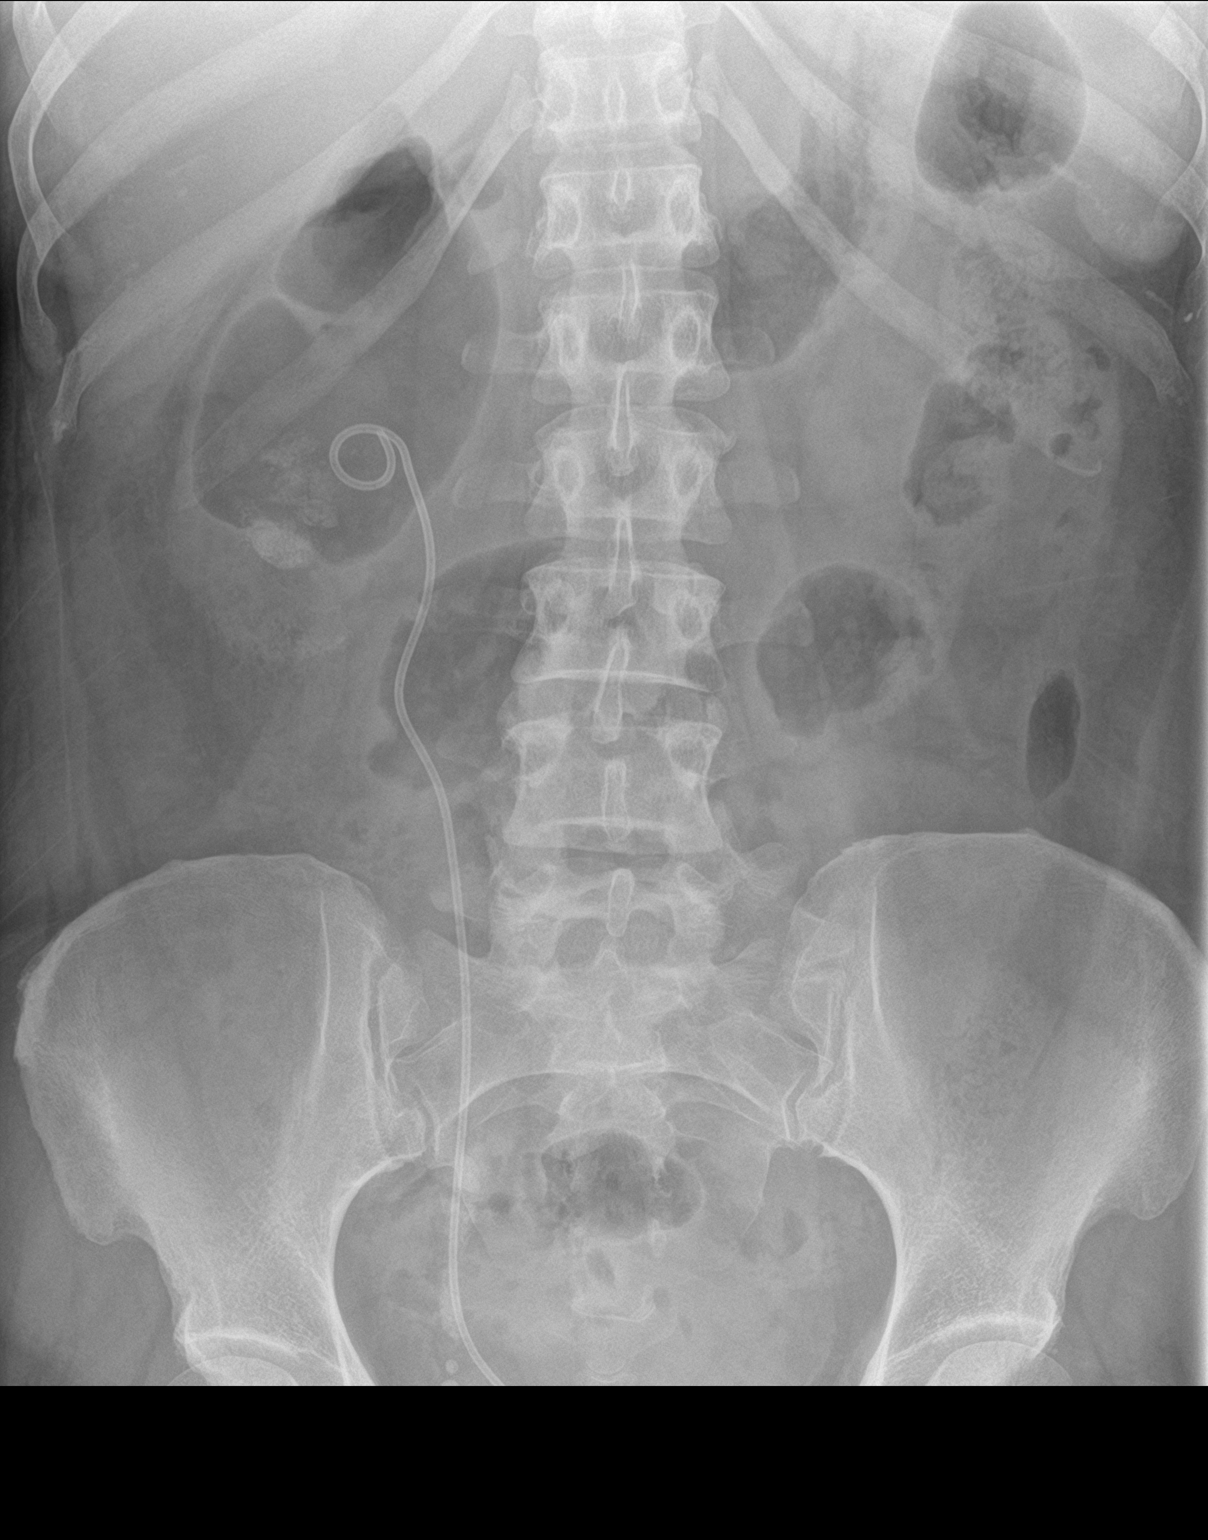

[1 of 1 positions shown; findings below may reference images not displayed]

FINDINGS: The bowel gas pattern is normal. Right-sided ureteral stent is noted
in grossly good position. Stable large staghorn type calculi seen
over lower pole of right kidney. Stable calcifications are seen
adjacent to the distal right ureteral stent as described on prior CT
scan. Phleboliths are noted in the pelvis..
IMPRESSION: Stable position of right ureteral stent. Stable staghorn calculi
seen in right kidney as well as calcifications adjacent to distal
right ureteral stent.

## 2018-12-27 ENCOUNTER — Other Ambulatory Visit: Payer: Self-pay | Admitting: *Deleted

## 2018-12-27 DIAGNOSIS — N200 Calculus of kidney: Secondary | ICD-10-CM

## 2018-12-27 DIAGNOSIS — N3289 Other specified disorders of bladder: Secondary | ICD-10-CM

## 2018-12-27 MED ORDER — OXYBUTYNIN CHLORIDE ER 10 MG PO TB24: 10.0000 mg | ORAL_TABLET | Freq: Every day | ORAL | 3 refills | Status: DC | PRN

## 2018-12-27 NOTE — Telephone Encounter (Signed)
Lets try oxybutynin 10mg  XL daily PRN x30 TABS(3 refills) for bladder spasms, if this isnt helping she can call back and we can send in a narcotic too if needed  Nickolas Madrid, MD 12/27/2018

## 2018-12-27 NOTE — Telephone Encounter (Signed)
Sent in Rx oxybutynin 10mg  3 refills

## 2019-03-18 ENCOUNTER — Other Ambulatory Visit: Payer: Self-pay | Admitting: Urology

## 2019-03-18 DIAGNOSIS — N3289 Other specified disorders of bladder: Secondary | ICD-10-CM

## 2019-03-20 NOTE — Telephone Encounter (Signed)
Looks like [facility] did surgery last week, should we refill or have [facility] fill?

## 2019-05-24 MED ORDER — DSS 100 MG PO CAPS
100.00 | ORAL_CAPSULE | ORAL | Status: DC
Start: 2019-05-25 — End: 2019-05-24

## 2019-05-24 MED ORDER — LIDOCAINE 5 % EX PTCH
1.00 | MEDICATED_PATCH | CUTANEOUS | Status: DC
Start: 2019-05-25 — End: 2019-05-24

## 2019-05-24 MED ORDER — ONDANSETRON 4 MG PO TBDP
4.00 | ORAL_TABLET | ORAL | Status: DC
Start: ? — End: 2019-05-24

## 2019-05-24 MED ORDER — KETOROLAC TROMETHAMINE 30 MG/ML IJ SOLN
15.00 | INTRAMUSCULAR | Status: DC
Start: 2019-05-24 — End: 2019-05-24

## 2019-05-24 MED ORDER — LACTATED RINGERS IV SOLN
10.00 | INTRAVENOUS | Status: DC
Start: ? — End: 2019-05-24

## 2019-05-24 MED ORDER — GENERIC EXTERNAL MEDICATION
1.00 | Status: DC
Start: 2019-05-25 — End: 2019-05-24

## 2019-05-24 MED ORDER — SENNOSIDES 8.6 MG PO TABS
2.00 | ORAL_TABLET | ORAL | Status: DC
Start: 2019-05-24 — End: 2019-05-24

## 2019-05-24 MED ORDER — ACETAMINOPHEN 500 MG PO TABS
1000.00 | ORAL_TABLET | ORAL | Status: DC
Start: 2019-05-24 — End: 2019-05-24

## 2019-11-05 IMAGING — US US RENAL
1 series · 14 of 25 positions shown · non-contrast
Comparison: February 10, 2018

CLINICAL DATA: Evaluate for hydronephrosis. History of right
staghorn calculus.

EXAM:
RENAL / URINARY TRACT ULTRASOUND COMPLETE

[Series 1: us renal · 0.26mm/px · 14 of 58 slices shown]
[im 1/58]
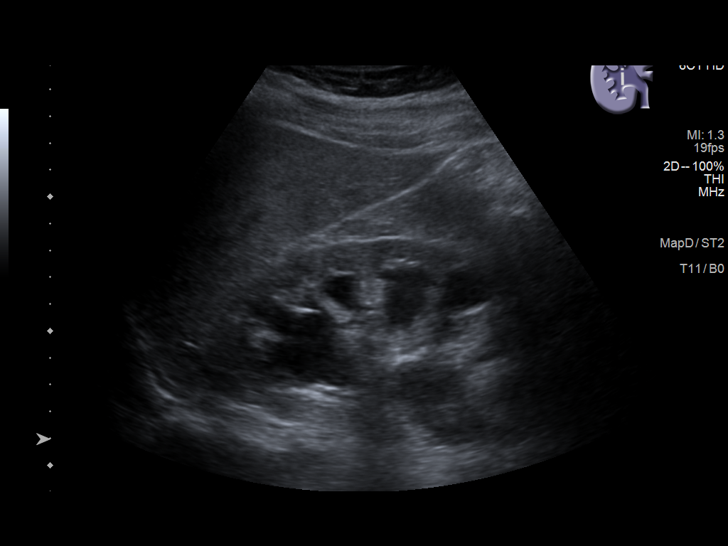
[im 5/58]
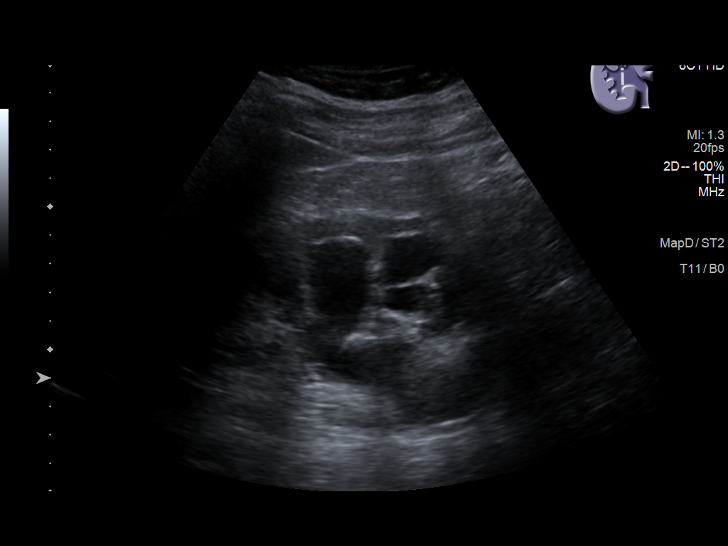
[im 10/58]
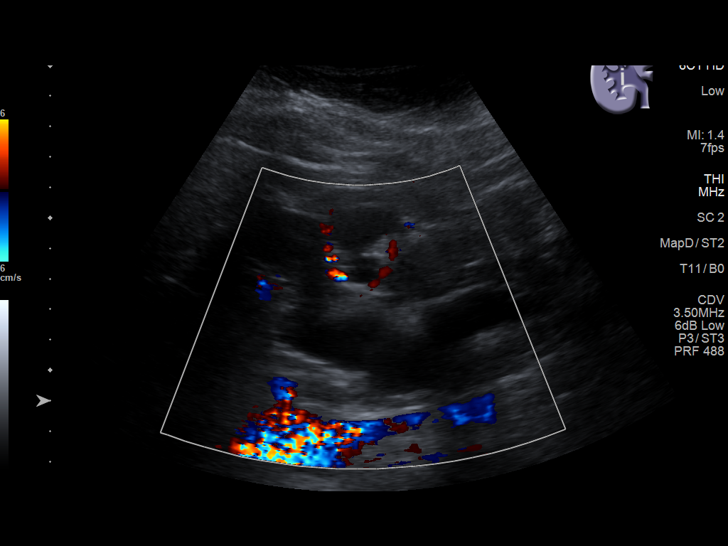
[im 15/58]
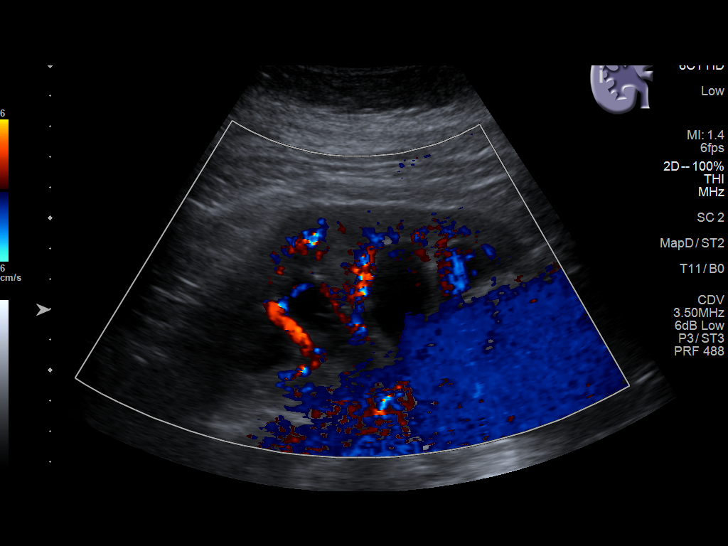
[im 20/58]
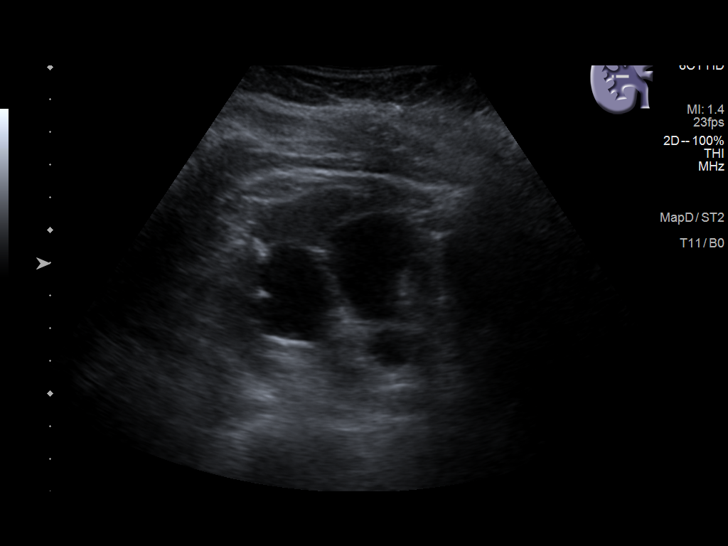
[im 22/58]
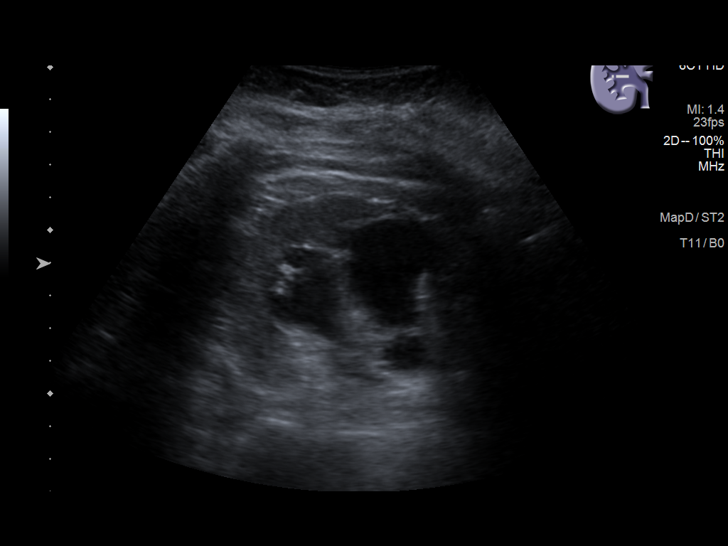
[im 27/58]
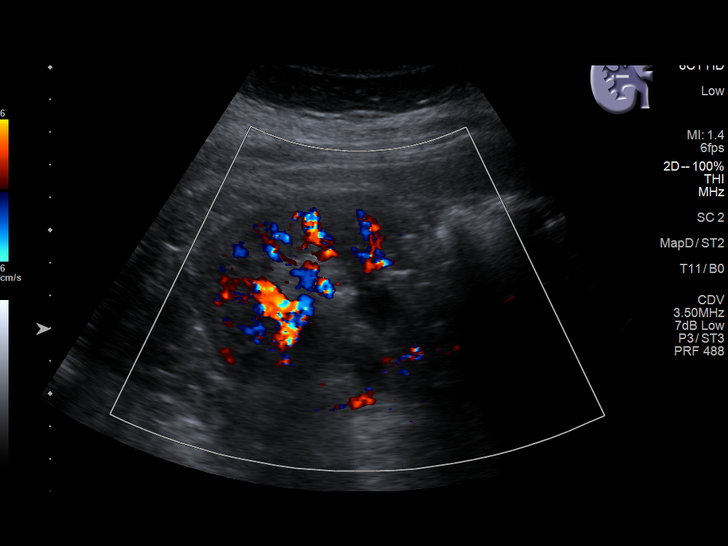
[im 31/58]
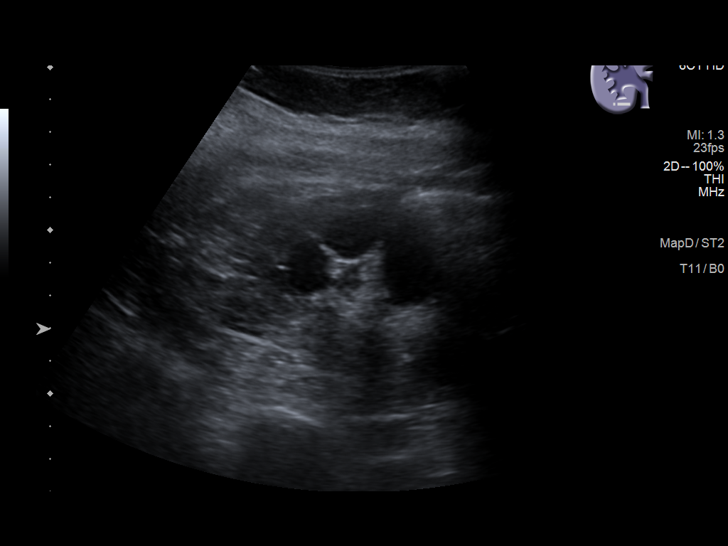
[im 36/58]
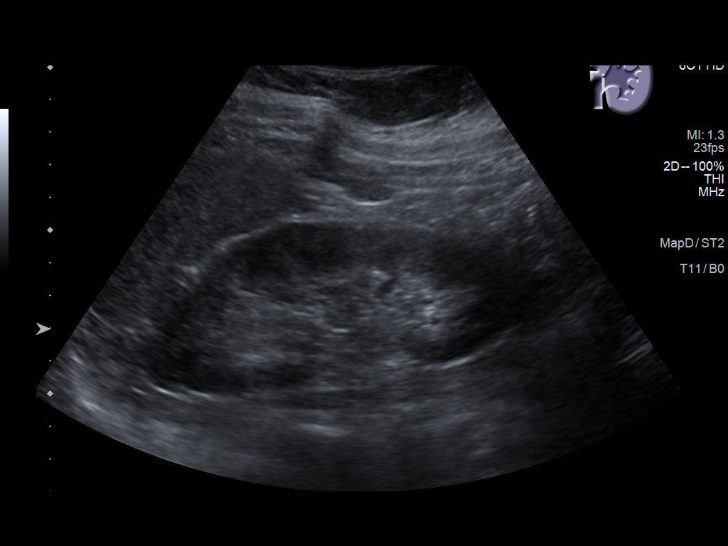
[im 39/58]
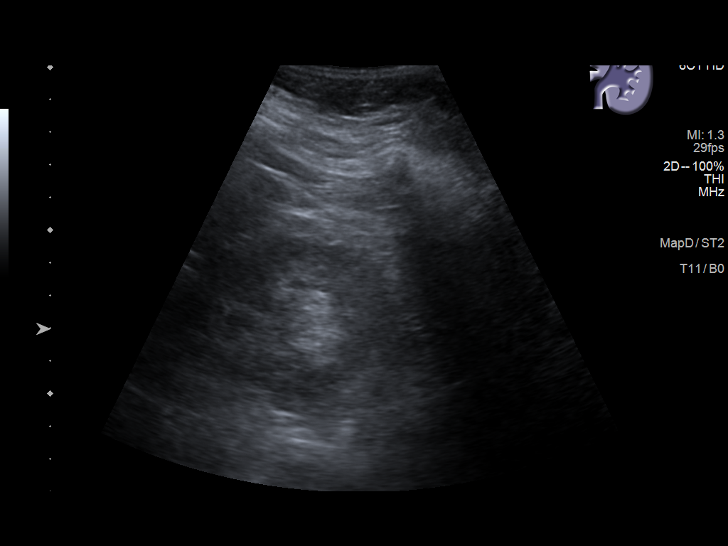
[im 43/58]
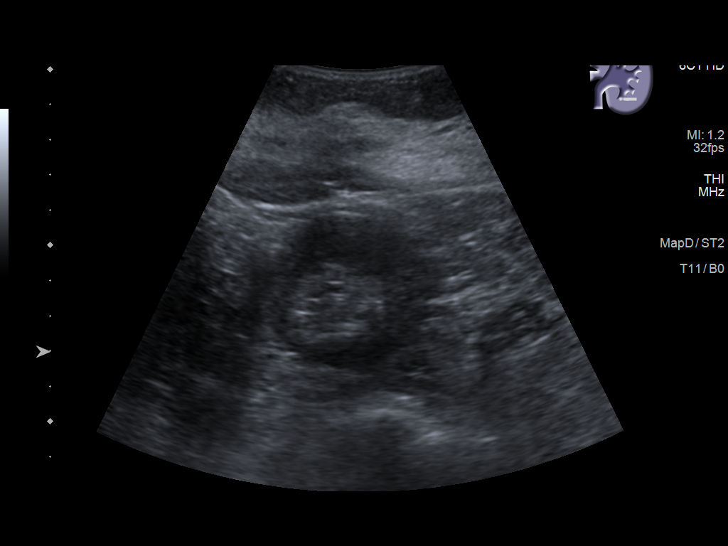
[im 48/58]
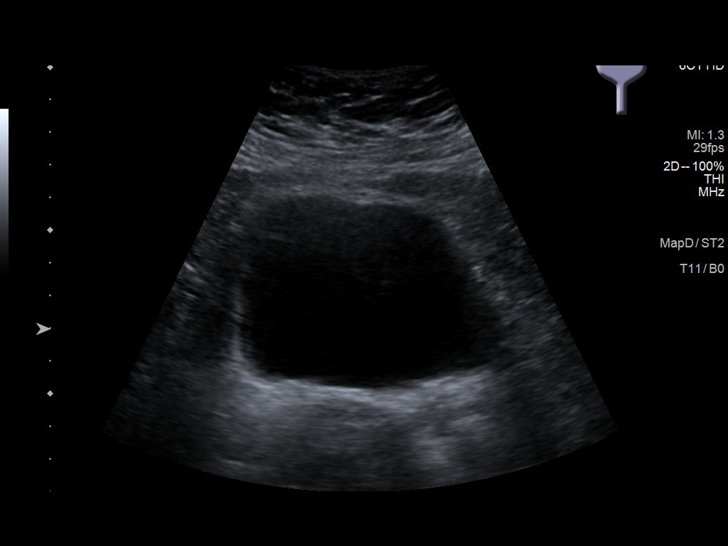
[im 53/58]
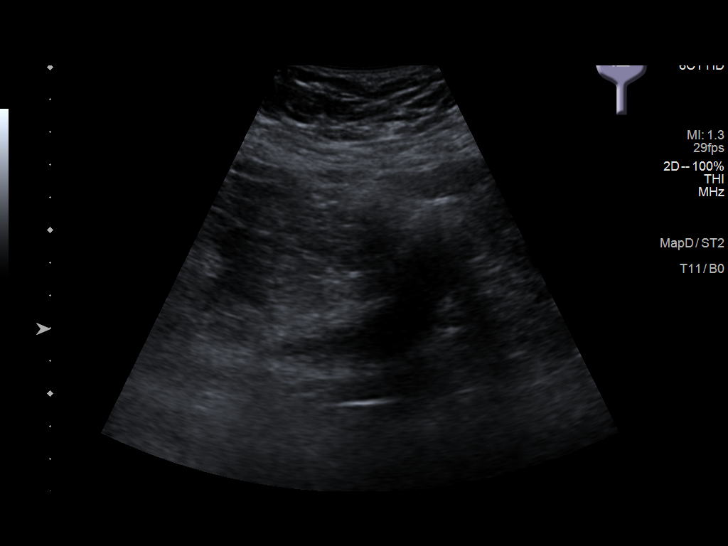
[im 58/58]
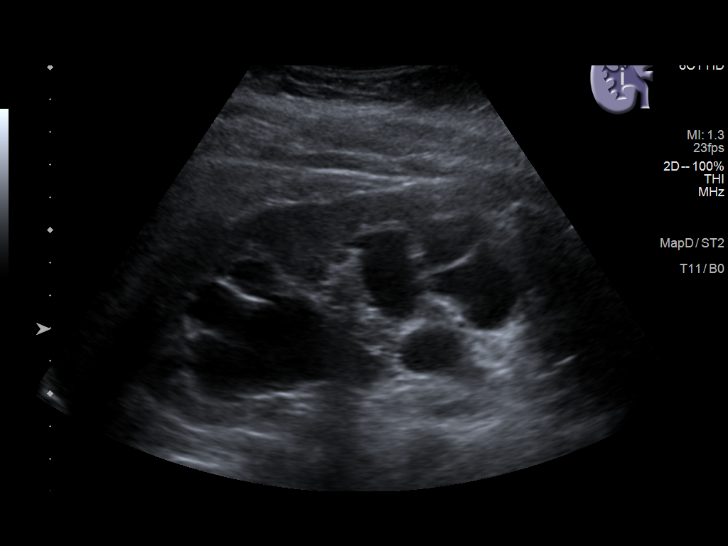

[14 of 25 positions shown; findings below may reference images not displayed]

FINDINGS: Right Kidney:

Renal measurements: 12.8 x 6.4 x 5.4 cm. = volume: 232 mL. There are
multiple stones within the right kidney largest measures 6.5 mm.
Moderate right hydronephrosis is identified.

Left Kidney:

Renal measurements: 11.8 x 5 x 5.4 cm = volume: 166 mL. Echogenicity
within normal limits. No mass or hydronephrosis visualized.

Bladder:

Appears normal for degree of bladder distention. Bilateral ureteral
jets are noted.
IMPRESSION: Moderate right hydronephrosis with multiple stones in the right
kidney, largest measures 6.5 mm. The degree of right hydronephrosis
is probably slightly worse compared to prior ultrasound.

## 2020-01-01 DIAGNOSIS — Z803 Family history of malignant neoplasm of breast: Secondary | ICD-10-CM

## 2020-01-01 DIAGNOSIS — Z1371 Encounter for nonprocreative screening for genetic disease carrier status: Secondary | ICD-10-CM

## 2020-01-01 HISTORY — DX: Encounter for nonprocreative screening for genetic disease carrier status: Z13.71

## 2020-01-01 HISTORY — DX: Family history of malignant neoplasm of breast: Z80.3

## 2020-01-04 NOTE — Progress Notes (Signed)
Valerie Roys, DO   Chief Complaint  Patient presents with  . Menstrual Problem    last cycle lasted 4 weeks, no abnormal pain    HPI:      Ms. Candice Robinson is a 49 y.o. No obstetric history on file. whose LMP was Patient's last menstrual period was 11/23/2019 (exact date)., presents today for NP > 3 yrs eval of AUB. Menses have been Q2 wks-6 months the past 2 yrs, lasting 5-6 days, mod flow, no BTB, mod dysmen, improved with NSAIDs. No vasomotor sx. LMP lasted 4 wks, heavier than usual for 1 wk then lighter 2nd wk. Last 2 wks with light flow. PMP was 2 months earlier. No FH ovar/uterine cancer.  She is sex active, s/p TL. No pain/bleeding with sex. No hx of HTN, DVTS, migraines with aura. Did OCPs in recent past without side effects.  No recent pap, no hx of abn paps.   FH breast cancer in 3 mat aunts, genetic testing not done. Last mammo 7/19 cat 3 due to RT breast calcification, stable for 1 yr. Due for dx mammo bilat in 1 yr--not done last yr. No colonoscopy done.  Past Medical History:  Diagnosis Date  . Complication of anesthesia    ponv   . Kidney stone   . PONV (postoperative nausea and vomiting)    Requires phenergan for both procedures    Past Surgical History:  Procedure Laterality Date  . CYSTOSCOPY WITH STENT PLACEMENT Right 12/03/2017   Procedure: CYSTOSCOPY WITH STENT PLACEMENT;  Surgeon: Billey Co, MD;  Location: [facility];  Service: Urology;  Laterality: Right;  . CYSTOSCOPY/URETEROSCOPY/HOLMIUM LASER/STENT PLACEMENT Right 12/27/2017   Procedure: CYSTOSCOPY/URETEROSCOPY/HOLMIUM LASER/STENT Exchange;  Surgeon: Billey Co, MD;  Location: [facility];  Service: Urology;  Laterality: Right;  Large renal stone  . CYSTOSCOPY/URETEROSCOPY/HOLMIUM LASER/STENT PLACEMENT Right 01/07/2018   Procedure: CYSTOSCOPY/URETEROSCOPY/HOLMIUM LASER/STENT Exchange;  Surgeon: Billey Co, MD;  Location: [facility];  Service: Urology;  Laterality: Right;  Large renal  stone  . HOLMIUM LASER APPLICATION Right 95/03/8839   Procedure: HOLMIUM LASER APPLICATION;  Surgeon: Billey Co, MD;  Location: [facility];  Service: Urology;  Laterality: Right;  . IR NEPHROSTOMY PLACEMENT RIGHT  12/03/2017  . NEPHROLITHOTOMY Right 12/03/2017   Procedure: NEPHROLITHOTOMY PERCUTANEOUS;  Surgeon: Billey Co, MD;  Location: [facility];  Service: Urology;  Laterality: Right;  . TUBAL LIGATION  1996  . tummy tuck  2004    Family History  Problem Relation Age of Onset  . Diabetes Mother   . Hyperlipidemia Mother   . Hypertension Mother   . Hyperlipidemia Father   . Hypertension Father   . Heart disease Father   . Lupus Sister   . Diabetes Sister   . Hypertension Sister   . Breast cancer Maternal Aunt        45-60  . Breast cancer Maternal Aunt        45-60  . Breast cancer Maternal Aunt        45-60  . Bladder Cancer Neg Hx   . Kidney cancer Neg Hx     Social History   Socioeconomic History  . Marital status: Married    Spouse name: richard  . Number of children: Not on file  . Years of education: Not on file  . Highest education level: Not on file  Occupational History  . Occupation: Librarian, academic  Tobacco Use  . Smoking status: Never Smoker  . Smokeless tobacco:  Never Used  Vaping Use  . Vaping Use: Never used  Substance and Sexual Activity  . Alcohol use: Yes    Comment: socially. maybe 1 drink every 2 or 3 months  . Drug use: No  . Sexual activity: Yes    Birth control/protection: Surgical  Other Topics Concern  . Not on file  Social History Narrative  . Not on file   Social Determinants of Health   Financial Resource Strain:   . Difficulty of Paying Living Expenses: Not on file  Food Insecurity:   . Worried About Charity fundraiser in the Last Year: Not on file  . Ran Out of Food in the Last Year: Not on file  Transportation Needs:   . Lack of Transportation (Medical): Not on file  . Lack of Transportation (Non-Medical): Not on  file  Physical Activity:   . Days of Exercise per Week: Not on file  . Minutes of Exercise per Session: Not on file  Stress:   . Feeling of Stress : Not on file  Social Connections:   . Frequency of Communication with Friends and Family: Not on file  . Frequency of Social Gatherings with Friends and Family: Not on file  . Attends Religious Services: Not on file  . Active Member of Clubs or Organizations: Not on file  . Attends Archivist Meetings: Not on file  . Marital Status: Not on file  Intimate Partner Violence:   . Fear of Current or Ex-Partner: Not on file  . Emotionally Abused: Not on file  . Physically Abused: Not on file  . Sexually Abused: Not on file    Outpatient Medications Prior to Visit  Medication Sig Dispense Refill  . diphenhydrAMINE (BENADRYL) 25 MG tablet Take 25 mg by mouth at bedtime as needed for allergies.    Marland Kitchen ibuprofen (ADVIL,MOTRIN) 200 MG tablet Take 3 tablets (600 mg total) by mouth every 6 (six) hours as needed for headache or moderate pain. 30 tablet 0  . Probiotic Product (PROBIOTIC DAILY PO) Take 1 capsule by mouth daily.     Marland Kitchen acetaminophen (TYLENOL) 500 MG tablet Take 2 tablets (1,000 mg total) by mouth every 6 (six) hours as needed for mild pain (or Fever >/= 101). (Patient not taking: Reported on 11/16/2018)    . oxybutynin (DITROPAN-XL) 10 MG 24 hr tablet TAKE 1 TABLET BY MOUTH EVERY DAY AS NEEDED 90 tablet 1   No facility-administered medications prior to visit.      ROS:  Review of Systems  Constitutional: Negative for fever.  Gastrointestinal: Negative for blood in stool, constipation, diarrhea, nausea and vomiting.  Genitourinary: Positive for menstrual problem. Negative for dyspareunia, dysuria, flank pain, frequency, hematuria, urgency, vaginal bleeding, vaginal discharge and vaginal pain.  Musculoskeletal: Negative for back pain.  Skin: Negative for rash.  BREAST: No symptoms   OBJECTIVE:   Vitals:  BP 130/90   Ht  '5\' 3"'  (1.6 m)   Wt 153 lb (69.4 kg)   LMP 11/23/2019 (Exact Date)   BMI 27.10 kg/m   Physical Exam Vitals reviewed.  Constitutional:      Appearance: She is well-developed.  Pulmonary:     Effort: Pulmonary effort is normal.  Genitourinary:    General: Normal vulva.     Pubic Area: No rash.      Labia:        Right: No rash, tenderness or lesion.        Left: No rash, tenderness or lesion.  Vagina: Normal. No vaginal discharge, erythema or tenderness.     Cervix: Normal.     Uterus: Normal. Not enlarged and not tender.      Adnexa: Right adnexa normal and left adnexa normal.       Right: No mass or tenderness.         Left: No mass or tenderness.    Musculoskeletal:        General: Normal range of motion.     Cervical back: Normal range of motion.  Skin:    General: Skin is warm and dry.  Neurological:     General: No focal deficit present.     Mental Status: She is alert and oriented to person, place, and time.  Psychiatric:        Mood and Affect: Mood normal.        Behavior: Behavior normal.        Thought Content: Thought content normal.        Judgment: Judgment normal.     Assessment/Plan: Abnormal uterine bleeding (AUB) - Plan: TSH + free T4, Prolactin, US PELVIS TRANSVAGINAL NON-OB (TV ONLY); check labs, pap and GYN u/s. If neg, most likely perimenopausal. Discussed hormones/IUD/ablation for sx control. Will f/u with results.   Thyroid disorder screening - Plan: TSH + free T4  Cervical cancer screening - Plan: Cytology - PAP  Screening for HPV (human papillomavirus) - Plan: Cytology - PAP  Family history of breast cancer - Plan: MM DIAG BREAST TOMO BILATERAL, [facility] ([place]); My Risk testing discussed and done today. Will call with results. Handout given.   Encounter for screening mammogram for malignant neoplasm of breast - Plan: MM DIAG BREAST TOMO BILATERAL; pt to sched dx mammo f/u. Order placed so pt can sched.    Abnormal mammogram - Plan: MM DIAG BREAST TOMO BILATERAL  Screening for colon cancer--pt aware of colonoscopy recommendations.    Return in about 1 day (around 01/09/2020) for GYN u/s for AUB--[facility] to call pt.  Jhoel Stieg B. Ammar Moffatt, PA-C 01/08/2020 10:32 AM

## 2020-01-08 ENCOUNTER — Ambulatory Visit (INDEPENDENT_AMBULATORY_CARE_PROVIDER_SITE_OTHER): Payer: Commercial Managed Care - PPO | Admitting: Obstetrics and Gynecology

## 2020-01-08 ENCOUNTER — Other Ambulatory Visit (HOSPITAL_COMMUNITY)
Admission: RE | Admit: 2020-01-08 | Discharge: 2020-01-08 | Disposition: A | Discharge status: Home / Self Care | Payer: Commercial Managed Care - PPO | Source: Ambulatory Visit | Attending: Obstetrics and Gynecology | Admitting: Obstetrics and Gynecology

## 2020-01-08 ENCOUNTER — Other Ambulatory Visit: Payer: Self-pay

## 2020-01-08 ENCOUNTER — Encounter: Payer: Self-pay | Admitting: Obstetrics and Gynecology

## 2020-01-08 VITALS — BP 130/90 | Ht 63.0 in | Wt 153.0 lb

## 2020-01-08 DIAGNOSIS — R899 Unspecified abnormal finding in specimens from other organs, systems and tissues: Secondary | ICD-10-CM

## 2020-01-08 DIAGNOSIS — Z1231 Encounter for screening mammogram for malignant neoplasm of breast: Secondary | ICD-10-CM

## 2020-01-08 DIAGNOSIS — Z1151 Encounter for screening for human papillomavirus (HPV): Secondary | ICD-10-CM

## 2020-01-08 DIAGNOSIS — R928 Other abnormal and inconclusive findings on diagnostic imaging of breast: Secondary | ICD-10-CM

## 2020-01-08 DIAGNOSIS — N939 Abnormal uterine and vaginal bleeding, unspecified: Secondary | ICD-10-CM | POA: Diagnosis not present

## 2020-01-08 DIAGNOSIS — Z803 Family history of malignant neoplasm of breast: Secondary | ICD-10-CM

## 2020-01-08 DIAGNOSIS — Z1329 Encounter for screening for other suspected endocrine disorder: Secondary | ICD-10-CM

## 2020-01-08 DIAGNOSIS — Z124 Encounter for screening for malignant neoplasm of cervix: Secondary | ICD-10-CM

## 2020-01-08 NOTE — Patient Instructions (Signed)
I value your feedback and entrusting us with your care. If you get a [facility] patient survey, I would appreciate you taking the time to let us know about your experience today. Thank you!  As of February 09, 2019, your lab results will be released to your MyChart immediately, before I even have a chance to see them. Please give me time to review them and contact you if there are any abnormalities. Thank you for your patience.  

## 2020-01-09 ENCOUNTER — Other Ambulatory Visit: Payer: Self-pay | Admitting: Obstetrics and Gynecology

## 2020-01-09 ENCOUNTER — Ambulatory Visit (INDEPENDENT_AMBULATORY_CARE_PROVIDER_SITE_OTHER): Payer: Commercial Managed Care - PPO

## 2020-01-09 DIAGNOSIS — N939 Abnormal uterine and vaginal bleeding, unspecified: Secondary | ICD-10-CM

## 2020-01-09 LAB — TSH+FREE T4
Free T4: 1.13 ng/dL (ref 0.82–1.77)
TSH: 4.57 u[IU]/mL — ABNORMAL HIGH (ref 0.450–4.500)

## 2020-01-09 LAB — PROLACTIN: Prolactin: 11.7 ng/mL (ref 4.8–23.3)

## 2020-01-09 NOTE — Addendum Note (Signed)
Addended by: Althea Grimmer B on: 01/09/2020 02:05 PM   Modules accepted: Orders

## 2020-01-10 ENCOUNTER — Telehealth: Payer: Self-pay | Admitting: Obstetrics and Gynecology

## 2020-01-10 NOTE — Telephone Encounter (Signed)
Pt aware of neg GYN u/s results. Has small leio, EM=3 mm. Pt with perimenopausal AUB. Had slightly abn TSH, repeating lab in 4 wks. Waiting for pap results. If all normal, pt wants to follow cycles for now.

## 2020-01-11 LAB — CYTOLOGY - PAP
Comment: NEGATIVE
Diagnosis: UNDETERMINED — AB
High risk HPV: NEGATIVE

## 2020-01-18 ENCOUNTER — Encounter: Payer: Self-pay | Admitting: Obstetrics and Gynecology

## 2020-02-01 ENCOUNTER — Telehealth: Payer: Self-pay | Admitting: Obstetrics and Gynecology

## 2020-02-01 NOTE — Telephone Encounter (Signed)
Pt aware of neg MyRisk results. IBIS-14.4%/riskscore=17.8%. Recommend monthly SBE, yearly CBE and mammos. No addl screening recommended.   Patient understands these results only apply to her and her children, and this is not indicative of genetic testing results of her other family members. It is recommended that her other family members have genetic testing done.  Pt also understands negative genetic testing doesn't mean she will never get any of these cancers.   Hard copy mailed to pt. F/u prn.

## 2020-03-08 ENCOUNTER — Other Ambulatory Visit: Payer: Self-pay

## 2020-03-08 ENCOUNTER — Other Ambulatory Visit: Payer: Commercial Managed Care - PPO

## 2020-03-08 DIAGNOSIS — R899 Unspecified abnormal finding in specimens from other organs, systems and tissues: Secondary | ICD-10-CM

## 2020-03-09 LAB — TSH+FREE T4
Free T4: 1.11 ng/dL (ref 0.82–1.77)
TSH: 5.71 u[IU]/mL — ABNORMAL HIGH (ref 0.450–4.500)

## 2020-03-21 ENCOUNTER — Ambulatory Visit: Payer: Commercial Managed Care - PPO | Admitting: Family Medicine

## 2020-03-21 ENCOUNTER — Encounter: Payer: Self-pay | Admitting: Family Medicine

## 2020-03-21 ENCOUNTER — Other Ambulatory Visit: Payer: Self-pay

## 2020-03-21 VITALS — BP 137/92 | HR 73 | Temp 98.0°F | Wt 154.6 lb

## 2020-03-21 DIAGNOSIS — E038 Other specified hypothyroidism: Secondary | ICD-10-CM

## 2020-03-21 NOTE — Progress Notes (Signed)
BP (!) 137/92    Pulse 73    Temp 98 F (36.7 C)    Wt 154 lb 9.6 oz (70.1 kg)    SpO2 97%    BMI 27.39 kg/m    Subjective:    Patient ID: Candice Robinson, female    DOB: 07-20-1970, 50 y.o.   MRN: [id number]  HPI: Candice Robinson is a 50 y.o. female  Chief Complaint  Patient presents with   Thyroid Problem    Patient was seen at GYN and had blood work done, was told her thyroid level was abnormal to follow up with PCP    SUBCLINICAL HYPOTHYROIDISM Thyroid control status:stable Satisfied with current treatment? yes Medication side effects: not on anything Fatigue: no Cold intolerance: no Heat intolerance: yes Weight gain: no Weight loss: no Constipation: no Diarrhea/loose stools: no Palpitations: no Lower extremity edema: no Anxiety/depressed mood: no   Has been having abnormal periods where she will have long periods and then skip months at a time.   Relevant past medical, surgical, family and social history reviewed and updated as indicated. Interim medical history since our last visit reviewed. Allergies and medications reviewed and updated.  Review of Systems  Constitutional: Negative.   Respiratory: Negative.   Cardiovascular: Negative.   Gastrointestinal: Negative.   Genitourinary: Positive for menstrual problem. Negative for decreased urine volume, difficulty urinating, dyspareunia, dysuria, enuresis, flank pain, frequency, genital sores, hematuria, pelvic pain, urgency, vaginal bleeding, vaginal discharge and vaginal pain.  Musculoskeletal: Negative.   Psychiatric/Behavioral: Negative.     Per HPI unless specifically indicated above     Objective:    BP (!) 137/92    Pulse 73    Temp 98 F (36.7 C)    Wt 154 lb 9.6 oz (70.1 kg)    SpO2 97%    BMI 27.39 kg/m   Wt Readings from Last 3 Encounters:  03/21/20 154 lb 9.6 oz (70.1 kg)  01/08/20 153 lb (69.4 kg)  11/16/18 171 lb (77.6 kg)    Physical Exam Vitals and nursing note reviewed.   Constitutional:      General: She is not in acute distress.    Appearance: Normal appearance. She is not ill-appearing, toxic-appearing or diaphoretic.  HENT:     Head: Normocephalic and atraumatic.     Right Ear: External ear normal.     Left Ear: External ear normal.     Nose: Nose normal.     Mouth/Throat:     Mouth: Mucous membranes are moist.     Pharynx: Oropharynx is clear.  Eyes:     General: No scleral icterus.       Right eye: No discharge.        Left eye: No discharge.     Extraocular Movements: Extraocular movements intact.     Conjunctiva/sclera: Conjunctivae normal.     Pupils: Pupils are equal, round, and reactive to light.  Cardiovascular:     Rate and Rhythm: Normal rate and regular rhythm.     Pulses: Normal pulses.     Heart sounds: Normal heart sounds. No murmur heard. No friction rub. No gallop.   Pulmonary:     Effort: Pulmonary effort is normal. No respiratory distress.     Breath sounds: Normal breath sounds. No stridor. No wheezing, rhonchi or rales.  Chest:     Chest wall: No tenderness.  Musculoskeletal:        General: Normal range of motion.     Cervical back:  Normal range of motion and neck supple.  Skin:    General: Skin is warm and dry.     Capillary Refill: Capillary refill takes less than 2 seconds.     Coloration: Skin is not jaundiced or pale.     Findings: No bruising, erythema, lesion or rash.  Neurological:     General: No focal deficit present.     Mental Status: She is alert and oriented to person, place, and time. Mental status is at baseline.  Psychiatric:        Mood and Affect: Mood normal.        Behavior: Behavior normal.        Thought Content: Thought content normal.        Judgment: Judgment normal.     Results for orders placed or performed in visit on 03/08/20  TSH + free T4  Result Value Ref Range   TSH 5.710 (H) 0.450 - 4.500 uIU/mL   Free T4 1.11 0.82 - 1.77 ng/dL      Assessment & Plan:   Problem List  Items Addressed This Visit      Endocrine   Subclinical hypothyroidism - Primary    Discussed with patient that if she doesn't want to be on medicine, we can watch her for a while. Has had subclinical hypothyroidism for 4 years and has only had issues with her period for 2. She would like to hold on meds for now. Continue to monitor.           Follow up plan: Return in about 6 months (around 09/18/2020).

## 2020-03-21 NOTE — Patient Instructions (Signed)
Iodine is found mainly in animal protein foods and sea vegetables, and to a lesser extent in fortified foods like breads, cereals, and milk. Seaweed (nori, kelp, kombu, wakame) Fish, shellfish (cod, canned tuna, oysters, shrimp) Table salts labeled "iodized" Dairy (milk, cheese, yogurt) Eggs. Beef liver. Chicken.

## 2020-03-22 ENCOUNTER — Encounter: Payer: Self-pay | Admitting: Family Medicine

## 2020-03-22 DIAGNOSIS — E038 Other specified hypothyroidism: Secondary | ICD-10-CM | POA: Insufficient documentation

## 2020-03-22 NOTE — Assessment & Plan Note (Signed)
Discussed with patient that if she doesn't want to be on medicine, we can watch her for a while. Has had subclinical hypothyroidism for 4 years and has only had issues with her period for 2. She would like to hold on meds for now. Continue to monitor.

## 2020-04-15 ENCOUNTER — Ambulatory Visit (INDEPENDENT_AMBULATORY_CARE_PROVIDER_SITE_OTHER): Payer: Commercial Managed Care - PPO | Admitting: Family Medicine

## 2020-04-15 ENCOUNTER — Encounter: Payer: Self-pay | Admitting: Family Medicine

## 2020-04-15 ENCOUNTER — Other Ambulatory Visit: Payer: Self-pay

## 2020-04-15 VITALS — BP 113/78 | HR 67 | Temp 98.6°F | Ht 64.0 in | Wt 156.2 lb

## 2020-04-15 DIAGNOSIS — E038 Other specified hypothyroidism: Secondary | ICD-10-CM

## 2020-04-15 DIAGNOSIS — Z0000 Encounter for general adult medical examination without abnormal findings: Secondary | ICD-10-CM

## 2020-04-15 NOTE — Assessment & Plan Note (Signed)
Rechecking labs today. Await results. Treat as needed.  °

## 2020-04-15 NOTE — Patient Instructions (Signed)
Health Maintenance, Female Adopting a healthy lifestyle and getting preventive care are important in promoting health and wellness. Ask your health care provider about:  The right schedule for you to have regular tests and exams.  Things you can do on your own to prevent diseases and keep yourself healthy. What should I know about diet, weight, and exercise? Eat a healthy diet  Eat a diet that includes plenty of vegetables, fruits, low-fat dairy products, and lean protein.  Do not eat a lot of foods that are high in solid fats, added sugars, or sodium.   Maintain a healthy weight Body mass index (BMI) is used to identify weight problems. It estimates body fat based on height and weight. Your health care provider can help determine your BMI and help you achieve or maintain a healthy weight. Get regular exercise Get regular exercise. This is one of the most important things you can do for your health. Most adults should:  Exercise for at least 150 minutes each week. The exercise should increase your heart rate and make you sweat (moderate-intensity exercise).  Do strengthening exercises at least twice a week. This is in addition to the moderate-intensity exercise.  Spend less time sitting. Even light physical activity can be beneficial. Watch cholesterol and blood lipids Have your blood tested for lipids and cholesterol at 50 years of age, then have this test every 5 years. Have your cholesterol levels checked more often if:  Your lipid or cholesterol levels are high.  You are older than 50 years of age.  You are at high risk for heart disease. What should I know about cancer screening? Depending on your health history and family history, you may need to have cancer screening at various ages. This may include screening for:  Breast cancer.  Cervical cancer.  Colorectal cancer.  Skin cancer.  Lung cancer. What should I know about heart disease, diabetes, and high blood  pressure? Blood pressure and heart disease  High blood pressure causes heart disease and increases the risk of stroke. This is more likely to develop in people who have high blood pressure readings, are of African descent, or are overweight.  Have your blood pressure checked: ? Every 3-5 years if you are 59-59 years of age. ? Every year if you are 63 years old or older. Diabetes Have regular diabetes screenings. This checks your fasting blood sugar level. Have the screening done:  Once every three years after age 58 if you are at a normal weight and have a low risk for diabetes.  More often and at a younger age if you are overweight or have a high risk for diabetes. What should I know about preventing infection? Hepatitis B If you have a higher risk for hepatitis B, you should be screened for this virus. Talk with your health care provider to find out if you are at risk for hepatitis B infection. Hepatitis C Testing is recommended for:  Everyone born from [birth year] through 1965.  Anyone with known risk factors for hepatitis C. Sexually transmitted infections (STIs)  Get screened for STIs, including gonorrhea and chlamydia, if: ? You are sexually active and are younger than 50 years of age. ? You are older than 50 years of age and your health care provider tells you that you are at risk for this type of infection. ? Your sexual activity has changed since you were last screened, and you are at increased risk for chlamydia or gonorrhea. Ask your health care provider  if you are at risk.  Ask your health care provider about whether you are at high risk for HIV. Your health care provider may recommend a prescription medicine to help prevent HIV infection. If you choose to take medicine to prevent HIV, you should first get tested for HIV. You should then be tested every 3 months for as long as you are taking the medicine. Pregnancy  If you are about to stop having your period (premenopausal) and  you may become pregnant, seek counseling before you get pregnant.  Take 400 to 800 micrograms (mcg) of folic acid every day if you become pregnant.  Ask for birth control (contraception) if you want to prevent pregnancy. Osteoporosis and menopause Osteoporosis is a disease in which the bones lose minerals and strength with aging. This can result in bone fractures. If you are 20 years old or older, or if you are at risk for osteoporosis and fractures, ask your health care provider if you should:  Be screened for bone loss.  Take a calcium or vitamin D supplement to lower your risk of fractures.  Be given hormone replacement therapy (HRT) to treat symptoms of menopause. Follow these instructions at home: Lifestyle  Do not use any products that contain nicotine or tobacco, such as cigarettes, e-cigarettes, and chewing tobacco. If you need help quitting, ask your health care provider.  Do not use street drugs.  Do not share needles.  Ask your health care provider for help if you need support or information about quitting drugs. Alcohol use  Do not drink alcohol if: ? Your health care provider tells you not to drink. ? You are pregnant, may be pregnant, or are planning to become pregnant.  If you drink alcohol: ? Limit how much you use to 0-1 drink a day. ? Limit intake if you are breastfeeding.  Be aware of how much alcohol is in your drink. In the [place] one drink equals one 12 oz bottle of beer (355 mL), one 5 oz glass of wine (148 mL), or one 1 oz glass of hard liquor (44 mL). General instructions  Schedule regular health, dental, and eye exams.  Stay current with your vaccines.  Tell your health care provider if: ? You often feel depressed. ? You have ever been abused or do not feel safe at home. Summary  Adopting a healthy lifestyle and getting preventive care are important in promoting health and wellness.  Follow your health care provider's instructions about healthy  diet, exercising, and getting tested or screened for diseases.  Follow your health care provider's instructions on monitoring your cholesterol and blood pressure. This information is not intended to replace advice given to you by your health care provider. Make sure you discuss any questions you have with your health care provider. Document Revised: 02/09/2018 Document Reviewed: 02/09/2018 Elsevier Patient Education  2021 [employer].  https://www.cancer.org/cancer/colon-rectal-cancer/causes-risks-prevention/risk-factors.html">  Colorectal Cancer Screening  Colorectal cancer screening is a group of tests that are used to check for colorectal cancer before symptoms develop. Colorectal refers to the colon and rectum. The colon and rectum are located at the end of the digestive tract and carry stool (feces) out of the body. Who should have screening? All adults who are 66-91 years old should have screening. Your health care provider may recommend screening before age 54. You will have tests every 1-10 years, depending on your results and the type of screening test. Screening recommendations for adults who are 50-77 years old vary depending on a  person's health. People older than age 71 should no longer get colorectal cancer screening. You may have screening tests starting before age 80, or more often than other people, if you have any of these risk factors:  A personal or family history of colorectal cancer or abnormal growths known as polyps in your colon.  Inflammatory bowel disease, such as ulcerative colitis or Crohn's disease.  A history of having radiation treatment to the abdomen or the area between the hip bones (pelvic area) for cancer.  A type of genetic syndrome that is passed from parent to child (hereditary), such as: ? Lynch syndrome. ? Familial adenomatous polyposis. ? Turcot syndrome. ? Peutz-Jeghers syndrome. ? MUTYH-associated polyposis (MAP).  A personal history of  diabetes. Types of tests There are several types of colorectal screening tests. You may have one or more of the following:  Guaiac-based fecal occult blood testing. For this test, a stool sample is checked for hidden (occult) blood, which could be a sign of colorectal cancer.  Fecal immunochemical test (FIT). For this test, a stool sample is checked for blood, which could be a sign of colorectal cancer.  Stool DNA test. For this test, a stool sample is checked for blood and changes in DNA that could lead to colorectal cancer.  Sigmoidoscopy. During this test, a thin, flexible tube with a camera on the end, called a sigmoidoscope, is used to examine the rectum and the lower colon.  Colonoscopy. During this test, a long, flexible tube with a camera on the end, called a colonoscope, is used to examine the entire colon and rectum. Also, sometimes a tissue sample is taken to be looked at under a microscope (biopsy) or small polyps are removed during this test.  Virtual colonoscopy. Instead of a colonoscope, this type of colonoscopy uses a CT scan to take pictures of the colon and rectum. A CT scan is a type of X-ray that is made using computers. What are the benefits of screening? Screening reduces your risk for colorectal cancer and can help identify cancer at an early stage, when the cancer can be removed or treated more easily. It is common for polyps to form in the lining of the colon, especially as you age. These polyps may be cancerous or become cancerous over time. Screening can identify these polyps. What are the risks of screening? Generally, these are safe tests. However, problems may occur, including:  The need for more tests to confirm results from a stool sample test. Stool sample tests have fewer risks than other types of screening tests.  Being exposed to low levels of radiation, if you had a test involving X-rays. This may slightly increase your cancer risk. The benefit of detecting  cancer outweighs the slight increase in risk.  Bleeding, damage to the intestine, or infection caused by a sigmoidoscopy or colonoscopy.  A reaction to medicines given during a sigmoidoscopy or colonoscopy. Talk with your health care provider to understand your risk for colorectal cancer and to make a screening plan that is right for you. Questions to ask your health care provider  When should I start colorectal cancer screening?  What is my risk for colorectal cancer?  How often do I need screening?  Which screening tests do I need?  How do I get my test results?  What do my results mean? Where to find more information Learn more about colorectal cancer screening from:  The American Cancer Society: cancer.[place]: cancer.gov Summary  Colorectal  cancer screening is a group of tests used to check for colorectal cancer before symptoms develop.  All adults who are 12-96 years old should have screening. Your health care provider may recommend screening before age 55.  You may have screening tests starting before age 33, or more often than other people, if you have certain risk factors.  Screening reduces your risk for colorectal cancer and can help identify cancer at an early stage, when the cancer can be removed or treated more easily.  Talk with your health care provider to understand your risk for colorectal cancer and to make a screening plan that is right for you. This information is not intended to replace advice given to you by your health care provider. Make sure you discuss any questions you have with your health care provider. Document Revised: 06/07/2019 Document Reviewed: 06/07/2019 Elsevier Patient Education  2021 [place].  Colonoscopy, Adult A colonoscopy is a procedure to look at the entire large intestine. This procedure is done using a long, thin, flexible tube that has a camera on the end. You may have a colonoscopy:  As a part of  normal colorectal screening.  If you have certain symptoms, such as: ? A low number of red blood cells in your blood (anemia). ? Diarrhea that does not go away. ? Pain in your abdomen. ? Blood in your stool. A colonoscopy can help screen for and diagnose medical problems, including:  Tumors.  Extra tissue that grows where mucus forms (polyps).  Inflammation.  Areas of bleeding. Tell your health care provider about:  Any allergies you have.  All medicines you are taking, including vitamins, herbs, eye drops, creams, and over-the-counter medicines.  Any problems you or family members have had with anesthetic medicines.  Any blood disorders you have.  Any surgeries you have had.  Any medical conditions you have.  Any problems you have had with having bowel movements.  Whether you are pregnant or may be pregnant. What are the risks? Generally, this is a safe procedure. However, problems may occur, including:  Bleeding.  Damage to your intestine.  Allergic reactions to medicines given during the procedure.  Infection. This is rare. What happens before the procedure? Eating and drinking restrictions Follow instructions from your health care provider about eating or drinking restrictions, which may include:  A few days before the procedure: ? Follow a low-fiber diet. ? Avoid nuts, seeds, dried fruit, raw fruits, and vegetables.  1-3 days before the procedure: ? Eat only gelatin dessert or ice pops. ? Drink only clear liquids, such as water, clear juice, clear broth or bouillon, black coffee or tea, or clear soft drinks or sports drinks. ? Avoid liquids that contain red or purple dye.  The day of the procedure: ? Do not eat solid foods. You may continue to drink clear liquids until up to 2 hours before the procedure. ? Do not eat or drink anything starting 2 hours before the procedure, or within the time period that your health care provider recommends. Bowel  prep If you were prescribed a bowel prep to take by mouth (orally) to clean out your colon:  Take it as told by your health care provider. Starting the day before your procedure, you will need to drink a large amount of liquid medicine. The liquid will cause you to have many bowel movements of loose stool until your stool becomes almost clear or light green.  If your skin or the opening between the buttocks (anus)  gets irritated from diarrhea, you may relieve the irritation using: ? Wipes with medicine in them, such as adult wet wipes with aloe and vitamin E. ? A product to soothe skin, such as petroleum jelly.  If you vomit while drinking the bowel prep: ? Take a break for up to 60 minutes. ? Begin the bowel prep again. ? Call your health care provider if you keep vomiting or you cannot take the bowel prep without vomiting.  To clean out your colon, you may also be given: ? Laxative medicines. These help you have a bowel movement. ? Instructions for enema use. An enema is liquid medicine injected into your rectum. Medicines Ask your health care provider about:  Changing or stopping your regular medicines or supplements. This is especially important if you are taking iron supplements, diabetes medicines, or blood thinners.  Taking medicines such as aspirin and ibuprofen. These medicines can thin your blood. Do not take these medicines unless your health care provider tells you to take them.  Taking over-the-counter medicines, vitamins, herbs, and supplements. General instructions  Ask your health care provider what steps will be taken to help prevent infection. These may include washing skin with a germ-killing soap.  Plan to have someone take you home from the hospital or clinic. What happens during the procedure?  An IV will be inserted into one of your veins.  You may be given one or more of the following: ? A medicine to help you relax (sedative). ? A medicine to numb the area  (local anesthetic). ? A medicine to make you fall asleep (general anesthetic). This is rarely needed.  You will lie on your side with your knees bent.  The tube will: ? Have oil or gel put on it (be lubricated). ? Be inserted into your anus. ? Be gently eased through all parts of your large intestine.  Air will be sent into your colon to keep it open. This may cause some pressure or cramping.  Images will be taken with the camera and will appear on a screen.  A small tissue sample may be removed to be looked at under a microscope (biopsy). The tissue may be sent to a lab for testing if any signs of problems are found.  If small polyps are found, they may be removed and checked for cancer cells.  When the procedure is finished, the tube will be removed. The procedure may vary among health care providers and hospitals.   What happens after the procedure?  Your blood pressure, heart rate, breathing rate, and blood oxygen level will be monitored until you leave the hospital or clinic.  You may have a small amount of blood in your stool.  You may pass gas and have mild cramping or bloating in your abdomen. This is caused by the air that was used to open your colon during the exam.  Do not drive for 24 hours after the procedure.  It is up to you to get the results of your procedure. Ask your health care provider, or the department that is doing the procedure, when your results will be ready. Summary  A colonoscopy is a procedure to look at the entire large intestine.  Follow instructions from your health care provider about eating and drinking before the procedure.  If you were prescribed an oral bowel prep to clean out your colon, take it as told by your health care provider.  During the colonoscopy, a flexible tube with a camera on  its end is inserted into the anus and then passed into the other parts of the large intestine. This information is not intended to replace advice given  to you by your health care provider. Make sure you discuss any questions you have with your health care provider. Document Revised: 09/09/2018 Document Reviewed: 09/09/2018 Elsevier Patient Education  2021 [place].  Stool DNA Testing for Colon Cancer Why am I having this test? Colon cancer is one of the most common types of cancer and a leading cause of cancer-related death. Testing for colon cancer before symptoms develop (screening) can help to find the cancer early, which leads to a better chance for effective treatment. Stool DNA testing, also called the fecal immunochemical DNA test (FIT-DNA test), is one method of screening for colon cancer. All adults should have colon cancer screening starting at age 88 and continuing until age 43. After age 43, you may no longer need screening based on your health. Your health care provider may also recommend screening before age 38. You will have tests every 1-10 years, depending on your results and the type of screening test. Screening may include having stool DNA testing every 3 years if:  You have no symptoms of colon cancer. Symptoms include rectal bleeding, unexplained weight loss, and changes in bowel habits.  You have an average risk of colon cancer. Average risk means: ? You do not have precancerous polyps. These are abnormal growths that can become cancerous (malignant). ? You do not have family or personal history of either colon cancer or a colon disease that increases your risk for colon cancer. ? You do not have a personal history of other types of cancer. ? You do not have diabetes. This test may be done more often for people at high risk. What is being tested? For the test, a sample of the stool is checked for blood and changes in DNA that could lead to cancer. Growths in the colon bleed and shed cells if they are cancerous or precancerous. Blood and cells can be picked up by stool as it passes through the colon. This test checks for  blood cells as well as nine types of DNA (biomarkers) in three genes that have been linked to colon cancer and precancerous polyps. What kind of sample is taken? This test uses a stool sample that is collected when you have a bowel movement.   How do I collect samples at home? You will be sent a stool sample collection kit in the mail. The kit will include instructions and everything you need to get the sample. When collecting a stool (feces) sample at home, make sure you:  Use supplies and instructions that you received from the lab.  Have a bowel movement directly into a clean, dry container. Do not collect stool from the water in the toilet.  Transfer the sample into the germ-free (sterile) cup that you received from the lab.  Do not let any toilet paper or urine get into the cup.  Write your information on the label of the cup. Use ink that will not smear. To do this: ? Write your full legal name. Do not write a nickname. ? Write your date of birth. ? Write the date and time that you collected the sample.  Return the sample to the lab as told. The sample will be checked within about 3 days of when the lab received it. The results will be sent to your health care provider. How do I prepare  for this test? There is no preparation needed for this test. However, do not collect a stool sample if:  You have bleeding hemorrhoids. These are swollen veins in and around the rectum or the opening between the buttocks (anus).  You have any rectal bleeding.  You have a cut on your hand or finger.  You have your menstrual period.  You have diarrhea. How are the results reported? Your test results will be reported as either positive or negative. Sometimes, the test results may report that a condition is present when it is not present (false-positive result). Sometimes, the test results may report that a condition is not present when it is present (false-negative result). What do the results  mean?  A positive result means that the test found abnormal DNA or blood cells, or both. If you have a positive result, you will need to have a follow-up exam of your colon done with a scope (colonoscopy).  A negative result means that no blood or changes in DNA were found. This does not guarantee that you do not have colon cancer. Your health care provider may recommend that you have other screening tests. Talk with your health care provider about what your results mean. Ask if further testing needs to be done and ask about the timing of those tests. Questions to ask your health care provider Ask your health care provider, or the department that is doing the test:  When will my results be ready?  How will I get my results?  What are my treatment options?  What other tests do I need?  What are my next steps? Summary  Stool DNA testing, also called the FIT-DNA test, is one method of screening for colon cancer.  For the test, a sample of the stool (feces) is checked for blood and changes in DNA that could lead to cancer.  Do not collect astool sample if you have bleeding hemorrhoids, rectal bleeding, a cut on your hand or finger, your menstrual period, or diarrhea.  Your test results will be reported as either positive or negative. This information is not intended to replace advice given to you by your health care provider. Make sure you discuss any questions you have with your health care provider. Document Revised: 11/16/2019 Document Reviewed: 06/07/2019 Elsevier Patient Education  2021 [employer].

## 2020-04-15 NOTE — Progress Notes (Signed)
BP 113/78   Pulse 67   Temp 98.6 F (37 C) (Oral)   Ht '5\' 4"'  (1.626 m)   Wt 156 lb 3.2 oz (70.9 kg)   SpO2 98%   BMI 26.81 kg/m    Subjective:    Patient ID: Candice Robinson, female    DOB: 08-03-1970, 50 y.o.   MRN: [id number]  HPI: Candice Robinson is a 50 y.o. female presenting on 04/15/2020 for comprehensive medical examination. Current medical complaints include:  Subclinical HYPOTHYROIDISM Thyroid control status:stable Satisfied with current treatment? yes Fatigue: no Cold intolerance: no Heat intolerance: no Weight gain: no Weight loss: no Constipation: no Diarrhea/loose stools: no Palpitations: no Lower extremity edema: no Anxiety/depressed mood: no  Menopausal Symptoms: no  Depression Screen done today and results listed below:  Depression screen [facility] 2/9 04/15/2020 03/21/2020 08/12/2017 08/10/2016 05/16/2015  Decreased Interest 0 0 0 0 0  Down, Depressed, Hopeless 0 0 0 0 0  PHQ - 2 Score 0 0 0 0 0  Altered sleeping 0 - 1 - -  Tired, decreased energy 0 - 1 - -  Change in appetite 0 - 0 - -  Feeling bad or failure about yourself  0 - 0 - -  Trouble concentrating 0 - 0 - -  Moving slowly or fidgety/restless 0 - 0 - -  Suicidal thoughts 0 - - - -  PHQ-9 Score 0 - 2 - -  Difficult doing work/chores - - Not difficult at all - -    Past Medical History:  Past Medical History:  Diagnosis Date  . BRCA negative 01/2020   MyRisk neg  . Complication of anesthesia    ponv   . Family history of breast cancer 01/2020   IBIS=14.4%/riskscore=17.8%  . Kidney stone   . PONV (postoperative nausea and vomiting)    Requires phenergan for both procedures    Surgical History:  Past Surgical History:  Procedure Laterality Date  . CYSTOSCOPY WITH STENT PLACEMENT Right 12/03/2017   Procedure: CYSTOSCOPY WITH STENT PLACEMENT;  Surgeon: Billey Co, MD;  Location: [facility];  Service: Urology;  Laterality: Right;  . CYSTOSCOPY/URETEROSCOPY/HOLMIUM LASER/STENT  PLACEMENT Right 12/27/2017   Procedure: CYSTOSCOPY/URETEROSCOPY/HOLMIUM LASER/STENT Exchange;  Surgeon: Billey Co, MD;  Location: [facility];  Service: Urology;  Laterality: Right;  Large renal stone  . CYSTOSCOPY/URETEROSCOPY/HOLMIUM LASER/STENT PLACEMENT Right 01/07/2018   Procedure: CYSTOSCOPY/URETEROSCOPY/HOLMIUM LASER/STENT Exchange;  Surgeon: Billey Co, MD;  Location: [facility];  Service: Urology;  Laterality: Right;  Large renal stone  . HOLMIUM LASER APPLICATION Right 87/07/8113   Procedure: HOLMIUM LASER APPLICATION;  Surgeon: Billey Co, MD;  Location: [facility];  Service: Urology;  Laterality: Right;  . IR NEPHROSTOMY PLACEMENT RIGHT  12/03/2017  . NEPHROLITHOTOMY Right 12/03/2017   Procedure: NEPHROLITHOTOMY PERCUTANEOUS;  Surgeon: Billey Co, MD;  Location: [facility];  Service: Urology;  Laterality: Right;  . TUBAL LIGATION  1996  . tummy tuck  2004    Medications:  No current outpatient medications on file prior to visit.   No current facility-administered medications on file prior to visit.    Allergies:  Allergies  Allergen Reactions  . Bactrim [Sulfamethoxazole-Trimethoprim] Shortness Of Breath, Nausea Only and Other (See Comments)    Fatigue  . Codeine Other (See Comments)    Hallucinations  . Dilaudid [Hydromorphone Hcl] Nausea Only and Other (See Comments)    Extreme sweating  . Gluten Meal Other (See Comments)    Stomach bloating, indigestion  Social History:  Social History   Socioeconomic History  . Marital status: Married    Spouse name: richard  . Number of children: Not on file  . Years of education: Not on file  . Highest education level: Not on file  Occupational History  . Occupation: Librarian, academic  Tobacco Use  . Smoking status: Never Smoker  . Smokeless tobacco: Never Used  Vaping Use  . Vaping Use: Never used  Substance and Sexual Activity  . Alcohol use: Yes    Comment: socially. maybe 1 drink every 2 or 3 months  .  Drug use: No  . Sexual activity: Yes    Birth control/protection: Surgical  Other Topics Concern  . Not on file  Social History Narrative  . Not on file   Social Determinants of Health   Financial Resource Strain: Not on file  Food Insecurity: Not on file  Transportation Needs: Not on file  Physical Activity: Not on file  Stress: Not on file  Social Connections: Not on file  Intimate Partner Violence: Not on file   Social History   Tobacco Use  Smoking Status Never Smoker  Smokeless Tobacco Never Used   Social History   Substance and Sexual Activity  Alcohol Use Yes   Comment: socially. maybe 1 drink every 2 or 3 months    Family History:  Family History  Problem Relation Age of Onset  . Diabetes Mother   . Hyperlipidemia Mother   . Hypertension Mother   . Hyperlipidemia Father   . Hypertension Father   . Heart disease Father   . Dementia Father   . Lupus Sister   . Diabetes Sister   . Hypertension Sister   . Breast cancer Maternal Aunt        45-60  . Breast cancer Maternal Aunt        45-60  . Breast cancer Maternal Aunt        45-60  . Bladder Cancer Neg Hx   . Kidney cancer Neg Hx     Past medical history, surgical history, medications, allergies, family history and social history reviewed with patient today and changes made to appropriate areas of the chart.   Review of Systems  Constitutional: Negative.   HENT: Negative.   Eyes: Negative.   Respiratory: Negative.   Cardiovascular: Negative.   Gastrointestinal: Negative.   Genitourinary: Negative.   Musculoskeletal: Negative.   Skin: Negative.   Neurological: Negative.   Endo/Heme/Allergies: Positive for environmental allergies. Negative for polydipsia. Does not bruise/bleed easily.  Psychiatric/Behavioral: Negative.    All other ROS negative except what is listed above and in the HPI.      Objective:    BP 113/78   Pulse 67   Temp 98.6 F (37 C) (Oral)   Ht '5\' 4"'  (1.626 m)   Wt 156  lb 3.2 oz (70.9 kg)   SpO2 98%   BMI 26.81 kg/m   Wt Readings from Last 3 Encounters:  04/15/20 156 lb 3.2 oz (70.9 kg)  03/21/20 154 lb 9.6 oz (70.1 kg)  01/08/20 153 lb (69.4 kg)    Physical Exam Vitals and nursing note reviewed.  Constitutional:      General: She is not in acute distress.    Appearance: Normal appearance. She is not ill-appearing, toxic-appearing or diaphoretic.  HENT:     Head: Normocephalic and atraumatic.     Right Ear: Tympanic membrane, ear canal and external ear normal. There is no impacted cerumen.  Left Ear: Tympanic membrane, ear canal and external ear normal. There is no impacted cerumen.     Nose: Nose normal. No congestion or rhinorrhea.     Mouth/Throat:     Mouth: Mucous membranes are moist.     Pharynx: Oropharynx is clear. No oropharyngeal exudate or posterior oropharyngeal erythema.  Eyes:     General: No scleral icterus.       Right eye: No discharge.        Left eye: No discharge.     Extraocular Movements: Extraocular movements intact.     Conjunctiva/sclera: Conjunctivae normal.     Pupils: Pupils are equal, round, and reactive to light.  Neck:     Vascular: No carotid bruit.  Cardiovascular:     Rate and Rhythm: Normal rate and regular rhythm.     Pulses: Normal pulses.     Heart sounds: No murmur heard. No friction rub. No gallop.   Pulmonary:     Effort: Pulmonary effort is normal. No respiratory distress.     Breath sounds: Normal breath sounds. No stridor. No wheezing, rhonchi or rales.  Chest:     Chest wall: No tenderness.  Abdominal:     General: Abdomen is flat. Bowel sounds are normal. There is no distension.     Palpations: Abdomen is soft. There is no mass.     Tenderness: There is no abdominal tenderness. There is no right CVA tenderness, left CVA tenderness, guarding or rebound.     Hernia: No hernia is present.  Genitourinary:    Comments: Breast and pelvic exams deferred with shared decision  making Musculoskeletal:        General: No swelling, tenderness, deformity or signs of injury.     Cervical back: Normal range of motion and neck supple. No rigidity. No muscular tenderness.     Right lower leg: No edema.     Left lower leg: No edema.  Lymphadenopathy:     Cervical: No cervical adenopathy.  Skin:    General: Skin is warm and dry.     Capillary Refill: Capillary refill takes less than 2 seconds.     Coloration: Skin is not jaundiced or pale.     Findings: No bruising, erythema, lesion or rash.  Neurological:     General: No focal deficit present.     Mental Status: She is alert and oriented to person, place, and time. Mental status is at baseline.     Cranial Nerves: No cranial nerve deficit.     Sensory: No sensory deficit.     Motor: No weakness.     Coordination: Coordination normal.     Gait: Gait normal.     Deep Tendon Reflexes: Reflexes normal.  Psychiatric:        Mood and Affect: Mood normal.        Behavior: Behavior normal.        Thought Content: Thought content normal.        Judgment: Judgment normal.     Results for orders placed or performed in visit on 03/08/20  TSH + free T4  Result Value Ref Range   TSH 5.710 (H) 0.450 - 4.500 uIU/mL   Free T4 1.11 0.82 - 1.77 ng/dL      Assessment & Plan:   Problem List Items Addressed This Visit      Endocrine   Subclinical hypothyroidism    Rechecking labs today. Await results. Treat as needed.        Other  Visit Diagnoses    Routine general medical examination at a health care facility    -  Primary   Vaccines up to date. Screening labs checked today. Pap up to date. Mammogram and colon cancer screening discussed today. Continue diet and exercise.    Relevant Orders   CBC with Differential/Platelet   Comprehensive metabolic panel   Lipid Panel w/o Chol/HDL Ratio   Urinalysis, Routine w reflex microscopic   TSH   Hepatitis C Antibody   Bayer DCA Hb A1c Waived       Follow up  plan: Return in about 1 year (around 04/15/2021) for physical.   LABORATORY TESTING:  - Pap smear: up to date  IMMUNIZATIONS:   - Tdap: Tetanus vaccination status reviewed: last tetanus booster within 10 years. - Influenza: Refused - COVID: Up to date  SCREENING: -Mammogram: order in  - Colonoscopy: will consider options and let us know   PATIENT COUNSELING:   Advised to take 1 mg of folate supplement per day if capable of pregnancy.   Sexuality: Discussed sexually transmitted diseases, partner selection, use of condoms, avoidance of unintended pregnancy  and contraceptive alternatives.   Advised to avoid cigarette smoking.  I discussed with the patient that most people either abstain from alcohol or drink within safe limits (<=14/week and <=4 drinks/occasion for males, <=7/weeks and <= 3 drinks/occasion for females) and that the risk for alcohol disorders and other health effects rises proportionally with the number of drinks per week and how often a drinker exceeds daily limits.  Discussed cessation/primary prevention of drug use and availability of treatment for abuse.   Diet: Encouraged to adjust caloric intake to maintain  or achieve ideal body weight, to reduce intake of dietary saturated fat and total fat, to limit sodium intake by avoiding high sodium foods and not adding table salt, and to maintain adequate dietary potassium and calcium preferably from fresh fruits, vegetables, and low-fat dairy products.    stressed the importance of regular exercise  Injury prevention: Discussed safety belts, safety helmets, smoke detector, smoking near bedding or upholstery.   Dental health: Discussed importance of regular tooth brushing, flossing, and dental visits.    NEXT PREVENTATIVE PHYSICAL DUE IN 1 YEAR. Return in about 1 year (around 04/15/2021) for physical.

## 2020-04-16 LAB — MICROSCOPIC EXAMINATION: RBC: >30 /hpf — AB (ref 0–2)

## 2020-04-16 LAB — COMPREHENSIVE METABOLIC PANEL
ALT: 19 IU/L (ref 0–32)
AST: 20 IU/L (ref 0–40)
Albumin/Globulin Ratio: 1.4 (ref 1.2–2.2)
Albumin: 4.1 g/dL (ref 3.8–4.8)
Alkaline Phosphatase: 97 IU/L (ref 44–121)
BUN/Creatinine Ratio: 13 (ref 9–23)
BUN: 12 mg/dL (ref 6–24)
Bilirubin Total: <0.2 mg/dL (ref 0.0–1.2)
CO2: 24 mmol/L (ref 20–29)
Calcium: 9.8 mg/dL (ref 8.7–10.2)
Chloride: 101 mmol/L (ref 96–106)
Creatinine, Ser: 0.96 mg/dL (ref 0.57–1.00)
GFR calc Af Amer: 80 mL/min/{1.73_m2} (ref >59)
GFR calc non Af Amer: 70 mL/min/{1.73_m2} (ref >59)
Globulin, Total: 2.9 g/dL (ref 1.5–4.5)
Glucose: 88 mg/dL (ref 65–99)
Potassium: 4.3 mmol/L (ref 3.5–5.2)
Sodium: 139 mmol/L (ref 134–144)
Total Protein: 7 g/dL (ref 6.0–8.5)

## 2020-04-16 LAB — LIPID PANEL W/O CHOL/HDL RATIO
Cholesterol, Total: 218 mg/dL — ABNORMAL HIGH (ref 100–199)
HDL: 38 mg/dL — ABNORMAL LOW
LDL Chol Calc (NIH): 127 mg/dL — ABNORMAL HIGH (ref 0–99)
Triglycerides: 298 mg/dL — ABNORMAL HIGH (ref 0–149)
VLDL Cholesterol Cal: 53 mg/dL — ABNORMAL HIGH (ref 5–40)

## 2020-04-16 LAB — CBC WITH DIFFERENTIAL/PLATELET
Basophils Absolute: 0.1 x10E3/uL (ref 0.0–0.2)
Basos: 1 %
EOS (ABSOLUTE): 0.1 x10E3/uL (ref 0.0–0.4)
Eos: 2 %
Hematocrit: 41.7 % (ref 34.0–46.6)
Hemoglobin: 13.2 g/dL (ref 11.1–15.9)
Immature Grans (Abs): 0 x10E3/uL (ref 0.0–0.1)
Immature Granulocytes: 0 %
Lymphocytes Absolute: 3 x10E3/uL (ref 0.7–3.1)
Lymphs: 36 %
MCH: 23.7 pg — ABNORMAL LOW (ref 26.6–33.0)
MCHC: 31.7 g/dL (ref 31.5–35.7)
MCV: 75 fL — ABNORMAL LOW (ref 79–97)
Monocytes Absolute: 0.5 x10E3/uL (ref 0.1–0.9)
Monocytes: 7 %
Neutrophils Absolute: 4.6 x10E3/uL (ref 1.4–7.0)
Neutrophils: 54 %
Platelets: 353 x10E3/uL (ref 150–450)
RBC: 5.58 x10E6/uL — ABNORMAL HIGH (ref 3.77–5.28)
RDW: 16.9 % — ABNORMAL HIGH (ref 11.7–15.4)
WBC: 8.3 x10E3/uL (ref 3.4–10.8)

## 2020-04-16 LAB — URINALYSIS, ROUTINE W REFLEX MICROSCOPIC
Bilirubin, UA: NEGATIVE
Glucose, UA: NEGATIVE
Ketones, UA: NEGATIVE
Nitrite, UA: NEGATIVE
Protein,UA: NEGATIVE
Specific Gravity, UA: 1.025 (ref 1.005–1.030)
Urobilinogen, Ur: 0.2 mg/dL (ref 0.2–1.0)
pH, UA: 6 (ref 5.0–7.5)

## 2020-04-16 LAB — TSH: TSH: 3.48 u[IU]/mL (ref 0.450–4.500)

## 2020-04-16 LAB — BAYER DCA HB A1C WAIVED: HB A1C (BAYER DCA - WAIVED): 5 %

## 2020-04-16 LAB — HEPATITIS C ANTIBODY: Hep C Virus Ab: <0.1 s/co ratio (ref 0.0–0.9)

## 2020-04-26 ENCOUNTER — Telehealth: Payer: Self-pay

## 2020-04-26 NOTE — Telephone Encounter (Signed)
Patient's form for work completed and signed by Dr. Laural Benes. Called and let patient know that it was ready to be picked up.

## 2020-09-12 IMAGING — US US RENAL
1 series · 14 of 25 positions shown · non-contrast
Comparison: Prior CT from 12/10/2017

CLINICAL DATA: Initial evaluation for right-sided flank pain and
hydronephrosis

EXAM:
RENAL / URINARY TRACT ULTRASOUND COMPLETE

[Series 1: us renal · 0.26mm/px · 14 of 57 slices shown]
[im 1/57]
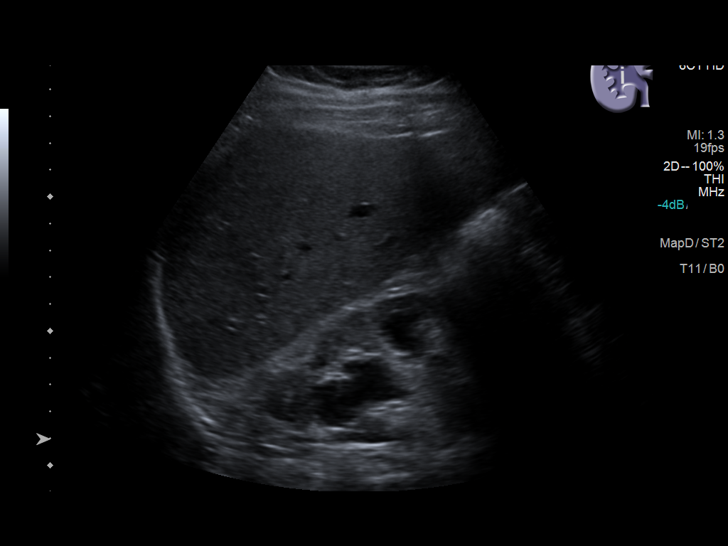
[im 5/57]
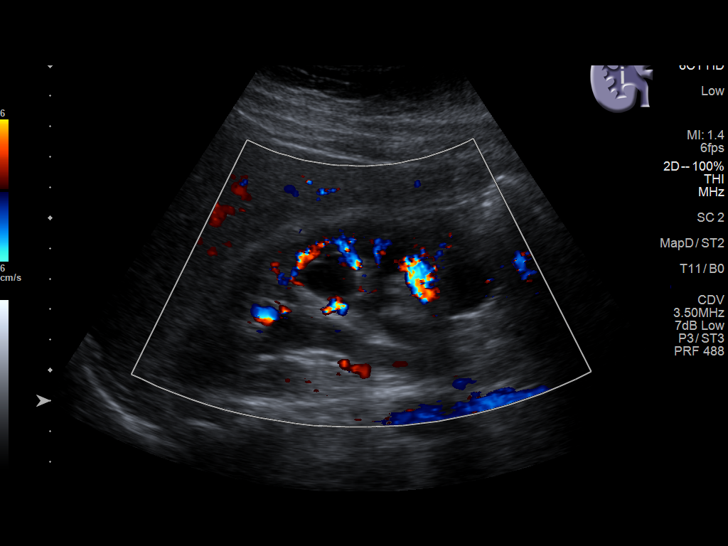
[im 10/57]
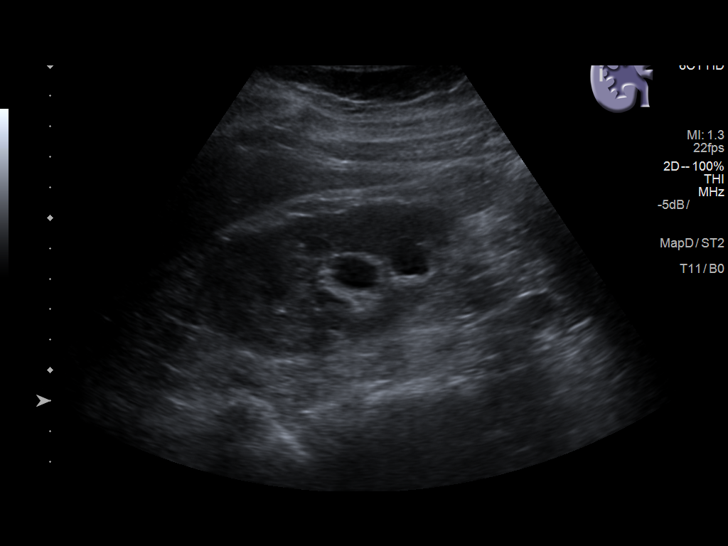
[im 15/57]
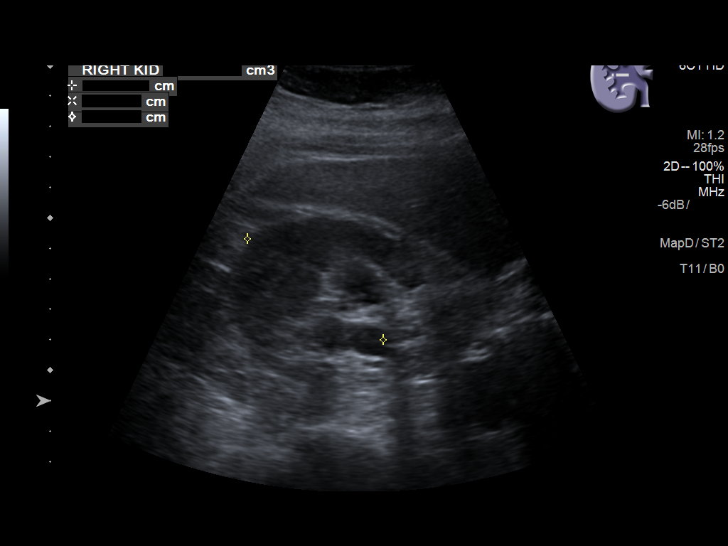
[im 19/57]
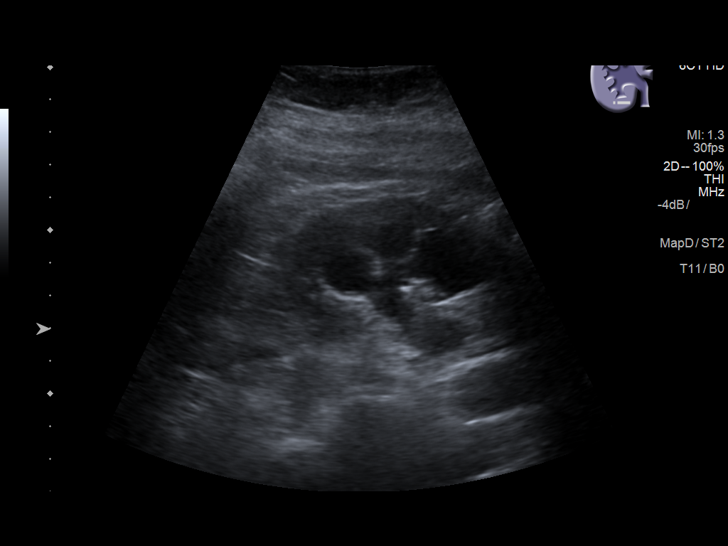
[im 22/57]
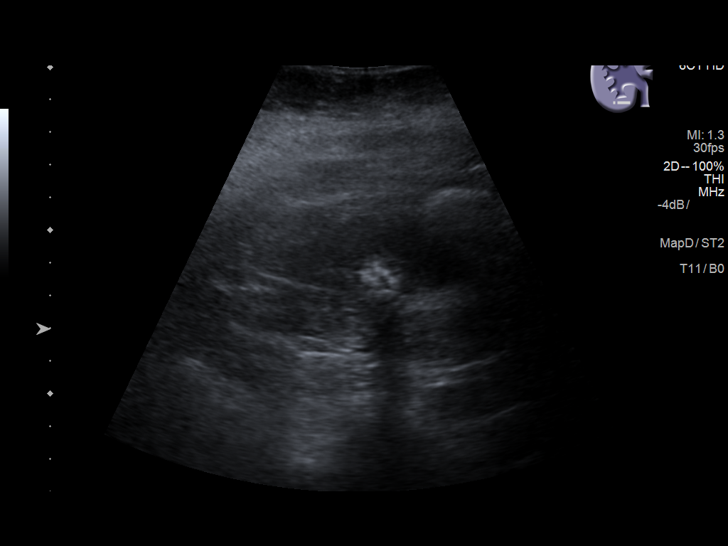
[im 26/57]
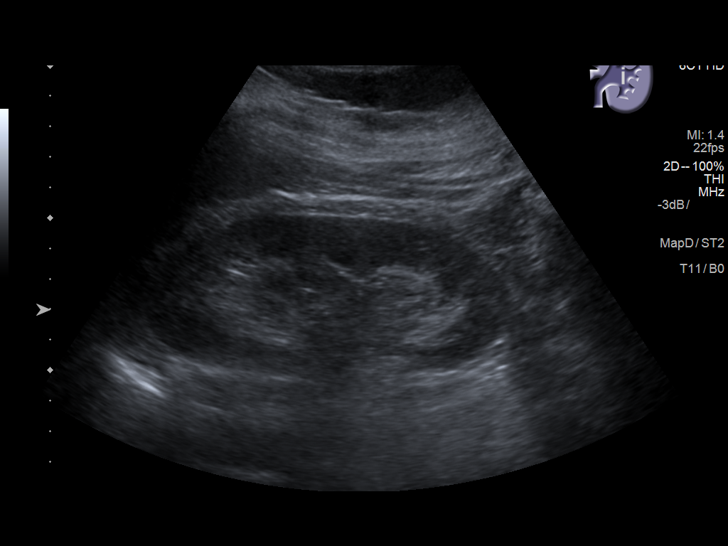
[im 31/57]
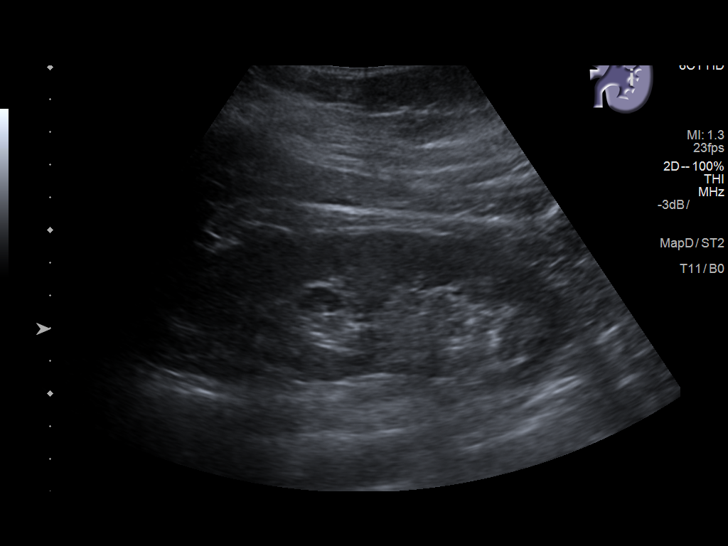
[im 36/57]
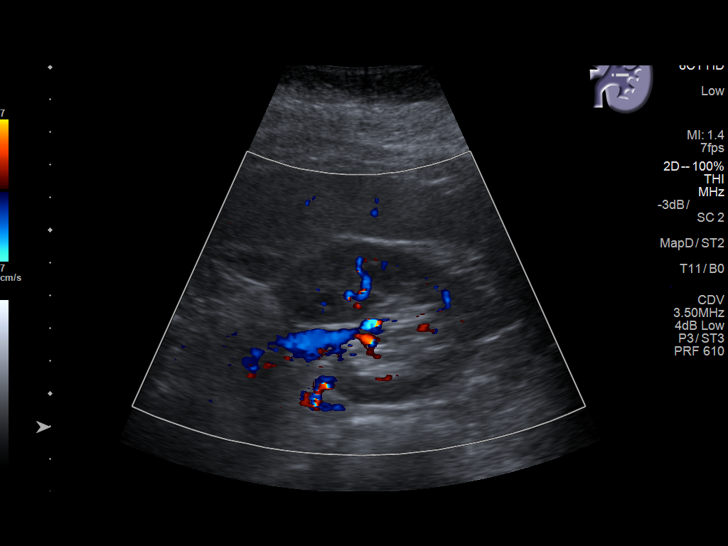
[im 38/57]
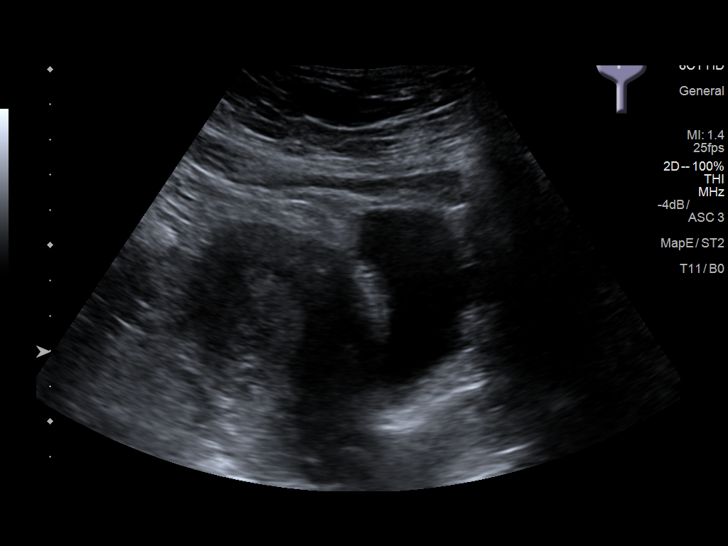
[im 43/57]
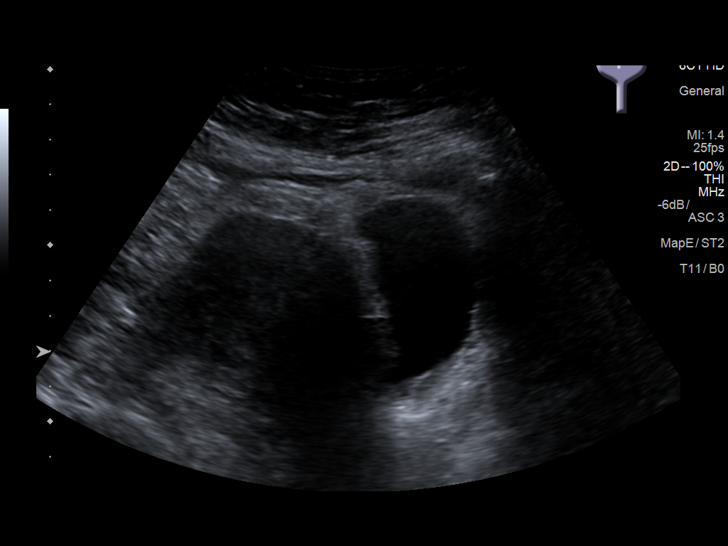
[im 47/57]
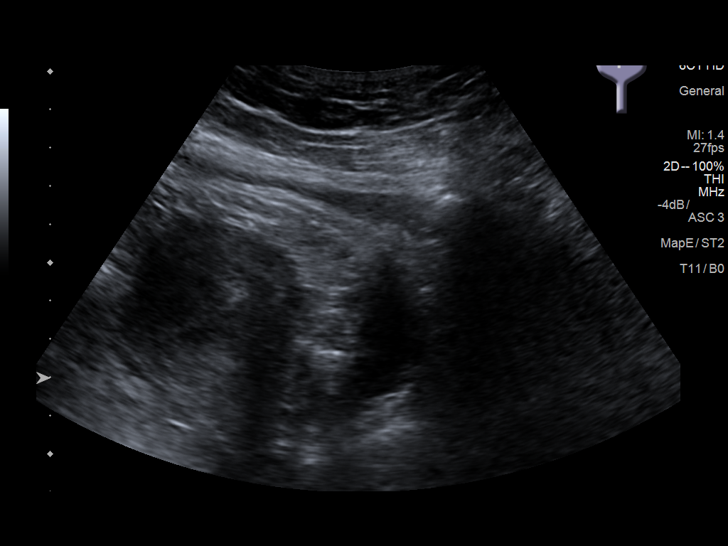
[im 52/57]
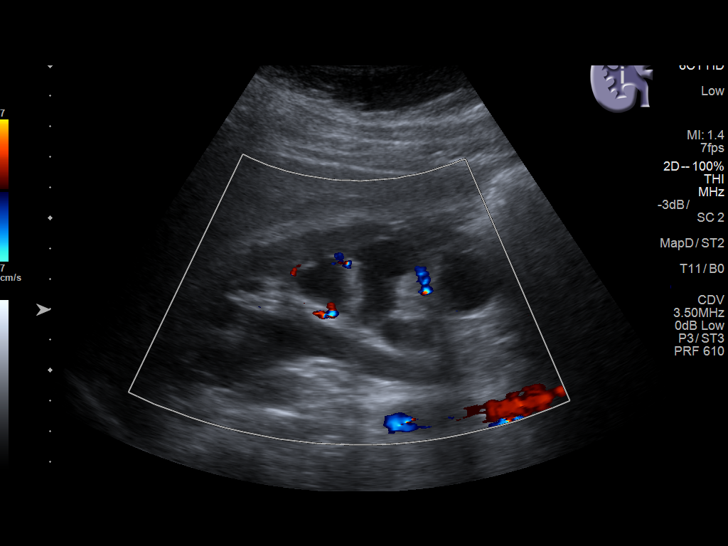
[im 57/57]
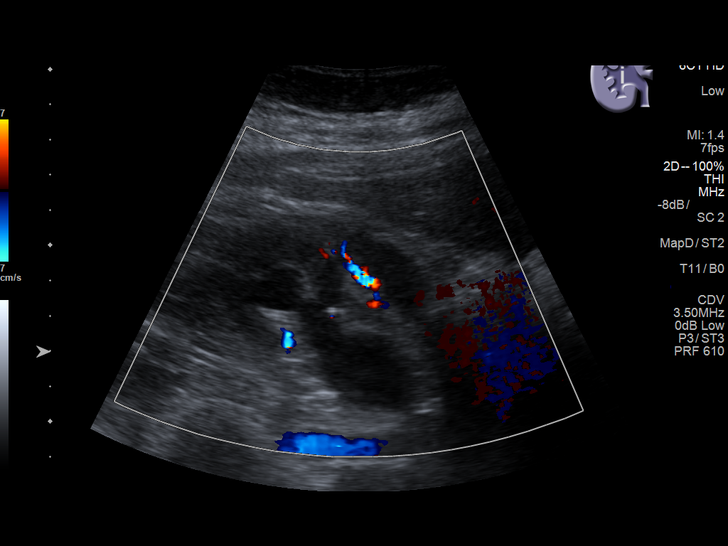

[14 of 25 positions shown; findings below may reference images not displayed]

FINDINGS: Right Kidney:

Renal measurements: 12.0 x 4.6 x 5.6 cm = volume: 160.1 mL .
Echogenicity within normal limits. No mass lesion. Few shadowing
calculi present, largest of which position within the lower pole and
measures 13 mm. Moderate right-sided hydronephrosis.

Left Kidney:

Renal measurements: 12.3 x 5.0 x 5.3 cm = volume: 169.4 mL.
Echogenicity within normal limits. No mass or hydronephrosis
visualized.

Bladder:

Appears normal for degree of bladder distention. Bilateral ureteral
jets visualized.
IMPRESSION: 1. Moderate right-sided hydronephrosis.
2. Nonobstructive right renal nephrolithiasis measuring up to 13 mm.
3. Normal left kidney.

## 2020-09-18 ENCOUNTER — Ambulatory Visit: Payer: Commercial Managed Care - PPO | Admitting: Family Medicine

## 2020-09-27 ENCOUNTER — Other Ambulatory Visit: Payer: Self-pay

## 2020-09-27 ENCOUNTER — Ambulatory Visit: Payer: Commercial Managed Care - PPO | Admitting: Family Medicine

## 2020-09-27 ENCOUNTER — Encounter: Payer: Self-pay | Admitting: Family Medicine

## 2020-09-27 VITALS — BP 137/91 | HR 73 | Temp 98.8°F | Ht 63.94 in | Wt 168.5 lb

## 2020-09-27 DIAGNOSIS — Z1152 Encounter for screening for COVID-19: Secondary | ICD-10-CM | POA: Diagnosis not present

## 2020-09-27 DIAGNOSIS — E038 Other specified hypothyroidism: Secondary | ICD-10-CM | POA: Diagnosis not present

## 2020-09-27 NOTE — Assessment & Plan Note (Signed)
Has gained weight. Will check labs today. Await results. Treat as needed.

## 2020-09-27 NOTE — Progress Notes (Signed)
BP (!) 137/91   Pulse 73   Temp 98.8 F (37.1 C)   Ht 5' 3.94" (1.624 m)   Wt 168 lb 8 oz (76.4 kg)   SpO2 98%   BMI 28.98 kg/m    Subjective:    Patient ID: Candice Robinson, female    DOB: Jun 23, 1970, 50 y.o.   MRN: [id number]  HPI: Candice Robinson is a 50 y.o. female  Chief Complaint  Patient presents with   Thyroid Problem    HYPOTHYROIDISM Thyroid control status:stable Satisfied with current treatment?  unsure Medication side effects: not on anything Fatigue: no Cold intolerance: no Heat intolerance: no Weight gain: yes Weight loss: no Constipation: no Diarrhea/loose stools: no Palpitations: no Lower extremity edema: no Anxiety/depressed mood: no   Relevant past medical, surgical, family and social history reviewed and updated as indicated. Interim medical history since our last visit reviewed. Allergies and medications reviewed and updated.  Review of Systems  Constitutional: Negative.   Respiratory: Negative.    Cardiovascular: Negative.   Gastrointestinal: Negative.   Musculoskeletal: Negative.   Psychiatric/Behavioral: Negative.     Per HPI unless specifically indicated above     Objective:    BP (!) 137/91   Pulse 73   Temp 98.8 F (37.1 C)   Ht 5' 3.94" (1.624 m)   Wt 168 lb 8 oz (76.4 kg)   SpO2 98%   BMI 28.98 kg/m   Wt Readings from Last 3 Encounters:  09/27/20 168 lb 8 oz (76.4 kg)  04/15/20 156 lb 3.2 oz (70.9 kg)  03/21/20 154 lb 9.6 oz (70.1 kg)    Physical Exam Vitals and nursing note reviewed.  Constitutional:      General: She is not in acute distress.    Appearance: Normal appearance. She is not ill-appearing, toxic-appearing or diaphoretic.  HENT:     Head: Normocephalic and atraumatic.     Right Ear: External ear normal.     Left Ear: External ear normal.     Nose: Nose normal.     Mouth/Throat:     Mouth: Mucous membranes are moist.     Pharynx: Oropharynx is clear.  Eyes:     General: No scleral icterus.        Right eye: No discharge.        Left eye: No discharge.     Extraocular Movements: Extraocular movements intact.     Conjunctiva/sclera: Conjunctivae normal.     Pupils: Pupils are equal, round, and reactive to light.  Cardiovascular:     Rate and Rhythm: Normal rate and regular rhythm.     Pulses: Normal pulses.     Heart sounds: Normal heart sounds. No murmur heard.   No friction rub. No gallop.  Pulmonary:     Effort: Pulmonary effort is normal. No respiratory distress.     Breath sounds: Normal breath sounds. No stridor. No wheezing, rhonchi or rales.  Chest:     Chest wall: No tenderness.  Musculoskeletal:        General: Normal range of motion.     Cervical back: Normal range of motion and neck supple.  Skin:    General: Skin is warm and dry.     Capillary Refill: Capillary refill takes less than 2 seconds.     Coloration: Skin is not jaundiced or pale.     Findings: No bruising, erythema, lesion or rash.  Neurological:     General: No focal deficit present.  Mental Status: She is alert and oriented to person, place, and time. Mental status is at baseline.  Psychiatric:        Mood and Affect: Mood normal.        Behavior: Behavior normal.        Thought Content: Thought content normal.        Judgment: Judgment normal.    Results for orders placed or performed in visit on 04/15/20  Microscopic Examination   Urine  Result Value Ref Range   WBC, UA 0-5 0 - 5 /hpf   RBC >30 (A) 0 - 2 /hpf   Epithelial Cells (non renal) 0-10 0 - 10 /hpf   Bacteria, UA Few (A) None seen/Few  CBC with Differential/Platelet  Result Value Ref Range   WBC 8.3 3.4 - 10.8 x10E3/uL   RBC 5.58 (H) 3.77 - 5.28 x10E6/uL   Hemoglobin 13.2 11.1 - 15.9 g/dL   Hematocrit 24.4 01.0 - 46.6 %   MCV 75 (L) 79 - 97 fL   MCH 23.7 (L) 26.6 - 33.0 pg   MCHC 31.7 31.5 - 35.7 g/dL   RDW 27.2 (H) 53.6 - 64.4 %   Platelets 353 150 - 450 x10E3/uL   Neutrophils 54 Not Estab. %   Lymphs 36 Not  Estab. %   Monocytes 7 Not Estab. %   Eos 2 Not Estab. %   Basos 1 Not Estab. %   Neutrophils Absolute 4.6 1.4 - 7.0 x10E3/uL   Lymphocytes Absolute 3.0 0.7 - 3.1 x10E3/uL   Monocytes Absolute 0.5 0.1 - 0.9 x10E3/uL   EOS (ABSOLUTE) 0.1 0.0 - 0.4 x10E3/uL   Basophils Absolute 0.1 0.0 - 0.2 x10E3/uL   Immature Granulocytes 0 Not Estab. %   Immature Grans (Abs) 0.0 0.0 - 0.1 x10E3/uL  Comprehensive metabolic panel  Result Value Ref Range   Glucose 88 65 - 99 mg/dL   BUN 12 6 - 24 mg/dL   Creatinine, Ser 0.34 0.57 - 1.00 mg/dL   GFR calc non Af Amer 70 >59 mL/min/1.73   GFR calc Af Amer 80 >59 mL/min/1.73   BUN/Creatinine Ratio 13 9 - 23   Sodium 139 134 - 144 mmol/L   Potassium 4.3 3.5 - 5.2 mmol/L   Chloride 101 96 - 106 mmol/L   CO2 24 20 - 29 mmol/L   Calcium 9.8 8.7 - 10.2 mg/dL   Total Protein 7.0 6.0 - 8.5 g/dL   Albumin 4.1 3.8 - 4.8 g/dL   Globulin, Total 2.9 1.5 - 4.5 g/dL   Albumin/Globulin Ratio 1.4 1.2 - 2.2   Bilirubin Total <0.2 0.0 - 1.2 mg/dL   Alkaline Phosphatase 97 44 - 121 IU/L   AST 20 0 - 40 IU/L   ALT 19 0 - 32 IU/L  Lipid Panel w/o Chol/HDL Ratio  Result Value Ref Range   Cholesterol, Total 218 (H) 100 - 199 mg/dL   Triglycerides 742 (H) 0 - 149 mg/dL   HDL 38 (L) >59 mg/dL   VLDL Cholesterol Cal 53 (H) 5 - 40 mg/dL   LDL Chol Calc (NIH) 563 (H) 0 - 99 mg/dL  Urinalysis, Routine w reflex microscopic  Result Value Ref Range   Specific Gravity, UA 1.025 1.005 - 1.030   pH, UA 6.0 5.0 - 7.5   Color, UA Yellow Yellow   Appearance Ur Clear Clear   Leukocytes,UA Trace (A) Negative   Protein,UA Negative Negative/Trace   Glucose, UA Negative Negative   Ketones, UA Negative Negative  RBC, UA 3+ (A) Negative   Bilirubin, UA Negative Negative   Urobilinogen, Ur 0.2 0.2 - 1.0 mg/dL   Nitrite, UA Negative Negative   Microscopic Examination See below:   TSH  Result Value Ref Range   TSH 3.480 0.450 - 4.500 uIU/mL  Hepatitis C Antibody  Result Value  Ref Range   Hep C Virus Ab <0.1 0.0 - 0.9 s/co ratio  Bayer DCA Hb A1c Waived  Result Value Ref Range   HB A1C (BAYER DCA - WAIVED) 5.0 <7.0 %      Assessment & Plan:   Problem List Items Addressed This Visit       Endocrine   Subclinical hypothyroidism - Primary    Has gained weight. Will check labs today. Await results. Treat as needed.        Relevant Orders   TSH   Other Visit Diagnoses     Encounter for screening for COVID-19       COVID antibodies checked today.   Relevant Orders   SARS-CoV-2 Semi-Quantitative Total Antibody, Spike        Follow up plan: Return in about 6 months (around 03/30/2021) for physical.

## 2020-09-28 LAB — TSH: TSH: 4.66 u[IU]/mL — ABNORMAL HIGH (ref 0.450–4.500)

## 2020-09-30 LAB — SARS-COV-2 SEMI-QUANTITATIVE TOTAL ANTIBODY, SPIKE: SARS-CoV-2 Spike Ab Interp: POSITIVE

## 2020-09-30 LAB — SARS-COV-2 SPIKE AB DILUTION: SARS-CoV-2 Spike Ab Dilution: 2563 U/mL (ref <0.8)

## 2021-04-17 ENCOUNTER — Encounter: Payer: Self-pay | Admitting: Family Medicine

## 2021-04-17 ENCOUNTER — Ambulatory Visit (INDEPENDENT_AMBULATORY_CARE_PROVIDER_SITE_OTHER): Payer: Commercial Managed Care - PPO | Admitting: Family Medicine

## 2021-04-17 ENCOUNTER — Other Ambulatory Visit: Payer: Self-pay

## 2021-04-17 VITALS — BP 132/76 | HR 82 | Temp 98.2°F | Ht 63.0 in | Wt 170.0 lb

## 2021-04-17 DIAGNOSIS — E038 Other specified hypothyroidism: Secondary | ICD-10-CM

## 2021-04-17 DIAGNOSIS — Z0000 Encounter for general adult medical examination without abnormal findings: Secondary | ICD-10-CM

## 2021-04-17 DIAGNOSIS — Z1211 Encounter for screening for malignant neoplasm of colon: Secondary | ICD-10-CM | POA: Diagnosis not present

## 2021-04-17 DIAGNOSIS — Z1231 Encounter for screening mammogram for malignant neoplasm of breast: Secondary | ICD-10-CM | POA: Diagnosis not present

## 2021-04-17 LAB — URINALYSIS, ROUTINE W REFLEX MICROSCOPIC
Bilirubin, UA: NEGATIVE
Glucose, UA: NEGATIVE
Ketones, UA: NEGATIVE
Nitrite, UA: NEGATIVE
Protein,UA: NEGATIVE
RBC, UA: NEGATIVE
Specific Gravity, UA: 1.01 (ref 1.005–1.030)
Urobilinogen, Ur: 0.2 mg/dL (ref 0.2–1.0)
pH, UA: 6.5 (ref 5.0–7.5)

## 2021-04-17 LAB — MICROSCOPIC EXAMINATION: Bacteria, UA: NONE SEEN

## 2021-04-17 LAB — BAYER DCA HB A1C WAIVED: HB A1C (BAYER DCA - WAIVED): 4.2 % — ABNORMAL LOW (ref 4.8–5.6)

## 2021-04-17 NOTE — Progress Notes (Signed)
BP 132/76    Pulse 82    Temp 98.2 F (36.8 C)    Ht _0  (1.6 m)    Wt 170 lb (77.1 kg)    SpO2 98%    BMI 30.11 kg/m    Subjective:    Patient ID: Candice Robinson, female    DOB: Jun 29, 1970, 51 y.o.   MRN: [id number]  HPI: Candice Robinson is a 51 y.o. female presenting on 04/17/2021 for comprehensive medical examination. Current medical complaints include:  SUBCLINICAL HYPOTHYROIDISM Thyroid control status:stable Satisfied with current treatment? yes Medication side effects: not on anything Fatigue: no Cold intolerance: no Heat intolerance: no Weight gain: no Weight loss: no Constipation: no Diarrhea/loose stools: no Palpitations: no Lower extremity edema: no Anxiety/depressed mood: no  Menopausal Symptoms: no  Depression Screen done today and results listed below:  Depression screen [facility] 2/9 04/17/2021 09/27/2020 04/15/2020 03/21/2020 08/12/2017  Decreased Interest 0 0 0 0 0  Down, Depressed, Hopeless 0 0 0 0 0  PHQ - 2 Score 0 0 0 0 0  Altered sleeping 0 - 0 - 1  Tired, decreased energy 0 - 0 - 1  Change in appetite 0 - 0 - 0  Feeling bad or failure about yourself  0 - 0 - 0  Trouble concentrating 0 - 0 - 0  Moving slowly or fidgety/restless 0 - 0 - 0  Suicidal thoughts 0 - 0 - -  PHQ-9 Score 0 - 0 - 2  Difficult doing work/chores - - - - Not difficult at all   Past Medical History:  Past Medical History:  Diagnosis Date   BRCA negative 01/2020   MyRisk neg   Complication of anesthesia    ponv    Family history of breast cancer 01/2020   IBIS=14.4%/riskscore=17.8%   Kidney stone    PONV (postoperative nausea and vomiting)    Requires phenergan for both procedures    Surgical History:  Past Surgical History:  Procedure Laterality Date   CYSTOSCOPY WITH STENT PLACEMENT Right 12/03/2017   Procedure: CYSTOSCOPY WITH STENT PLACEMENT;  Surgeon: Billey Co, MD;  Location: [facility];  Service: Urology;  Laterality: Right;   CYSTOSCOPY/URETEROSCOPY/HOLMIUM  LASER/STENT PLACEMENT Right 12/27/2017   Procedure: CYSTOSCOPY/URETEROSCOPY/HOLMIUM LASER/STENT Exchange;  Surgeon: Billey Co, MD;  Location: [facility];  Service: Urology;  Laterality: Right;  Large renal stone   CYSTOSCOPY/URETEROSCOPY/HOLMIUM LASER/STENT PLACEMENT Right 01/07/2018   Procedure: CYSTOSCOPY/URETEROSCOPY/HOLMIUM LASER/STENT Exchange;  Surgeon: Billey Co, MD;  Location: [facility];  Service: Urology;  Laterality: Right;  Large renal stone   HOLMIUM LASER APPLICATION Right 92/03/1939   Procedure: HOLMIUM LASER APPLICATION;  Surgeon: Billey Co, MD;  Location: [facility];  Service: Urology;  Laterality: Right;   IR NEPHROSTOMY PLACEMENT RIGHT  12/03/2017   NEPHROLITHOTOMY Right 12/03/2017   Procedure: NEPHROLITHOTOMY PERCUTANEOUS;  Surgeon: Billey Co, MD;  Location: [facility];  Service: Urology;  Laterality: Right;   TUBAL LIGATION  1996   tummy tuck  2004    Medications:  No current outpatient medications on file prior to visit.   No current facility-administered medications on file prior to visit.    Allergies:  Allergies  Allergen Reactions   Bactrim [Sulfamethoxazole-Trimethoprim] Shortness Of Breath, Nausea Only and Other (See Comments)    Fatigue   Codeine Other (See Comments)    Hallucinations   Dilaudid [Hydromorphone Hcl] Nausea Only and Other (See Comments)    Extreme sweating   Gluten Meal Other (See Comments)  Stomach bloating, indigestion     Social History:  Social History   Socioeconomic History   Marital status: Married    Spouse name: richard   Number of children: Not on file   Years of education: Not on file   Highest education level: Not on file  Occupational History   Occupation: Librarian, academic  Tobacco Use   Smoking status: Never   Smokeless tobacco: Never  Vaping Use   Vaping Use: Never used  Substance and Sexual Activity   Alcohol use: Yes    Comment: socially. maybe 1 drink every 2 or 3 months   Drug use: No    Sexual activity: Yes    Birth control/protection: Surgical  Other Topics Concern   Not on file  Social History Narrative   Not on file   Social Determinants of Health   Financial Resource Strain: Not on file  Food Insecurity: Not on file  Transportation Needs: Not on file  Physical Activity: Not on file  Stress: Not on file  Social Connections: Not on file  Intimate Partner Violence: Not on file   Social History   Tobacco Use  Smoking Status Never  Smokeless Tobacco Never   Social History   Substance and Sexual Activity  Alcohol Use Yes   Comment: socially. maybe 1 drink every 2 or 3 months    Family History:  Family History  Problem Relation Age of Onset   Diabetes Mother    Hyperlipidemia Mother    Hypertension Mother    Hyperlipidemia Father    Hypertension Father    Heart disease Father    Dementia Father    Frontotemporal dementia Father    Lupus Sister    Diabetes Sister    Hypertension Sister    Breast cancer Maternal Aunt        74-60   Breast cancer Maternal Aunt        103-60   Breast cancer Maternal Aunt        45-60   Bladder Cancer Neg Hx    Kidney cancer Neg Hx     Past medical history, surgical history, medications, allergies, family history and social history reviewed with patient today and changes made to appropriate areas of the chart.   Review of Systems  Constitutional: Negative.   HENT: Negative.    Eyes: Negative.   Respiratory: Negative.    Cardiovascular: Negative.   Gastrointestinal: Negative.   Genitourinary: Negative.   Musculoskeletal: Negative.   Skin: Negative.   Neurological: Negative.   Endo/Heme/Allergies: Negative.   Psychiatric/Behavioral: Negative.    All other ROS negative except what is listed above and in the HPI.      Objective:    BP 132/76    Pulse 82    Temp 98.2 F (36.8 C)    Ht _0  (1.6 m)    Wt 170 lb (77.1 kg)    SpO2 98%    BMI 30.11 kg/m   Wt Readings from Last 3 Encounters:  04/17/21 170  lb (77.1 kg)  09/27/20 168 lb 8 oz (76.4 kg)  04/15/20 156 lb 3.2 oz (70.9 kg)    Physical Exam Vitals and nursing note reviewed.  Constitutional:      General: She is not in acute distress.    Appearance: Normal appearance. She is not ill-appearing, toxic-appearing or diaphoretic.  HENT:     Head: Normocephalic and atraumatic.     Right Ear: Tympanic membrane, ear canal and external ear normal. There is  no impacted cerumen.     Left Ear: Tympanic membrane, ear canal and external ear normal. There is no impacted cerumen.     Nose: Nose normal. No congestion or rhinorrhea.     Mouth/Throat:     Mouth: Mucous membranes are moist.     Pharynx: Oropharynx is clear. No oropharyngeal exudate or posterior oropharyngeal erythema.  Eyes:     General: No scleral icterus.       Right eye: No discharge.        Left eye: No discharge.     Extraocular Movements: Extraocular movements intact.     Conjunctiva/sclera: Conjunctivae normal.     Pupils: Pupils are equal, round, and reactive to light.  Neck:     Vascular: No carotid bruit.  Cardiovascular:     Rate and Rhythm: Normal rate and regular rhythm.     Pulses: Normal pulses.     Heart sounds: No murmur heard.   No friction rub. No gallop.  Pulmonary:     Effort: Pulmonary effort is normal. No respiratory distress.     Breath sounds: Normal breath sounds. No stridor. No wheezing, rhonchi or rales.  Chest:     Chest wall: No tenderness.  Abdominal:     General: Abdomen is flat. Bowel sounds are normal. There is no distension.     Palpations: Abdomen is soft. There is no mass.     Tenderness: There is no abdominal tenderness. There is no right CVA tenderness, left CVA tenderness, guarding or rebound.     Hernia: No hernia is present.  Genitourinary:    Comments: Breast and pelvic exams deferred with shared decision making Musculoskeletal:        General: No swelling, tenderness, deformity or signs of injury.     Cervical back: Normal  range of motion and neck supple. No rigidity. No muscular tenderness.     Right lower leg: No edema.     Left lower leg: No edema.  Lymphadenopathy:     Cervical: No cervical adenopathy.  Skin:    General: Skin is warm and dry.     Capillary Refill: Capillary refill takes less than 2 seconds.     Coloration: Skin is not jaundiced or pale.     Findings: No bruising, erythema, lesion or rash.  Neurological:     General: No focal deficit present.     Mental Status: She is alert and oriented to person, place, and time. Mental status is at baseline.     Cranial Nerves: No cranial nerve deficit.     Sensory: No sensory deficit.     Motor: No weakness.     Coordination: Coordination normal.     Gait: Gait normal.     Deep Tendon Reflexes: Reflexes normal.  Psychiatric:        Mood and Affect: Mood normal.        Behavior: Behavior normal.        Thought Content: Thought content normal.        Judgment: Judgment normal.    Results for orders placed or performed in visit on 04/17/21  Microscopic Examination   Urine  Result Value Ref Range   WBC, UA 0-5 0 - 5 /hpf   RBC 0-2 0 - 2 /hpf   Epithelial Cells (non renal) 0-10 0 - 10 /hpf   Bacteria, UA None seen None seen/Few  Urinalysis, Routine w reflex microscopic  Result Value Ref Range   Specific Gravity, UA 1.010 1.005 -  1.030   pH, UA 6.5 5.0 - 7.5   Color, UA Yellow Yellow   Appearance Ur Clear Clear   Leukocytes,UA Trace (A) Negative   Protein,UA Negative Negative/Trace   Glucose, UA Negative Negative   Ketones, UA Negative Negative   RBC, UA Negative Negative   Bilirubin, UA Negative Negative   Urobilinogen, Ur 0.2 0.2 - 1.0 mg/dL   Nitrite, UA Negative Negative   Microscopic Examination See below:   Bayer DCA Hb A1c Waived  Result Value Ref Range   HB A1C (BAYER DCA - WAIVED) 4.2 (L) 4.8 - 5.6 %      Assessment & Plan:   Problem List Items Addressed This Visit       Endocrine   Subclinical hypothyroidism     Labs drawn today. Await results. Treat as needed.       Relevant Orders   TSH   Other Visit Diagnoses     Routine general medical examination at a health care facility    -  Primary   Vaccines up to date. Screening labs checked today. Pap up to date. Mammo and cologuard ordered today. Continue diet and exercise. Call with any concerns.    Relevant Orders   CBC with Differential/Platelet   Comprehensive metabolic panel   Lipid Panel w/o Chol/HDL Ratio   Urinalysis, Routine w reflex microscopic (Completed)   TSH   Bayer DCA Hb A1c Waived (Completed)   Encounter for screening mammogram for malignant neoplasm of breast       Mammogram ordered today.   Relevant Orders   MM 3D SCREEN BREAST BILATERAL   Screening for colon cancer       Cologuard ordered today.   Relevant Orders   Cologuard        Follow up plan: Return in about 1 year (around 04/17/2022) for physical.   LABORATORY TESTING:  - Pap smear: up to date  IMMUNIZATIONS:   - Tdap: Tetanus vaccination status reviewed: last tetanus booster within 10 years. - Influenza: Refused - Pneumovax: Not applicable - Prevnar: Not applicable - COVID: Up to date - HPV: Not applicable - Shingrix vaccine: Refused  SCREENING: -Mammogram: Ordered today  - Colonoscopy: cologuard ordered today   PATIENT COUNSELING:   Advised to take 1 mg of folate supplement per day if capable of pregnancy.   Sexuality: Discussed sexually transmitted diseases, partner selection, use of condoms, avoidance of unintended pregnancy  and contraceptive alternatives.   Advised to avoid cigarette smoking.  I discussed with the patient that most people either abstain from alcohol or drink within safe limits (<=14/week and <=4 drinks/occasion for males, <=7/weeks and <= 3 drinks/occasion for females) and that the risk for alcohol disorders and other health effects rises proportionally with the number of drinks per week and how often a drinker exceeds daily  limits.  Discussed cessation/primary prevention of drug use and availability of treatment for abuse.   Diet: Encouraged to adjust caloric intake to maintain  or achieve ideal body weight, to reduce intake of dietary saturated fat and total fat, to limit sodium intake by avoiding high sodium foods and not adding table salt, and to maintain adequate dietary potassium and calcium preferably from fresh fruits, vegetables, and low-fat dairy products.    stressed the importance of regular exercise  Injury prevention: Discussed safety belts, safety helmets, smoke detector, smoking near bedding or upholstery.   Dental health: Discussed importance of regular tooth brushing, flossing, and dental visits.    NEXT PREVENTATIVE PHYSICAL  DUE IN 1 YEAR. Return in about 1 year (around 04/17/2022) for physical.

## 2021-04-17 NOTE — Patient Instructions (Signed)
Please call to schedule your mammogram and/or bone density: °[facility]  °Address: [address], , [place] [address]  °Phone: [phone] ° °

## 2021-04-17 NOTE — Assessment & Plan Note (Signed)
Labs drawn today. Await results. Treat as needed.  

## 2021-04-18 LAB — CBC WITH DIFFERENTIAL/PLATELET
Basophils Absolute: 0.1 10*3/uL (ref 0.0–0.2)
Basos: 1 %
EOS (ABSOLUTE): 0.1 10*3/uL (ref 0.0–0.4)
Eos: 2 %
Hematocrit: 45.2 % (ref 34.0–46.6)
Hemoglobin: 14.3 g/dL (ref 11.1–15.9)
Immature Grans (Abs): 0 10*3/uL (ref 0.0–0.1)
Immature Granulocytes: 0 %
Lymphocytes Absolute: 2.8 10*3/uL (ref 0.7–3.1)
Lymphs: 38 %
MCH: 26 pg — ABNORMAL LOW (ref 26.6–33.0)
MCHC: 31.6 g/dL (ref 31.5–35.7)
MCV: 82 fL (ref 79–97)
Monocytes Absolute: 0.6 10*3/uL (ref 0.1–0.9)
Monocytes: 8 %
Neutrophils Absolute: 3.8 10*3/uL (ref 1.4–7.0)
Neutrophils: 51 %
Platelets: 361 10*3/uL (ref 150–450)
RBC: 5.51 x10E6/uL — ABNORMAL HIGH (ref 3.77–5.28)
RDW: 14 % (ref 11.7–15.4)
WBC: 7.4 10*3/uL (ref 3.4–10.8)

## 2021-04-18 LAB — COMPREHENSIVE METABOLIC PANEL
ALT: 18 IU/L (ref 0–32)
AST: 19 IU/L (ref 0–40)
Albumin/Globulin Ratio: 1.5 (ref 1.2–2.2)
Albumin: 4.3 g/dL (ref 3.8–4.8)
Alkaline Phosphatase: 92 IU/L (ref 44–121)
BUN/Creatinine Ratio: 11 (ref 9–23)
BUN: 11 mg/dL (ref 6–24)
Bilirubin Total: 0.4 mg/dL (ref 0.0–1.2)
CO2: 24 mmol/L (ref 20–29)
Calcium: 9.6 mg/dL (ref 8.7–10.2)
Chloride: 102 mmol/L (ref 96–106)
Creatinine, Ser: 1 mg/dL (ref 0.57–1.00)
Globulin, Total: 2.9 g/dL (ref 1.5–4.5)
Glucose: 93 mg/dL (ref 70–99)
Potassium: 4.4 mmol/L (ref 3.5–5.2)
Sodium: 138 mmol/L (ref 134–144)
Total Protein: 7.2 g/dL (ref 6.0–8.5)
eGFR: 69 mL/min/{1.73_m2} (ref >59)

## 2021-04-18 LAB — LIPID PANEL W/O CHOL/HDL RATIO
Cholesterol, Total: 248 mg/dL — ABNORMAL HIGH (ref 100–199)
HDL: 36 mg/dL — ABNORMAL LOW (ref >39)
LDL Chol Calc (NIH): 155 mg/dL — ABNORMAL HIGH (ref 0–99)
Triglycerides: 307 mg/dL — ABNORMAL HIGH (ref 0–149)
VLDL Cholesterol Cal: 57 mg/dL — ABNORMAL HIGH (ref 5–40)

## 2021-04-18 LAB — TSH: TSH: 4.58 u[IU]/mL — ABNORMAL HIGH (ref 0.450–4.500)

## 2021-05-02 ENCOUNTER — Other Ambulatory Visit: Payer: Self-pay | Admitting: Family Medicine

## 2021-05-02 DIAGNOSIS — R921 Mammographic calcification found on diagnostic imaging of breast: Secondary | ICD-10-CM

## 2021-05-22 ENCOUNTER — Ambulatory Visit
Admission: RE | Admit: 2021-05-22 | Discharge: 2021-05-22 | Disposition: A | Discharge status: Home / Self Care | Payer: Commercial Managed Care - PPO | Source: Ambulatory Visit | Attending: Family Medicine | Admitting: Family Medicine

## 2021-05-22 ENCOUNTER — Other Ambulatory Visit: Payer: Self-pay

## 2021-05-22 DIAGNOSIS — R921 Mammographic calcification found on diagnostic imaging of breast: Secondary | ICD-10-CM | POA: Diagnosis present

## 2021-09-07 LAB — COLOGUARD: COLOGUARD: NEGATIVE

## 2021-12-10 ENCOUNTER — Encounter: Payer: Self-pay | Admitting: Ophthalmology

## 2021-12-15 NOTE — Discharge Instructions (Signed)

## 2021-12-17 ENCOUNTER — Encounter: Payer: Self-pay | Admitting: Ophthalmology

## 2021-12-17 ENCOUNTER — Ambulatory Visit
Admission: RE | Admit: 2021-12-17 | Discharge: 2021-12-17 | Disposition: A | Discharge status: Home / Self Care | Payer: Commercial Managed Care - PPO | Attending: Ophthalmology | Admitting: Ophthalmology

## 2021-12-17 ENCOUNTER — Other Ambulatory Visit: Payer: Self-pay

## 2021-12-17 ENCOUNTER — Ambulatory Visit: Payer: Commercial Managed Care - PPO | Admitting: Anesthesiology

## 2021-12-17 ENCOUNTER — Encounter
Admission: RE | Disposition: A | Discharge status: Home / Self Care | Payer: Self-pay | Source: Home / Self Care | Attending: Ophthalmology

## 2021-12-17 DIAGNOSIS — H2512 Age-related nuclear cataract, left eye: Secondary | ICD-10-CM | POA: Insufficient documentation

## 2021-12-17 HISTORY — PX: CATARACT EXTRACTION W/PHACO: SHX586

## 2021-12-17 LAB — POCT PREGNANCY, URINE: Preg Test, Ur: NEGATIVE

## 2021-12-17 SURGERY — PHACOEMULSIFICATION, CATARACT, WITH IOL INSERTION
Anesthesia: Monitor Anesthesia Care | Site: Eye | Laterality: Left

## 2021-12-17 MED ORDER — CEFUROXIME OPHTHALMIC INJECTION 1 MG/0.1 ML
INJECTION | OPHTHALMIC | Status: DC | PRN
  Administered 2021-12-17: 0.1 mL via INTRACAMERAL

## 2021-12-17 MED ORDER — SIGHTPATH DOSE#1 BSS IO SOLN
INTRAOCULAR | Status: DC | PRN
  Administered 2021-12-17: 53 mL via OPHTHALMIC

## 2021-12-17 MED ORDER — ARMC OPHTHALMIC DILATING DROPS
1.0000 | OPHTHALMIC | Status: DC | PRN
  Administered 2021-12-17 (×3): 1 via OPHTHALMIC

## 2021-12-17 MED ORDER — BRIMONIDINE TARTRATE-TIMOLOL 0.2-0.5 % OP SOLN
OPHTHALMIC | Status: DC | PRN
  Administered 2021-12-17: 1 [drp] via OPHTHALMIC

## 2021-12-17 MED ORDER — SIGHTPATH DOSE#1 NA HYALUR & NA CHOND-NA HYALUR IO KIT
PACK | INTRAOCULAR | Status: DC | PRN
  Administered 2021-12-17: 1 via OPHTHALMIC

## 2021-12-17 MED ORDER — TETRACAINE HCL 0.5 % OP SOLN
1.0000 [drp] | OPHTHALMIC | Status: DC | PRN
  Administered 2021-12-17 (×3): 1 [drp] via OPHTHALMIC

## 2021-12-17 MED ORDER — MIDAZOLAM HCL 2 MG/2ML IJ SOLN
INTRAMUSCULAR | Status: DC | PRN
  Administered 2021-12-17: 2 mg via INTRAVENOUS

## 2021-12-17 MED ORDER — SIGHTPATH DOSE#1 BSS IO SOLN
INTRAOCULAR | Status: DC | PRN
  Administered 2021-12-17: 15 mL

## 2021-12-17 MED ORDER — SIGHTPATH DOSE#1 BSS IO SOLN
INTRAOCULAR | Status: DC | PRN
  Administered 2021-12-17: 1 mL via INTRAMUSCULAR

## 2021-12-17 SURGICAL SUPPLY — 10 items
CATARACT SUITE SIGHTPATH (MISCELLANEOUS) ×1 IMPLANT
FEE CATARACT SUITE SIGHTPATH (MISCELLANEOUS) ×1 IMPLANT
GLOVE SRG 8 PF TXTR STRL LF DI (GLOVE) ×1 IMPLANT
GLOVE SURG ENC TEXT LTX SZ7.5 (GLOVE) ×1 IMPLANT
GLOVE SURG UNDER POLY LF SZ8 (GLOVE) ×1
LENS IOL TECNIS EYHANCE 20.0 (Intraocular Lens) IMPLANT
NDL FILTER BLUNT 18X1 1/2 (NEEDLE) ×1 IMPLANT
NEEDLE FILTER BLUNT 18X1 1/2 (NEEDLE) ×1 IMPLANT
SYR 3ML LL SCALE MARK (SYRINGE) ×1 IMPLANT
WATER STERILE IRR 250ML POUR (IV SOLUTION) ×1 IMPLANT

## 2021-12-17 NOTE — H&P (Signed)
[facility]   Primary Care Physician:  Valerie Roys, DO Ophthalmologist: Dr. Leandrew Koyanagi  Pre-Procedure History & Physical: HPI:  Candice Robinson is a 51 y.o. female here for ophthalmic surgery.   Past Medical History:  Diagnosis Date   BRCA negative 01/2020   MyRisk neg   Complication of anesthesia    ponv    Family history of breast cancer 01/2020   IBIS=14.4%/riskscore=17.8%   Kidney stone    PONV (postoperative nausea and vomiting)    Requires phenergan for both procedures    Past Surgical History:  Procedure Laterality Date   CYSTOSCOPY WITH STENT PLACEMENT Right 12/03/2017   Procedure: CYSTOSCOPY WITH STENT PLACEMENT;  Surgeon: Billey Co, MD;  Location: [facility];  Service: Urology;  Laterality: Right;   CYSTOSCOPY/URETEROSCOPY/HOLMIUM LASER/STENT PLACEMENT Right 12/27/2017   Procedure: CYSTOSCOPY/URETEROSCOPY/HOLMIUM LASER/STENT Exchange;  Surgeon: Billey Co, MD;  Location: [facility];  Service: Urology;  Laterality: Right;  Large renal stone   CYSTOSCOPY/URETEROSCOPY/HOLMIUM LASER/STENT PLACEMENT Right 01/07/2018   Procedure: CYSTOSCOPY/URETEROSCOPY/HOLMIUM LASER/STENT Exchange;  Surgeon: Billey Co, MD;  Location: [facility];  Service: Urology;  Laterality: Right;  Large renal stone   HOLMIUM LASER APPLICATION Right 01/0/2725   Procedure: HOLMIUM LASER APPLICATION;  Surgeon: Billey Co, MD;  Location: [facility];  Service: Urology;  Laterality: Right;   IR NEPHROSTOMY PLACEMENT RIGHT  12/03/2017   NEPHROLITHOTOMY Right 12/03/2017   Procedure: NEPHROLITHOTOMY PERCUTANEOUS;  Surgeon: Billey Co, MD;  Location: [facility];  Service: Urology;  Laterality: Right;   TUBAL LIGATION  1996   tummy tuck  2004    Prior to Admission medications   Medication Sig Start Date End Date Taking? Authorizing Provider  ibuprofen (ADVIL) 200 MG tablet Take 600 mg by mouth as needed.   Yes [provider]  Magnesium 250 MG TABS Take by  mouth daily.   Yes [provider]  Multiple Vitamins-Minerals (MULTIVITAMIN WOMEN PO) Take 2 tablets by mouth daily. Nature's Bounty   Yes [provider]  psyllium (REGULOID) 0.52 g capsule Take 2 capsules by mouth 2 (two) times daily.   Yes [provider]  diphenhydrAMINE (BENADRYL) 25 MG tablet Take 25 mg by mouth every 6 (six) hours as needed.    [provider]    Allergies as of 10/31/2021 - Review Complete 04/17/2021  Allergen Reaction Noted   Bactrim [sulfamethoxazole-trimethoprim] Shortness Of Breath, Nausea Only, and Other (See Comments) 12/20/2017   Codeine Other (See Comments) 05/16/2015   Dilaudid [hydromorphone hcl] Nausea Only and Other (See Comments) 12/20/2017   Gluten meal Other (See Comments) 11/19/2017    Family History  Problem Relation Age of Onset   Diabetes Mother    Hyperlipidemia Mother    Hypertension Mother    Hyperlipidemia Father    Hypertension Father    Heart disease Father    Dementia Father    Frontotemporal dementia Father    Lupus Sister    Diabetes Sister    Hypertension Sister    Breast cancer Maternal Aunt        65-60   Breast cancer Maternal Aunt        45-60   Breast cancer Maternal Aunt        45-60   Bladder Cancer Neg Hx    Kidney cancer Neg Hx     Social History   Socioeconomic History   Marital status: Married    Spouse name: richard   Number of children: Not on  file   Years of education: Not on file   Highest education level: Not on file  Occupational History   Occupation: supervisor  Tobacco Use   Smoking status: Never   Smokeless tobacco: Never  Vaping Use   Vaping Use: Never used  Substance and Sexual Activity   Alcohol use: Yes    Comment: socially. maybe 1 drink every 2 or 3 months   Drug use: No   Sexual activity: Yes    Birth control/protection: Surgical  Other Topics Concern   Not on file  Social History Narrative   Not on file   Social Determinants of Health    Financial Resource Strain: Not on file  Food Insecurity: Not on file  Transportation Needs: Not on file  Physical Activity: Not on file  Stress: Not on file  Social Connections: Not on file  Intimate Partner Violence: Not on file    Review of Systems: See HPI, otherwise negative ROS  Physical Exam: BP (!) 142/89   Pulse 67   Temp (!) 97.5 F (36.4 C) (Temporal)   Ht _0  (1.575 m)   Wt 75.8 kg   LMP 07/31/2021 (Approximate) Comment: preg test neg  SpO2 100%   BMI 30.54 kg/m  General:   Alert,  pleasant and cooperative in NAD Head:  Normocephalic and atraumatic. Lungs:  Clear to auscultation.    Heart:  Regular rate and rhythm.   Impression/Plan: Candice Robinson is here for ophthalmic surgery.  Risks, benefits, limitations, and alternatives regarding ophthalmic surgery have been reviewed with the patient.  Questions have been answered.  All parties agreeable.   Leandrew Koyanagi, MD  12/17/2021, 9:24 AM

## 2021-12-17 NOTE — Anesthesia Postprocedure Evaluation (Signed)
Anesthesia Post Note  Patient: Candice Robinson  Procedure(s) Performed: CATARACT EXTRACTION PHACO AND INTRAOCULAR LENS PLACEMENT (IOC) LEFT 8.30 00:56.6 (Left: Eye)  Patient location during evaluation: PACU Anesthesia Type: MAC Level of consciousness: awake and alert Pain management: pain level controlled Vital Signs Assessment: post-procedure vital signs reviewed and stable Respiratory status: spontaneous breathing, nonlabored ventilation, respiratory function stable and patient connected to nasal cannula oxygen Cardiovascular status: stable and blood pressure returned to baseline Postop Assessment: no apparent nausea or vomiting Anesthetic complications: no   No notable events documented.   Last Vitals:  Vitals:   12/17/21 1019 12/17/21 1025  BP: 129/72 137/79  Pulse: 65 68  Resp: 14 14  Temp: (!) 36.3 C   SpO2: 98% 98%    Last Pain:  Vitals:   12/17/21 1025  TempSrc:   PainSc: 0-No pain                 Ilene Qua

## 2021-12-17 NOTE — Op Note (Signed)
  OPERATIVE NOTE  Candice Robinson [id number] 12/17/2021   PREOPERATIVE DIAGNOSIS:  Nuclear sclerotic cataract left eye. H25.12   POSTOPERATIVE DIAGNOSIS:    Nuclear sclerotic cataract left eye.     PROCEDURE:  Phacoemusification with posterior chamber intraocular lens placement of the left eye  Ultrasound time: Procedure(s): CATARACT EXTRACTION PHACO AND INTRAOCULAR LENS PLACEMENT (IOC) LEFT 8.30 00:56.6 (Left)  LENS:   Implant Name Type Inv. Item Serial No. Manufacturer Lot No. LRB No. Used Action  LENS IOL TECNIS EYHANCE 20.0 - [id number] Intraocular Lens LENS IOL TECNIS EYHANCE 20.0 [id number] [employer]  Left 1 Implanted      SURGEON:  Wyonia Hough, MD   ANESTHESIA:  Topical with tetracaine drops and 2% Xylocaine jelly, augmented with 1% preservative-free intracameral lidocaine.    COMPLICATIONS:  None.   DESCRIPTION OF PROCEDURE:  The patient was identified in the holding room and transported to the operating room and placed in the supine position under the operating microscope.  The left eye was identified as the operative eye and it was prepped and draped in the usual sterile ophthalmic fashion.   A 1 millimeter clear-corneal paracentesis was made at the 1:30 position.  0.5 ml of preservative-free 1% lidocaine was injected into the anterior chamber.  The anterior chamber was filled with Viscoat viscoelastic.  A 2.4 millimeter keratome was used to make a near-clear corneal incision at the 10:30 position.  .  A curvilinear capsulorrhexis was made with a cystotome and capsulorrhexis forceps.  Balanced salt solution was used to hydrodissect and hydrodelineate the nucleus.   Phacoemulsification was then used in stop and chop fashion to remove the lens nucleus and epinucleus.  The remaining cortex was then removed using the irrigation and aspiration handpiece. Provisc was then placed into the capsular bag to distend it for lens placement.  A lens was then injected into the  capsular bag.  The remaining viscoelastic was aspirated.   Wounds were hydrated with balanced salt solution.  The anterior chamber was inflated to a physiologic pressure with balanced salt solution.  No wound leaks were noted. Cefuroxime 0.1 ml of a 10mg /ml solution was injected into the anterior chamber for a dose of 1 mg of intracameral antibiotic at the completion of the case.   Timolol and Brimonidine drops were applied to the eye.  The patient was taken to the recovery room in stable condition without complications of anesthesia or surgery.  [name] 12/17/2021, 10:17 AM

## 2021-12-17 NOTE — Transfer of Care (Signed)
Immediate Anesthesia Transfer of Care Note  Patient: Candice Robinson  Procedure(s) Performed: CATARACT EXTRACTION PHACO AND INTRAOCULAR LENS PLACEMENT (IOC) LEFT 8.30 00:56.6 (Left: Eye)  Patient Location: PACU  Anesthesia Type:MAC  Level of Consciousness: awake, alert  and oriented  Airway & Oxygen Therapy: Patient Spontanous Breathing  Post-op Assessment: Report given to RN and Post -op Vital signs reviewed and stable  Post vital signs: stable  Last Vitals:  Vitals Value Taken Time  BP    Temp    Pulse 65 12/17/21 1019  Resp 14 12/17/21 1019  SpO2 98 % 12/17/21 1019  Vitals shown include unvalidated device data.  Last Pain:  Vitals:   12/17/21 0906  TempSrc: Temporal  PainSc: 0-No pain         Complications: No notable events documented.

## 2021-12-17 NOTE — Anesthesia Preprocedure Evaluation (Signed)
Anesthesia Evaluation  Patient identified by MRN, date of birth, ID band Patient awake    Reviewed: Allergy & Precautions, NPO status , Patient's Chart, lab work & pertinent test results  History of Anesthesia Complications (+) PONV and history of anesthetic complications  Airway Mallampati: III  TM Distance: >3 FB Neck ROM: full    Dental  (+) Teeth Intact   Pulmonary neg pulmonary ROS,    Pulmonary exam normal        Cardiovascular negative cardio ROS Normal cardiovascular exam     Neuro/Psych negative neurological ROS  negative psych ROS   GI/Hepatic negative GI ROS, Neg liver ROS,   Endo/Other  negative endocrine ROS  Renal/GU      Musculoskeletal   Abdominal   Peds  Hematology negative hematology ROS (+)   Anesthesia Other Findings Past Medical History: 01/2020: BRCA negative     Comment:  MyRisk neg No date: Complication of anesthesia     Comment:  ponv  01/2020: Family history of breast cancer     Comment:  IBIS=14.4%/riskscore=17.8% No date: Kidney stone No date: PONV (postoperative nausea and vomiting)     Comment:  Requires phenergan for both procedures  Past Surgical History: 12/03/2017: CYSTOSCOPY WITH STENT PLACEMENT; Right     Comment:  Procedure: CYSTOSCOPY WITH STENT PLACEMENT;  Surgeon:               Billey Co, MD;  Location: [facility];  Service:               Urology;  Laterality: Right; 12/27/2017: CYSTOSCOPY/URETEROSCOPY/HOLMIUM LASER/STENT PLACEMENT;  Right     Comment:  Procedure: CYSTOSCOPY/URETEROSCOPY/HOLMIUM LASER/STENT               Exchange;  Surgeon: Billey Co, MD;  Location: [facility]              [facility];  Service: Urology;  Laterality: Right;  Large renal               stone 01/07/2018: CYSTOSCOPY/URETEROSCOPY/HOLMIUM LASER/STENT PLACEMENT;  Right     Comment:  Procedure: CYSTOSCOPY/URETEROSCOPY/HOLMIUM LASER/STENT               Exchange;  Surgeon: Billey Co,  MD;  Location: [facility]              [facility];  Service: Urology;  Laterality: Right;  Large renal               stone 12/03/2017: HOLMIUM LASER APPLICATION; Right     Comment:  Procedure: HOLMIUM LASER APPLICATION;  Surgeon: Billey Co, MD;  Location: [facility];  Service: Urology;                Laterality: Right; 12/03/2017: IR NEPHROSTOMY PLACEMENT RIGHT 12/03/2017: NEPHROLITHOTOMY; Right     Comment:  Procedure: NEPHROLITHOTOMY PERCUTANEOUS;  Surgeon:               Billey Co, MD;  Location: [facility];  Service:               Urology;  Laterality: Right; 1996: TUBAL LIGATION 2004: tummy tuck  BMI    Body Mass Index: 30.54 kg/m      Reproductive/Obstetrics negative OB ROS                             Anesthesia Physical Anesthesia  Plan  ASA: 2  Anesthesia Plan: MAC   Post-op Pain Management:    Induction: Intravenous  PONV Risk Score and Plan: Midazolam and Treatment may vary due to age or medical condition  Airway Management Planned: Natural Airway and Nasal Cannula  Additional Equipment:   Intra-op Plan:   Post-operative Plan:   Informed Consent: I have reviewed the patients History and Physical, chart, labs and discussed the procedure including the risks, benefits and alternatives for the proposed anesthesia with the patient or authorized representative who has indicated his/her understanding and acceptance.     Dental Advisory Given  Plan Discussed with: Anesthesiologist, CRNA and Surgeon  Anesthesia Plan Comments: (Patient consented for risks of anesthesia including but not limited to:  - adverse reactions to medications - damage to eyes, teeth, lips or other oral mucosa - nerve damage due to positioning  - sore throat or hoarseness - Damage to heart, brain, nerves, lungs, other parts of body or loss of life  Patient voiced understanding.)        Anesthesia Quick Evaluation

## 2021-12-18 ENCOUNTER — Encounter: Payer: Self-pay | Admitting: Ophthalmology

## 2022-01-12 ENCOUNTER — Ambulatory Visit: Payer: Commercial Managed Care - PPO | Admitting: Podiatry

## 2022-01-12 ENCOUNTER — Telehealth: Payer: Self-pay

## 2022-01-12 DIAGNOSIS — L603 Nail dystrophy: Secondary | ICD-10-CM

## 2022-01-12 NOTE — Telephone Encounter (Signed)
Called [facility] to pick up nail specimen for fungal culture -[place] office Confirmation code: [phone]

## 2022-01-14 NOTE — Progress Notes (Signed)
  Subjective:  Patient ID: Candice Robinson, female    DOB: [birth year],  MRN: [id number]  Chief Complaint  Patient presents with   Nail Problem    Toenail is lifting, coming off    51 y.o. female presents with the above complaint. History confirmed with patient.   Objective:  Physical Exam: warm, good capillary refill, no trophic changes or ulcerative lesions, normal DP and PT pulses, normal sensory exam, and superficial white discoloration of bilateral hallux nail with onycholysis of right       Assessment:   1. Nail dystrophy      Plan:  Patient was evaluated and treated and all questions answered.  We discussed etiology and treatment options of nail dystrophy including possible onychomycosis.  Nail culture of the right hallux nail plate was taken.  We discussed topical and oral treatment.  As we have her culture results we will discuss the best route for her to take care of this.  I will let her know what her results show via MyChart message  Return if symptoms worsen or fail to improve, for follow up after nail fungus treatment.

## 2022-01-26 ENCOUNTER — Encounter: Payer: Self-pay | Admitting: Podiatry

## 2022-01-27 ENCOUNTER — Telehealth: Payer: Self-pay | Admitting: Podiatry

## 2022-01-27 MED ORDER — TERBINAFINE HCL 250 MG PO TABS: 250.0000 mg | ORAL_TABLET | Freq: Every day | ORAL | 0 refills | Status: DC

## 2022-01-27 NOTE — Telephone Encounter (Signed)
Please send medication Terbinafine to [id number] - [place], [place] - [address]   Please advise.

## 2022-02-03 LAB — FUNGUS CULTURE W SMEAR

## 2022-03-24 ENCOUNTER — Encounter: Payer: Self-pay | Admitting: Family Medicine

## 2022-04-20 ENCOUNTER — Encounter: Payer: Commercial Managed Care - PPO | Admitting: Family Medicine

## 2022-04-26 ENCOUNTER — Other Ambulatory Visit: Payer: Self-pay | Admitting: Podiatry

## 2022-04-28 ENCOUNTER — Encounter: Payer: Commercial Managed Care - PPO | Admitting: Family Medicine

## 2022-05-03 ENCOUNTER — Other Ambulatory Visit: Payer: Self-pay | Admitting: Podiatry

## 2022-05-07 ENCOUNTER — Telehealth: Payer: Self-pay

## 2022-05-07 ENCOUNTER — Ambulatory Visit (INDEPENDENT_AMBULATORY_CARE_PROVIDER_SITE_OTHER): Payer: Commercial Managed Care - PPO | Admitting: Family Medicine

## 2022-05-07 ENCOUNTER — Encounter: Payer: Self-pay | Admitting: Family Medicine

## 2022-05-07 VITALS — BP 137/83 | HR 67 | Temp 97.9°F | Ht 64.25 in | Wt 172.1 lb

## 2022-05-07 DIAGNOSIS — Z0000 Encounter for general adult medical examination without abnormal findings: Secondary | ICD-10-CM

## 2022-05-07 DIAGNOSIS — Z1231 Encounter for screening mammogram for malignant neoplasm of breast: Secondary | ICD-10-CM

## 2022-05-07 LAB — URINALYSIS, ROUTINE W REFLEX MICROSCOPIC
Bilirubin, UA: NEGATIVE
Glucose, UA: NEGATIVE
Ketones, UA: NEGATIVE
Leukocytes,UA: NEGATIVE
Nitrite, UA: NEGATIVE
Protein,UA: NEGATIVE
RBC, UA: NEGATIVE
Specific Gravity, UA: 1.01 (ref 1.005–1.030)
Urobilinogen, Ur: 0.2 mg/dL (ref 0.2–1.0)
pH, UA: 6.5 (ref 5.0–7.5)

## 2022-05-07 NOTE — Telephone Encounter (Signed)
-----   Message from Candice Roys, DO sent at 05/07/2022  8:48 AM EST ----- Can we get her scheduled for mammo- first thing in the AM after 3/23

## 2022-05-07 NOTE — Telephone Encounter (Signed)
Patient was scheduled for Mammogram at 05/27/22 at 9:00 AM. Patient was notified and verbalized understanding.

## 2022-05-07 NOTE — Progress Notes (Signed)
BP 137/83   Pulse 67   Temp 97.9 F (36.6 C) (Oral)   Ht 5' 4.25" (1.632 m)   Wt 172 lb 1.6 oz (78.1 kg)   SpO2 98%   BMI 29.31 kg/m    Subjective:    Patient ID: Candice Robinson, female    DOB: 1970/09/20, 52 y.o.   MRN: [id number]  HPI: Candice Robinson is a 52 y.o. female presenting on 05/07/2022 for comprehensive medical examination. Current medical complaints include:none  She currently lives with: husband and kids Menopausal Symptoms: no  Depression Screen done today and results listed below:     05/07/2022    8:37 AM 04/17/2021   11:44 AM 09/27/2020    8:29 AM 04/15/2020    3:46 PM 03/21/2020   10:01 AM  Depression screen PHQ 2/9  Decreased Interest 0 0 0 0 0  Down, Depressed, Hopeless 0 0 0 0 0  PHQ - 2 Score 0 0 0 0 0  Altered sleeping 0 0  0   Tired, decreased energy 0 0  0   Change in appetite 0 0  0   Feeling bad or failure about yourself  0 0  0   Trouble concentrating 0 0  0   Moving slowly or fidgety/restless 0 0  0   Suicidal thoughts 0 0  0   PHQ-9 Score 0 0  0   Difficult doing work/chores Not difficult at all        Past Medical History:  Past Medical History:  Diagnosis Date   BRCA negative 01/2020   MyRisk neg   Complication of anesthesia    ponv    Family history of breast cancer 01/2020   IBIS=14.4%/riskscore=17.8%   Kidney stone    PONV (postoperative nausea and vomiting)    Requires phenergan for both procedures    Surgical History:  Past Surgical History:  Procedure Laterality Date   CATARACT EXTRACTION W/PHACO Left 12/17/2021   Procedure: CATARACT EXTRACTION PHACO AND INTRAOCULAR LENS PLACEMENT ([place]) LEFT 8.30 00:56.6;  Surgeon: Leandrew Koyanagi, MD;  Location: [facility];  Service: Ophthalmology;  Laterality: Left;   CYSTOSCOPY WITH STENT PLACEMENT Right 12/03/2017   Procedure: CYSTOSCOPY WITH STENT PLACEMENT;  Surgeon: Billey Co, MD;  Location: [facility];  Service: Urology;  Laterality: Right;    CYSTOSCOPY/URETEROSCOPY/HOLMIUM LASER/STENT PLACEMENT Right 12/27/2017   Procedure: CYSTOSCOPY/URETEROSCOPY/HOLMIUM LASER/STENT Exchange;  Surgeon: Billey Co, MD;  Location: [facility];  Service: Urology;  Laterality: Right;  Large renal stone   CYSTOSCOPY/URETEROSCOPY/HOLMIUM LASER/STENT PLACEMENT Right 01/07/2018   Procedure: CYSTOSCOPY/URETEROSCOPY/HOLMIUM LASER/STENT Exchange;  Surgeon: Billey Co, MD;  Location: [facility];  Service: Urology;  Laterality: Right;  Large renal stone   HOLMIUM LASER APPLICATION Right [id number]   Procedure: HOLMIUM LASER APPLICATION;  Surgeon: Billey Co, MD;  Location: [facility];  Service: Urology;  Laterality: Right;   IR NEPHROSTOMY PLACEMENT RIGHT  12/03/2017   NEPHROLITHOTOMY Right 12/03/2017   Procedure: NEPHROLITHOTOMY PERCUTANEOUS;  Surgeon: Billey Co, MD;  Location: [facility];  Service: Urology;  Laterality: Right;   TUBAL LIGATION  1996   tummy tuck  2004    Medications:  Current Outpatient Medications on File Prior to Visit  Medication Sig   diphenhydrAMINE (BENADRYL) 25 MG tablet Take 25 mg by mouth every 6 (six) hours as needed.   ibuprofen (ADVIL) 200 MG tablet Take 600 mg by mouth as needed.   Magnesium 250 MG TABS Take by mouth daily.  Multiple Vitamins-Minerals (MULTIVITAMIN WOMEN PO) Take 2 tablets by mouth daily. Nature's Bounty   psyllium (REGULOID) 0.52 g capsule Take 2 capsules by mouth 2 (two) times daily.   terbinafine (LAMISIL) 250 MG tablet TAKE 1 TABLET BY MOUTH EVERY DAY   No current facility-administered medications on file prior to visit.    Allergies:  Allergies  Allergen Reactions   Bactrim [Sulfamethoxazole-Trimethoprim] Shortness Of Breath, Nausea Only and Other (See Comments)    Fatigue   Codeine Other (See Comments)    Hallucinations   Dilaudid [Hydromorphone Hcl] Nausea Only and Other (See Comments)    Extreme sweating   Gluten Meal Other (See Comments)    Stomach bloating, indigestion      Social History:  Social History   Socioeconomic History   Marital status: Divorced    Spouse name: richard   Number of children: Not on file   Years of education: Not on file   Highest education level: Not on file  Occupational History   Occupation: Librarian, academic  Tobacco Use   Smoking status: Never   Smokeless tobacco: Never  Vaping Use   Vaping Use: Never used  Substance and Sexual Activity   Alcohol use: Yes    Comment: socially. maybe 1 drink every 2 or 3 months   Drug use: No   Sexual activity: Yes    Birth control/protection: Surgical  Other Topics Concern   Not on file  Social History Narrative   Not on file   Social Determinants of Health   Financial Resource Strain: Not on file  Food Insecurity: Not on file  Transportation Needs: Not on file  Physical Activity: Not on file  Stress: Not on file  Social Connections: Not on file  Intimate Partner Violence: Not on file   Social History   Tobacco Use  Smoking Status Never  Smokeless Tobacco Never   Social History   Substance and Sexual Activity  Alcohol Use Yes   Comment: socially. maybe 1 drink every 2 or 3 months    Family History:  Family History  Problem Relation Age of Onset   Diabetes Mother    Hyperlipidemia Mother    Hypertension Mother    Hyperlipidemia Father    Hypertension Father    Heart disease Father    Dementia Father    Frontotemporal dementia Father    Lupus Sister    Diabetes Sister    Hypertension Sister    Breast cancer Maternal Aunt        79-60   Breast cancer Maternal Aunt        15-60   Breast cancer Maternal Aunt        45-60   Bladder Cancer Neg Hx    Kidney cancer Neg Hx     Past medical history, surgical history, medications, allergies, family history and social history reviewed with patient today and changes made to appropriate areas of the chart.   Review of Systems  Constitutional: Negative.   HENT: Negative.    Eyes: Negative.   Respiratory:  Negative.    Cardiovascular: Negative.   Gastrointestinal: Negative.   Genitourinary: Negative.   Musculoskeletal: Negative.   Skin: Negative.   Neurological: Negative.   Endo/Heme/Allergies:  Positive for environmental allergies. Negative for polydipsia. Does not bruise/bleed easily.  Psychiatric/Behavioral: Negative.     All other ROS negative except what is listed above and in the HPI.      Objective:    BP 137/83   Pulse 67  Temp 97.9 F (36.6 C) (Oral)   Ht 5' 4.25" (1.632 m)   Wt 172 lb 1.6 oz (78.1 kg)   SpO2 98%   BMI 29.31 kg/m   Wt Readings from Last 3 Encounters:  05/07/22 172 lb 1.6 oz (78.1 kg)  12/17/21 167 lb (75.8 kg)  04/17/21 170 lb (77.1 kg)    Physical Exam Vitals and nursing note reviewed.  Constitutional:      General: She is not in acute distress.    Appearance: Normal appearance. She is normal weight. She is not ill-appearing, toxic-appearing or diaphoretic.  HENT:     Head: Normocephalic and atraumatic.     Right Ear: Tympanic membrane, ear canal and external ear normal. There is no impacted cerumen.     Left Ear: Tympanic membrane, ear canal and external ear normal. There is no impacted cerumen.     Nose: Nose normal. No congestion or rhinorrhea.     Mouth/Throat:     Mouth: Mucous membranes are moist.     Pharynx: Oropharynx is clear. No oropharyngeal exudate or posterior oropharyngeal erythema.  Eyes:     General: No scleral icterus.       Right eye: No discharge.        Left eye: No discharge.     Extraocular Movements: Extraocular movements intact.     Conjunctiva/sclera: Conjunctivae normal.     Pupils: Pupils are equal, round, and reactive to light.  Neck:     Vascular: No carotid bruit.  Cardiovascular:     Rate and Rhythm: Normal rate and regular rhythm.     Pulses: Normal pulses.     Heart sounds: No murmur heard.    No friction rub. No gallop.  Pulmonary:     Effort: Pulmonary effort is normal. No respiratory distress.      Breath sounds: Normal breath sounds. No stridor. No wheezing, rhonchi or rales.  Chest:     Chest wall: No tenderness.  Abdominal:     General: Abdomen is flat. Bowel sounds are normal. There is no distension.     Palpations: Abdomen is soft. There is no mass.     Tenderness: There is no abdominal tenderness. There is no right CVA tenderness, left CVA tenderness, guarding or rebound.     Hernia: No hernia is present.  Genitourinary:    Comments: Breast and pelvic exams deferred with shared decision making Musculoskeletal:        General: No swelling, tenderness, deformity or signs of injury.     Cervical back: Normal range of motion and neck supple. No rigidity. No muscular tenderness.     Right lower leg: No edema.     Left lower leg: No edema.  Lymphadenopathy:     Cervical: No cervical adenopathy.  Skin:    General: Skin is warm and dry.     Capillary Refill: Capillary refill takes less than 2 seconds.     Coloration: Skin is not jaundiced or pale.     Findings: No bruising, erythema, lesion or rash.  Neurological:     General: No focal deficit present.     Mental Status: She is alert and oriented to person, place, and time. Mental status is at baseline.     Cranial Nerves: No cranial nerve deficit.     Sensory: No sensory deficit.     Motor: No weakness.     Coordination: Coordination normal.     Gait: Gait normal.     Deep Tendon Reflexes:  Reflexes normal.  Psychiatric:        Mood and Affect: Mood normal.        Behavior: Behavior normal.        Thought Content: Thought content normal.        Judgment: Judgment normal.     Results for orders placed or performed in visit on 01/12/22  Fungus culture w smear   Specimen: PATH Nail   Nails Sample from left  Result Value Ref Range   Fungus Stain Final report (A)    Result 1 Comment (A)    Fungus (Mycology) Culture Final report    Fungal result 1 Comment       Assessment & Plan:   Problem List Items Addressed This  Visit   None Visit Diagnoses     Routine general medical examination at a health care facility    -  Primary   Vaccines up to date. Screening labs checked today. Pap and cologuard up to date. Mammogram ordered today. Continue diet and exercise. Call with any concerns.   Relevant Orders   CBC with Differential/Platelet   Comprehensive metabolic panel   Lipid Panel w/o Chol/HDL Ratio   Urinalysis, Routine w reflex microscopic   TSH   Encounter for screening mammogram for malignant neoplasm of breast       Mammogram ordered today.   Relevant Orders   MM Digital Screening   MM 3D SCREENING MAMMOGRAM BILATERAL BREAST        Follow up plan: Return in about 1 year (around 05/07/2023) for physical.   LABORATORY TESTING:  - Pap smear: up to date  IMMUNIZATIONS:   - Tdap: Tetanus vaccination status reviewed: last tetanus booster within 10 years. - Influenza: Refused - Pneumovax: Not applicable - Prevnar: Not applicable - COVID: Refused - HPV: Not applicable - Shingrix vaccine: out of stock- will come back for ASAP  SCREENING: -Mammogram: Ordered today  - Colonoscopy: Up to date   PATIENT COUNSELING:   Advised to take 1 mg of folate supplement per day if capable of pregnancy.   Sexuality: Discussed sexually transmitted diseases, partner selection, use of condoms, avoidance of unintended pregnancy  and contraceptive alternatives.   Advised to avoid cigarette smoking.  I discussed with the patient that most people either abstain from alcohol or drink within safe limits (<=14/week and <=4 drinks/occasion for males, <=7/weeks and <= 3 drinks/occasion for females) and that the risk for alcohol disorders and other health effects rises proportionally with the number of drinks per week and how often a drinker exceeds daily limits.  Discussed cessation/primary prevention of drug use and availability of treatment for abuse.   Diet: Encouraged to adjust caloric intake to maintain  or  achieve ideal body weight, to reduce intake of dietary saturated fat and total fat, to limit sodium intake by avoiding high sodium foods and not adding table salt, and to maintain adequate dietary potassium and calcium preferably from fresh fruits, vegetables, and low-fat dairy products.    stressed the importance of regular exercise  Injury prevention: Discussed safety belts, safety helmets, smoke detector, smoking near bedding or upholstery.   Dental health: Discussed importance of regular tooth brushing, flossing, and dental visits.    NEXT PREVENTATIVE PHYSICAL DUE IN 1 YEAR. Return in about 1 year (around 05/07/2023) for physical.

## 2022-05-08 LAB — CBC WITH DIFFERENTIAL/PLATELET
Basophils Absolute: 0.1 10*3/uL (ref 0.0–0.2)
Basos: 1 %
EOS (ABSOLUTE): 0.1 10*3/uL (ref 0.0–0.4)
Eos: 1 %
Hematocrit: 48.6 % — ABNORMAL HIGH (ref 34.0–46.6)
Hemoglobin: 15.5 g/dL (ref 11.1–15.9)
Immature Grans (Abs): 0 10*3/uL (ref 0.0–0.1)
Immature Granulocytes: 0 %
Lymphocytes Absolute: 3 10*3/uL (ref 0.7–3.1)
Lymphs: 39 %
MCH: 27.2 pg (ref 26.6–33.0)
MCHC: 31.9 g/dL (ref 31.5–35.7)
MCV: 85 fL (ref 79–97)
Monocytes Absolute: 0.7 10*3/uL (ref 0.1–0.9)
Monocytes: 9 %
Neutrophils Absolute: 3.9 10*3/uL (ref 1.4–7.0)
Neutrophils: 50 %
Platelets: 337 10*3/uL (ref 150–450)
RBC: 5.7 x10E6/uL — ABNORMAL HIGH (ref 3.77–5.28)
RDW: 12.8 % (ref 11.7–15.4)
WBC: 7.7 10*3/uL (ref 3.4–10.8)

## 2022-05-08 LAB — LIPID PANEL W/O CHOL/HDL RATIO
Cholesterol, Total: 260 mg/dL — ABNORMAL HIGH (ref 100–199)
HDL: 46 mg/dL (ref >39)
LDL Chol Calc (NIH): 177 mg/dL — ABNORMAL HIGH (ref 0–99)
Triglycerides: 199 mg/dL — ABNORMAL HIGH (ref 0–149)
VLDL Cholesterol Cal: 37 mg/dL (ref 5–40)

## 2022-05-08 LAB — COMPREHENSIVE METABOLIC PANEL
ALT: 22 IU/L (ref 0–32)
AST: 23 IU/L (ref 0–40)
Albumin/Globulin Ratio: 1.6 (ref 1.2–2.2)
Albumin: 4.5 g/dL (ref 3.8–4.9)
Alkaline Phosphatase: 99 IU/L (ref 44–121)
BUN/Creatinine Ratio: 9 (ref 9–23)
BUN: 9 mg/dL (ref 6–24)
Bilirubin Total: 0.4 mg/dL (ref 0.0–1.2)
CO2: 24 mmol/L (ref 20–29)
Calcium: 10.2 mg/dL (ref 8.7–10.2)
Chloride: 100 mmol/L (ref 96–106)
Creatinine, Ser: 0.98 mg/dL (ref 0.57–1.00)
Globulin, Total: 2.9 g/dL (ref 1.5–4.5)
Glucose: 69 mg/dL — ABNORMAL LOW (ref 70–99)
Potassium: 3.9 mmol/L (ref 3.5–5.2)
Sodium: 142 mmol/L (ref 134–144)
Total Protein: 7.4 g/dL (ref 6.0–8.5)
eGFR: 70 mL/min/{1.73_m2} (ref >59)

## 2022-05-08 LAB — TSH: TSH: 4.41 u[IU]/mL (ref 0.450–4.500)

## 2022-05-27 ENCOUNTER — Ambulatory Visit
Admission: RE | Admit: 2022-05-27 | Discharge: 2022-05-27 | Disposition: A | Discharge status: Home / Self Care | Payer: Commercial Managed Care - PPO | Source: Ambulatory Visit | Attending: Family Medicine | Admitting: Family Medicine

## 2022-05-27 DIAGNOSIS — Z1231 Encounter for screening mammogram for malignant neoplasm of breast: Secondary | ICD-10-CM | POA: Diagnosis not present

## 2022-05-28 ENCOUNTER — Other Ambulatory Visit: Payer: Self-pay | Admitting: Family Medicine

## 2022-05-28 DIAGNOSIS — R928 Other abnormal and inconclusive findings on diagnostic imaging of breast: Secondary | ICD-10-CM

## 2022-05-28 DIAGNOSIS — R921 Mammographic calcification found on diagnostic imaging of breast: Secondary | ICD-10-CM

## 2022-05-28 DIAGNOSIS — N6489 Other specified disorders of breast: Secondary | ICD-10-CM

## 2022-06-02 ENCOUNTER — Ambulatory Visit
Admission: RE | Admit: 2022-06-02 | Discharge: 2022-06-02 | Disposition: A | Discharge status: Home / Self Care | Payer: Commercial Managed Care - PPO | Source: Ambulatory Visit | Attending: Family Medicine | Admitting: Family Medicine

## 2022-06-02 DIAGNOSIS — N6489 Other specified disorders of breast: Secondary | ICD-10-CM | POA: Diagnosis present

## 2022-06-02 DIAGNOSIS — R928 Other abnormal and inconclusive findings on diagnostic imaging of breast: Secondary | ICD-10-CM

## 2022-06-02 DIAGNOSIS — R921 Mammographic calcification found on diagnostic imaging of breast: Secondary | ICD-10-CM | POA: Diagnosis present

## 2023-04-19 ENCOUNTER — Encounter: Payer: Self-pay | Admitting: Podiatry

## 2023-04-19 ENCOUNTER — Ambulatory Visit: Payer: Commercial Managed Care - PPO | Admitting: Podiatry

## 2023-04-19 DIAGNOSIS — B353 Tinea pedis: Secondary | ICD-10-CM

## 2023-04-19 DIAGNOSIS — M21621 Bunionette of right foot: Secondary | ICD-10-CM

## 2023-04-19 MED ORDER — KETOCONAZOLE 2 % EX CREA: 1.0000 | TOPICAL_CREAM | Freq: Two times a day (BID) | CUTANEOUS | 2 refills | Status: AC

## 2023-04-19 NOTE — Progress Notes (Signed)
  Subjective:  Patient ID: Candice Robinson, female    DOB: 12-21-1970,  MRN: [id number]  Chief Complaint  Patient presents with   Toe Pain    "My little toe feels like it's on fire and shoots pain up the sides."    53 y.o. female presents with the above complaint. History confirmed with patient.  Does not hurt when she is walking and only when she pulls her foot a certain way  Objective:  Physical Exam: warm, good capillary refill, no trophic changes or ulcerative lesions, normal DP and PT pulses, normal sensory exam, and her right foot has a tailor's bunion deformity as well as adductovarus contracture of the fifth toe there is interdigital maceration tinea pedis  Assessment:   1. Tinea pedis, right   2. Tailor's bunion of right foot      Plan:  Patient was evaluated and treated and all questions answered.  Tinea pedis could be a factor here.  I prescribed her ketoconazole cream to use twice daily until it clears up.  Discussed antifungal spray use and shoes to clear this out of her shoe gear.  We also discussed the position and impact of the tailor's bunion deformity as well as the adductovarus contracture of the fifth toe that may also be contributing to this.  If not improving or worsening would recommend x-rays and surgical consideration.  She will follow-up me as needed if it returns or worsens.  Return if symptoms worsen or fail to improve.

## 2023-05-10 ENCOUNTER — Encounter: Payer: Self-pay | Admitting: Family Medicine

## 2023-05-10 ENCOUNTER — Ambulatory Visit (INDEPENDENT_AMBULATORY_CARE_PROVIDER_SITE_OTHER): Payer: Commercial Managed Care - PPO | Admitting: Family Medicine

## 2023-05-10 VITALS — BP 138/85 | HR 64 | Temp 97.9°F | Resp 16 | Ht 64.17 in | Wt 173.8 lb

## 2023-05-10 DIAGNOSIS — Z0000 Encounter for general adult medical examination without abnormal findings: Secondary | ICD-10-CM | POA: Diagnosis not present

## 2023-05-10 DIAGNOSIS — Z23 Encounter for immunization: Secondary | ICD-10-CM

## 2023-05-10 DIAGNOSIS — Z1231 Encounter for screening mammogram for malignant neoplasm of breast: Secondary | ICD-10-CM | POA: Diagnosis not present

## 2023-05-10 NOTE — Progress Notes (Signed)
 Marland Kitchen

## 2023-05-10 NOTE — Progress Notes (Signed)
 BP 138/85   Pulse 64   Temp 97.9 F (36.6 C) (Oral)   Resp 16   Ht 5' 4.17" (1.63 m)   Wt 173 lb 12.8 oz (78.8 kg)   LMP 07/31/2021 (Approximate) Comment: preg test neg  SpO2 96%   BMI 29.67 kg/m    Subjective:    Patient ID: Candice Robinson, female    DOB: 07-24-1970, 53 y.o.   MRN: [id number]  HPI: Candice Robinson is a 53 y.o. female presenting on 05/10/2023 for comprehensive medical examination. Current medical complaints include:none  Menopausal Symptoms: no  Depression Screen done today and results listed below:     05/10/2023    8:12 AM 05/07/2022    8:37 AM 04/17/2021   11:44 AM 09/27/2020    8:29 AM 04/15/2020    3:46 PM  Depression screen PHQ 2/9  Decreased Interest 0 0 0 0 0  Down, Depressed, Hopeless 0 0 0 0 0  PHQ - 2 Score 0 0 0 0 0  Altered sleeping  0 0  0  Tired, decreased energy  0 0  0  Change in appetite  0 0  0  Feeling bad or failure about yourself   0 0  0  Trouble concentrating  0 0  0  Moving slowly or fidgety/restless  0 0  0  Suicidal thoughts  0 0  0  PHQ-9 Score  0 0  0  Difficult doing work/chores  Not difficult at all       Past Medical History:  Past Medical History:  Diagnosis Date   BRCA negative 01/2020   MyRisk neg   Complication of anesthesia    ponv    Family history of breast cancer 01/2020   IBIS=14.4%/riskscore=17.8%   Kidney stone    PONV (postoperative nausea and vomiting)    Requires phenergan for both procedures    Surgical History:  Past Surgical History:  Procedure Laterality Date   CATARACT EXTRACTION W/PHACO Left 12/17/2021   Procedure: CATARACT EXTRACTION PHACO AND INTRAOCULAR LENS PLACEMENT (IOC) LEFT 8.30 00:56.6;  Surgeon: Lockie Mola, MD;  Location: [facility];  Service: Ophthalmology;  Laterality: Left;   CYSTOSCOPY WITH STENT PLACEMENT Right 12/03/2017   Procedure: CYSTOSCOPY WITH STENT PLACEMENT;  Surgeon: Sondra Come, MD;  Location: [facility];  Service: Urology;  Laterality:  Right;   CYSTOSCOPY/URETEROSCOPY/HOLMIUM LASER/STENT PLACEMENT Right 12/27/2017   Procedure: CYSTOSCOPY/URETEROSCOPY/HOLMIUM LASER/STENT Exchange;  Surgeon: Sondra Come, MD;  Location: [facility];  Service: Urology;  Laterality: Right;  Large renal stone   CYSTOSCOPY/URETEROSCOPY/HOLMIUM LASER/STENT PLACEMENT Right 01/07/2018   Procedure: CYSTOSCOPY/URETEROSCOPY/HOLMIUM LASER/STENT Exchange;  Surgeon: Sondra Come, MD;  Location: [facility];  Service: Urology;  Laterality: Right;  Large renal stone   DENTAL SURGERY     HOLMIUM LASER APPLICATION Right 12/03/2017   Procedure: HOLMIUM LASER APPLICATION;  Surgeon: Sondra Come, MD;  Location: [facility];  Service: Urology;  Laterality: Right;   IR NEPHROSTOMY PLACEMENT RIGHT  12/03/2017   NEPHROLITHOTOMY Right 12/03/2017   Procedure: NEPHROLITHOTOMY PERCUTANEOUS;  Surgeon: Sondra Come, MD;  Location: [facility];  Service: Urology;  Laterality: Right;   TUBAL LIGATION  1996   tummy tuck  2004    Medications:  Current Outpatient Medications on File Prior to Visit  Medication Sig   diphenhydrAMINE (BENADRYL) 25 MG tablet Take 25 mg by mouth every 6 (six) hours as needed.   ibuprofen (ADVIL) 200 MG tablet Take 600 mg by mouth as needed.  ketoconazole (NIZORAL) 2 % cream Apply 1 Application topically 2 (two) times daily.   Magnesium 250 MG TABS Take by mouth daily.   Multiple Vitamins-Minerals (MULTIVITAMIN WOMEN PO) Take 2 tablets by mouth daily. Nature's Bounty   psyllium (REGULOID) 0.52 g capsule Take 2 capsules by mouth 2 (two) times daily.   No current facility-administered medications on file prior to visit.    Allergies:  Allergies  Allergen Reactions   Bactrim [Sulfamethoxazole-Trimethoprim] Shortness Of Breath, Nausea Only and Other (See Comments)    Fatigue   Codeine Other (See Comments)    Hallucinations   Dilaudid [Hydromorphone Hcl] Nausea Only and Other (See Comments)    Extreme sweating   Gluten Meal Other (See  Comments)    Stomach bloating, indigestion     Social History:  Social History   Socioeconomic History   Marital status: Divorced    Spouse name: richard   Number of children: 2   Years of education: Not on file   Highest education level: Not on file  Occupational History   Occupation: Merchandiser, retail  Tobacco Use   Smoking status: Never   Smokeless tobacco: Never  Vaping Use   Vaping status: Never Used  Substance and Sexual Activity   Alcohol use: Yes    Comment: socially. maybe 1 drink every 2 or 3 months   Drug use: No   Sexual activity: Yes    Birth control/protection: Surgical  Other Topics Concern   Not on file  Social History Narrative   Not on file   Social Drivers of Health   Financial Resource Strain: Low Risk  (05/10/2023)   Overall Financial Resource Strain (CARDIA)    Difficulty of Paying Living Expenses: Not hard at all  Food Insecurity: No Food Insecurity (05/10/2023)   Hunger Vital Sign    Worried About Running Out of Food in the Last Year: Never true    Ran Out of Food in the Last Year: Never true  Transportation Needs: No Transportation Needs (05/10/2023)   PRAPARE - Administrator, Civil Service (Medical): No    Lack of Transportation (Non-Medical): No  Physical Activity: Insufficiently Active (05/10/2023)   Exercise Vital Sign    Days of Exercise per Week: 1 day    Minutes of Exercise per Session: 30 min  Stress: No Stress Concern Present (05/10/2023)   [employer] of Occupational Health - Occupational Stress Questionnaire    Feeling of Stress : Not at all  Social Connections: Socially Isolated (05/10/2023)   Social Connection and Isolation Panel [NHANES]    Frequency of Communication with Friends and Family: More than three times a week    Frequency of Social Gatherings with Friends and Family: Once a week    Attends Religious Services: Never    Database administrator or Organizations: No    Attends Banker Meetings:  Never    Marital Status: Divorced  Catering manager Violence: Not At Risk (05/10/2023)   Humiliation, Afraid, Rape, and Kick questionnaire    Fear of Current or Ex-Partner: No    Emotionally Abused: No    Physically Abused: No    Sexually Abused: No   Social History   Tobacco Use  Smoking Status Never  Smokeless Tobacco Never   Social History   Substance and Sexual Activity  Alcohol Use Yes   Comment: socially. maybe 1 drink every 2 or 3 months    Family History:  Family History  Problem Relation Age of Onset  Diabetes Mother    Hyperlipidemia Mother    Hypertension Mother    Hyperlipidemia Father    Hypertension Father    Heart disease Father    Dementia Father    Frontotemporal dementia Father    Lupus Sister    Diabetes Sister    Hypertension Sister    Breast cancer Maternal Aunt        45-60   Breast cancer Maternal Aunt        45-60   Breast cancer Maternal Aunt        45-60   Bladder Cancer Neg Hx    Kidney cancer Neg Hx     Past medical history, surgical history, medications, allergies, family history and social history reviewed with patient today and changes made to appropriate areas of the chart.   Review of Systems  Constitutional: Negative.   HENT: Negative.    Eyes: Negative.   Respiratory: Negative.    Cardiovascular: Negative.   Gastrointestinal: Negative.   Genitourinary: Negative.   Musculoskeletal: Negative.   Skin: Negative.   Neurological: Negative.   Endo/Heme/Allergies: Negative.   Psychiatric/Behavioral: Negative.     All other ROS negative except what is listed above and in the HPI.      Objective:    BP 138/85   Pulse 64   Temp 97.9 F (36.6 C) (Oral)   Resp 16   Ht 5' 4.17" (1.63 m)   Wt 173 lb 12.8 oz (78.8 kg)   LMP 07/31/2021 (Approximate) Comment: preg test neg  SpO2 96%   BMI 29.67 kg/m   Wt Readings from Last 3 Encounters:  05/10/23 173 lb 12.8 oz (78.8 kg)  05/07/22 172 lb 1.6 oz (78.1 kg)  12/17/21 167  lb (75.8 kg)    Physical Exam Vitals and nursing note reviewed.  Constitutional:      General: She is not in acute distress.    Appearance: Normal appearance. She is not ill-appearing, toxic-appearing or diaphoretic.  HENT:     Head: Normocephalic and atraumatic.     Right Ear: Tympanic membrane, ear canal and external ear normal. There is no impacted cerumen.     Left Ear: Tympanic membrane, ear canal and external ear normal. There is no impacted cerumen.     Nose: Nose normal. No congestion or rhinorrhea.     Mouth/Throat:     Mouth: Mucous membranes are moist.     Pharynx: Oropharynx is clear. No oropharyngeal exudate or posterior oropharyngeal erythema.  Eyes:     General: No scleral icterus.       Right eye: No discharge.        Left eye: No discharge.     Extraocular Movements: Extraocular movements intact.     Conjunctiva/sclera: Conjunctivae normal.     Pupils: Pupils are equal, round, and reactive to light.  Neck:     Vascular: No carotid bruit.  Cardiovascular:     Rate and Rhythm: Normal rate and regular rhythm.     Pulses: Normal pulses.     Heart sounds: No murmur heard.    No friction rub. No gallop.  Pulmonary:     Effort: Pulmonary effort is normal. No respiratory distress.     Breath sounds: Normal breath sounds. No stridor. No wheezing, rhonchi or rales.  Chest:     Chest wall: No tenderness.  Abdominal:     General: Abdomen is flat. Bowel sounds are normal. There is no distension.     Palpations: Abdomen is soft.  There is no mass.     Tenderness: There is no abdominal tenderness. There is no right CVA tenderness, left CVA tenderness, guarding or rebound.     Hernia: No hernia is present.  Genitourinary:    Comments: Breast and pelvic exams deferred with shared decision making Musculoskeletal:        General: No swelling, tenderness, deformity or signs of injury.     Cervical back: Normal range of motion and neck supple. No rigidity. No muscular  tenderness.     Right lower leg: No edema.     Left lower leg: No edema.  Lymphadenopathy:     Cervical: No cervical adenopathy.  Skin:    General: Skin is warm and dry.     Capillary Refill: Capillary refill takes less than 2 seconds.     Coloration: Skin is not jaundiced or pale.     Findings: No bruising, erythema, lesion or rash.  Neurological:     General: No focal deficit present.     Mental Status: She is alert and oriented to person, place, and time. Mental status is at baseline.     Cranial Nerves: No cranial nerve deficit.     Sensory: No sensory deficit.     Motor: No weakness.     Coordination: Coordination normal.     Gait: Gait normal.     Deep Tendon Reflexes: Reflexes normal.  Psychiatric:        Mood and Affect: Mood normal.        Behavior: Behavior normal.        Thought Content: Thought content normal.        Judgment: Judgment normal.     Results for orders placed or performed in visit on 05/07/22  Urinalysis, Routine w reflex microscopic   Collection Time: 05/07/22  9:09 AM  Result Value Ref Range   Specific Gravity, UA 1.010 1.005 - 1.030   pH, UA 6.5 5.0 - 7.5   Color, UA Yellow Yellow   Appearance Ur Clear Clear   Leukocytes,UA Negative Negative   Protein,UA Negative Negative/Trace   Glucose, UA Negative Negative   Ketones, UA Negative Negative   RBC, UA Negative Negative   Bilirubin, UA Negative Negative   Urobilinogen, Ur 0.2 0.2 - 1.0 mg/dL   Nitrite, UA Negative Negative   Microscopic Examination Comment   CBC with Differential/Platelet   Collection Time: 05/07/22  9:12 AM  Result Value Ref Range   WBC 7.7 3.4 - 10.8 x10E3/uL   RBC 5.70 (H) 3.77 - 5.28 x10E6/uL   Hemoglobin 15.5 11.1 - 15.9 g/dL   Hematocrit 29.5 (H) 62.1 - 46.6 %   MCV 85 79 - 97 fL   MCH 27.2 26.6 - 33.0 pg   MCHC 31.9 31.5 - 35.7 g/dL   RDW 30.8 65.7 - 84.6 %   Platelets 337 150 - 450 x10E3/uL   Neutrophils 50 Not Estab. %   Lymphs 39 Not Estab. %   Monocytes 9  Not Estab. %   Eos 1 Not Estab. %   Basos 1 Not Estab. %   Neutrophils Absolute 3.9 1.4 - 7.0 x10E3/uL   Lymphocytes Absolute 3.0 0.7 - 3.1 x10E3/uL   Monocytes Absolute 0.7 0.1 - 0.9 x10E3/uL   EOS (ABSOLUTE) 0.1 0.0 - 0.4 x10E3/uL   Basophils Absolute 0.1 0.0 - 0.2 x10E3/uL   Immature Granulocytes 0 Not Estab. %   Immature Grans (Abs) 0.0 0.0 - 0.1 x10E3/uL  Comprehensive metabolic panel   Collection  Time: 05/07/22  9:12 AM  Result Value Ref Range   Glucose 69 (L) 70 - 99 mg/dL   BUN 9 6 - 24 mg/dL   Creatinine, Ser 1.61 0.57 - 1.00 mg/dL   eGFR 70 >09 UE/AVW/0.98   BUN/Creatinine Ratio 9 9 - 23   Sodium 142 134 - 144 mmol/L   Potassium 3.9 3.5 - 5.2 mmol/L   Chloride 100 96 - 106 mmol/L   CO2 24 20 - 29 mmol/L   Calcium 10.2 8.7 - 10.2 mg/dL   Total Protein 7.4 6.0 - 8.5 g/dL   Albumin 4.5 3.8 - 4.9 g/dL   Globulin, Total 2.9 1.5 - 4.5 g/dL   Albumin/Globulin Ratio 1.6 1.2 - 2.2   Bilirubin Total 0.4 0.0 - 1.2 mg/dL   Alkaline Phosphatase 99 44 - 121 IU/L   AST 23 0 - 40 IU/L   ALT 22 0 - 32 IU/L  Lipid Panel w/o Chol/HDL Ratio   Collection Time: 05/07/22  9:12 AM  Result Value Ref Range   Cholesterol, Total 260 (H) 100 - 199 mg/dL   Triglycerides 119 (H) 0 - 149 mg/dL   HDL 46 >14 mg/dL   VLDL Cholesterol Cal 37 5 - 40 mg/dL   LDL Chol Calc (NIH) 782 (H) 0 - 99 mg/dL  TSH   Collection Time: 05/07/22  9:12 AM  Result Value Ref Range   TSH 4.410 0.450 - 4.500 uIU/mL      Assessment & Plan:   Problem List Items Addressed This Visit   None Visit Diagnoses       Routine general medical examination at a health care facility    -  Primary   Vaccines updated. Screening labs checked today. Pap through GYN- due. Mammo ordered. Cologuard up to date. Continue diet and exercise. Call with any concerns.   Relevant Orders   CBC with Differential/Platelet   Comprehensive metabolic panel   Lipid panel   TSH     Encounter for screening mammogram for malignant neoplasm of  breast       Mammo ordered.   Relevant Orders   MM 3D SCREENING MAMMOGRAM BILATERAL BREAST        Follow up plan: Return in about 1 year (around 05/09/2024) for physical.   LABORATORY TESTING:  - Pap smear: done elsewhere- due- will call   IMMUNIZATIONS:   - Tdap: Tetanus vaccination status reviewed: last tetanus booster within 10 years. - Influenza: Refused - Pneumovax: Not applicable - Prevnar: Not applicable - COVID: Refused - HPV: Not applicable - Shingrix vaccine: Administered today  SCREENING: -Mammogram: Ordered today  - Colonoscopy: Up to date   PATIENT COUNSELING:   Advised to take 1 mg of folate supplement per day if capable of pregnancy.   Sexuality: Discussed sexually transmitted diseases, partner selection, use of condoms, avoidance of unintended pregnancy  and contraceptive alternatives.   Advised to avoid cigarette smoking.  I discussed with the patient that most people either abstain from alcohol or drink within safe limits (<=14/week and <=4 drinks/occasion for males, <=7/weeks and <= 3 drinks/occasion for females) and that the risk for alcohol disorders and other health effects rises proportionally with the number of drinks per week and how often a drinker exceeds daily limits.  Discussed cessation/primary prevention of drug use and availability of treatment for abuse.   Diet: Encouraged to adjust caloric intake to maintain  or achieve ideal body weight, to reduce intake of dietary saturated fat and total fat, to limit  sodium intake by avoiding high sodium foods and not adding table salt, and to maintain adequate dietary potassium and calcium preferably from fresh fruits, vegetables, and low-fat dairy products.    stressed the importance of regular exercise  Injury prevention: Discussed safety belts, safety helmets, smoke detector, smoking near bedding or upholstery.   Dental health: Discussed importance of regular tooth brushing, flossing, and dental  visits.    NEXT PREVENTATIVE PHYSICAL DUE IN 1 YEAR. Return in about 1 year (around 05/09/2024) for physical.

## 2023-05-10 NOTE — Patient Instructions (Addendum)
 You have an order for:  []   2D Mammogram  []   3D Mammogram  [x]   Bone Density     Please call for appointment:  [facility]  [address] [place] [address] [phone] [facility] [address] [place], [place] [address] [phone] [facility] [address] Geanie [place], [place] [address] [phone]   Make sure to wear two-piece clothing.  No lotions, powders, or deodorants the day of the appointment. Make sure to bring picture ID and insurance card.  Bring list of medications you are currently taking including any supplements.   Schedule your [place] screening mammogram through MyChart!   Log into your MyChart account.  Go to 'Visit' (or 'Appointments' if on mobile App) --> Schedule an Appointment  Under 'Select a Reason for Visit' choose the Mammogram Screening option.  Complete the pre-visit questions and select the time and place that best fits your schedule.

## 2023-05-11 LAB — COMPREHENSIVE METABOLIC PANEL
ALT: 22 IU/L (ref 0–32)
AST: 21 IU/L (ref 0–40)
Albumin: 4.3 g/dL (ref 3.8–4.9)
Alkaline Phosphatase: 107 IU/L (ref 44–121)
BUN/Creatinine Ratio: 12 (ref 9–23)
BUN: 10 mg/dL (ref 6–24)
Bilirubin Total: 0.3 mg/dL (ref 0.0–1.2)
CO2: 23 mmol/L (ref 20–29)
Calcium: 9.6 mg/dL (ref 8.7–10.2)
Chloride: 102 mmol/L (ref 96–106)
Creatinine, Ser: 0.83 mg/dL (ref 0.57–1.00)
Globulin, Total: 2.8 g/dL (ref 1.5–4.5)
Glucose: 81 mg/dL (ref 70–99)
Potassium: 4.6 mmol/L (ref 3.5–5.2)
Sodium: 139 mmol/L (ref 134–144)
Total Protein: 7.1 g/dL (ref 6.0–8.5)
eGFR: 84 mL/min/{1.73_m2} (ref >59)

## 2023-05-11 LAB — CBC WITH DIFFERENTIAL/PLATELET
Basophils Absolute: 0.1 10*3/uL (ref 0.0–0.2)
Basos: 1 %
EOS (ABSOLUTE): 0.1 10*3/uL (ref 0.0–0.4)
Eos: 2 %
Hematocrit: 47.2 % — ABNORMAL HIGH (ref 34.0–46.6)
Hemoglobin: 15 g/dL (ref 11.1–15.9)
Immature Grans (Abs): 0 10*3/uL (ref 0.0–0.1)
Immature Granulocytes: 0 %
Lymphocytes Absolute: 2.9 10*3/uL (ref 0.7–3.1)
Lymphs: 39 %
MCH: 27.4 pg (ref 26.6–33.0)
MCHC: 31.8 g/dL (ref 31.5–35.7)
MCV: 86 fL (ref 79–97)
Monocytes Absolute: 0.6 10*3/uL (ref 0.1–0.9)
Monocytes: 9 %
Neutrophils Absolute: 3.7 10*3/uL (ref 1.4–7.0)
Neutrophils: 49 %
Platelets: 302 10*3/uL (ref 150–450)
RBC: 5.47 x10E6/uL — ABNORMAL HIGH (ref 3.77–5.28)
RDW: 13.1 % (ref 11.7–15.4)
WBC: 7.5 10*3/uL (ref 3.4–10.8)

## 2023-05-11 LAB — LIPID PANEL
Chol/HDL Ratio: 6.1 ratio — ABNORMAL HIGH (ref 0.0–4.4)
Cholesterol, Total: 232 mg/dL — ABNORMAL HIGH (ref 100–199)
HDL: 38 mg/dL — ABNORMAL LOW (ref >39)
LDL Chol Calc (NIH): 135 mg/dL — ABNORMAL HIGH (ref 0–99)
Triglycerides: 326 mg/dL — ABNORMAL HIGH (ref 0–149)
VLDL Cholesterol Cal: 59 mg/dL — ABNORMAL HIGH (ref 5–40)

## 2023-05-11 LAB — TSH: TSH: 5.14 u[IU]/mL — ABNORMAL HIGH (ref 0.450–4.500)

## 2023-05-18 ENCOUNTER — Encounter: Payer: Self-pay | Admitting: Family Medicine

## 2023-07-21 ENCOUNTER — Encounter: Payer: Self-pay | Admitting: Family Medicine

## 2023-07-22 ENCOUNTER — Ambulatory Visit: Payer: Self-pay

## 2023-07-22 NOTE — Telephone Encounter (Signed)
 Copied from CRM [id number]. Topic: Clinical - Red Word Triage >> Jul 22, 2023  3:36 PM DeAngela L wrote: Red Word that prompted transfer to Nurse Triage: Patient had a dizzy spell that came on all of a sudden and she had no signs before hand, and the biggest part of it lasted 10 minutes and she could not steady back to her office like normal had to take her time walking and after the dizziness stopped she had a headache in the back of her head and medication is not helping and today the headache  is still there now  Chief Complaint: headache consistent after a bout of dizziness yesterday.  Symptoms: pain Frequency: constant since yesterday Pertinent Negatives: Patient denies fever, cold like symptoms, sob Disposition: [] ED /[x] Urgent Care (no appt availability in office) / [] Appointment(In office/virtual)/ []  [place] [facility]/ [] Home Care/ [] Refused Recommended Disposition /[] [place] [facility]/ []  Follow-up with PCP Additional Notes: no apt available for today; instructed to go to the UC for care advice given, denies questions, pcp office updated.    Reason for Disposition  [1] MODERATE headache (e.g., interferes with normal activities) AND [2] present > 24 hours AND [3] unexplained  (Exceptions: analgesics not tried, typical migraine, or headache part of viral illness)  Answer Assessment - Initial Assessment Questions 1. LOCATION: "Where does it hurt?"      Back of head; after bout of dizziness 2. ONSET: "When did the headache start?" (Minutes, hours or days)      yesterday 3. PATTERN: "Does the pain come and go, or has it been constant since it started?"     constant 4. SEVERITY: "How bad is the pain?" and "What does it keep you from doing?"  (e.g., Scale 1-10; mild, moderate, or severe)   - MILD (1-3): doesn't interfere with normal activities    - MODERATE (4-7): interferes with normal activities or awakens from sleep    - SEVERE (8-10): excruciating pain, unable to do any  normal activities        Moderate; states not throbbing 5. RECURRENT SYMPTOM: "Have you ever had headaches before?" If Yes, ask: "When was the last time?" and "What happened that time?"      Yes, back March 6. CAUSE: "What do you think is causing the headache?"     unknown 7. MIGRAINE: "Have you been diagnosed with migraine headaches?" If Yes, ask: "Is this headache similar?"      no 8. HEAD INJURY: "Has there been any recent injury to the head?"      no 9. OTHER SYMPTOMS: "Do you have any other symptoms?" (fever, stiff neck, eye pain, sore throat, cold symptoms)     Shoulder hurts, dizziness yesterday.  10. PREGNANCY: "Is there any chance you are pregnant?" "When was your last menstrual period?"       na  Protocols used: [facility]

## 2023-07-23 NOTE — Telephone Encounter (Signed)
 appt

## 2023-07-27 NOTE — Telephone Encounter (Signed)
 Appt scheduled

## 2023-07-29 ENCOUNTER — Encounter: Payer: Self-pay | Admitting: Family Medicine

## 2023-07-29 ENCOUNTER — Ambulatory Visit: Admitting: Family Medicine

## 2023-07-29 VITALS — BP 157/90 | HR 70 | Ht 64.0 in | Wt 177.8 lb

## 2023-07-29 DIAGNOSIS — R42 Dizziness and giddiness: Secondary | ICD-10-CM | POA: Diagnosis not present

## 2023-07-29 DIAGNOSIS — R030 Elevated blood-pressure reading, without diagnosis of hypertension: Secondary | ICD-10-CM | POA: Diagnosis not present

## 2023-07-29 LAB — MICROALBUMIN, URINE WAIVED
Creatinine, Urine Waived: 100 mg/dL (ref 10–300)
Microalb, Ur Waived: 80 mg/L — ABNORMAL HIGH (ref 0–19)

## 2023-07-29 MED ORDER — FLUTICASONE PROPIONATE 50 MCG/ACT NA SUSP: 2.0000 | Freq: Every day | NASAL | 6 refills | Status: AC

## 2023-07-29 NOTE — Progress Notes (Signed)
 BP (!) 157/90   Pulse 70   Ht 5\' 4"  (1.626 m)   Wt 177 lb 12.8 oz (80.6 kg)   LMP 07/31/2021 (Approximate) Comment: preg test neg  SpO2 97%   BMI 30.52 kg/m    Subjective:    Patient ID: Candice Robinson, female    DOB: Jun 17, 1970, 53 y.o.   MRN: [id number]  HPI: Candice Robinson is a 53 y.o. female  Chief Complaint  Patient presents with   Dizziness    Pt stated that she has been having dizziness on and off since last week accompanied by headaches. Pt states that she has never had dizziness before.    DIZZINESS Duration: first happened at end of March, then happened again last week Description of symptoms: room spinning Duration of episode: at least a couple of hours Dizziness frequency: recurrent x2 Provoking factors: unknown Triggered by rolling over in bed: no Triggered by bending over: yes Aggravated by head movement: no Aggravated by exertion, coughing, loud noises: no Recent head injury: no Recent or current viral symptoms: no History of vasovagal episodes: no Nausea: no Vomiting: no Tinnitus: yes Hearing loss: no Aural fullness: yes Headache: yes- lasting for several days Photophobia/phonophobia: yes Unsteady gait: no Postural instability: no Diplopia, dysarthria, dysphagia or weakness: no Related to exertion: no Pallor: no Diaphoresis: no Dyspnea: no Chest pain: no   Relevant past medical, surgical, family and social history reviewed and updated as indicated. Interim medical history since our last visit reviewed. Allergies and medications reviewed and updated.  Review of Systems  Constitutional: Negative.   HENT:  Positive for tinnitus. Negative for congestion, dental problem, drooling, ear discharge, ear pain, facial swelling, hearing loss, mouth sores, nosebleeds, postnasal drip, rhinorrhea, sinus pressure, sinus pain, sneezing, sore throat, trouble swallowing and voice change.   Respiratory: Negative.    Cardiovascular: Negative.    Musculoskeletal: Negative.   Skin: Negative.   Neurological:  Positive for dizziness and headaches. Negative for tremors, seizures, syncope, facial asymmetry, speech difficulty, weakness, light-headedness and numbness.  Psychiatric/Behavioral: Negative.      Per HPI unless specifically indicated above     Objective:     BP (!) 157/90   Pulse 70   Ht 5\' 4"  (1.626 m)   Wt 177 lb 12.8 oz (80.6 kg)   LMP 07/31/2021 (Approximate) Comment: preg test neg  SpO2 97%   BMI 30.52 kg/m   Wt Readings from Last 3 Encounters:  07/29/23 177 lb 12.8 oz (80.6 kg)  05/10/23 173 lb 12.8 oz (78.8 kg)  05/07/22 172 lb 1.6 oz (78.1 kg)    Physical Exam Vitals and nursing note reviewed.  Constitutional:      General: She is not in acute distress.    Appearance: Normal appearance. She is not ill-appearing, toxic-appearing or diaphoretic.  HENT:     Head: Normocephalic and atraumatic.     Right Ear: Tympanic membrane, ear canal and external ear normal. There is impacted cerumen.     Left Ear: External ear normal. A middle ear effusion is present.     Nose: Nose normal.     Mouth/Throat:     Mouth: Mucous membranes are moist.     Pharynx: Oropharynx is clear.  Eyes:     General: No scleral icterus.       Right eye: No discharge.        Left eye: No discharge.     Extraocular Movements: Extraocular movements intact.     Left eye:  Abnormal extraocular motion present.     Conjunctiva/sclera: Conjunctivae normal.     Pupils: Pupils are equal, round, and reactive to light.  Cardiovascular:     Rate and Rhythm: Normal rate and regular rhythm.     Pulses: Normal pulses.     Heart sounds: Normal heart sounds. No murmur heard.    No friction rub. No gallop.  Pulmonary:     Effort: Pulmonary effort is normal. No respiratory distress.     Breath sounds: Normal breath sounds. No stridor. No wheezing, rhonchi or rales.  Chest:     Chest wall: No tenderness.  Musculoskeletal:        General: Normal  range of motion.     Cervical back: Normal range of motion and neck supple.  Skin:    General: Skin is warm and dry.     Capillary Refill: Capillary refill takes less than 2 seconds.     Coloration: Skin is not jaundiced or pale.     Findings: No bruising, erythema, lesion or rash.  Neurological:     General: No focal deficit present.     Mental Status: She is alert and oriented to person, place, and time. Mental status is at baseline.  Psychiatric:        Mood and Affect: Mood normal.        Behavior: Behavior normal.        Thought Content: Thought content normal.        Judgment: Judgment normal.     Results for orders placed or performed in visit on 05/10/23  CBC with Differential/Platelet   Collection Time: 05/10/23  8:42 AM  Result Value Ref Range   WBC 7.5 3.4 - 10.8 x10E3/uL   RBC 5.47 (H) 3.77 - 5.28 x10E6/uL   Hemoglobin 15.0 11.1 - 15.9 g/dL   Hematocrit 16.1 (H) 09.6 - 46.6 %   MCV 86 79 - 97 fL   MCH 27.4 26.6 - 33.0 pg   MCHC 31.8 31.5 - 35.7 g/dL   RDW 04.5 40.9 - 81.1 %   Platelets 302 150 - 450 x10E3/uL   Neutrophils 49 Not Estab. %   Lymphs 39 Not Estab. %   Monocytes 9 Not Estab. %   Eos 2 Not Estab. %   Basos 1 Not Estab. %   Neutrophils Absolute 3.7 1.4 - 7.0 x10E3/uL   Lymphocytes Absolute 2.9 0.7 - 3.1 x10E3/uL   Monocytes Absolute 0.6 0.1 - 0.9 x10E3/uL   EOS (ABSOLUTE) 0.1 0.0 - 0.4 x10E3/uL   Basophils Absolute 0.1 0.0 - 0.2 x10E3/uL   Immature Granulocytes 0 Not Estab. %   Immature Grans (Abs) 0.0 0.0 - 0.1 x10E3/uL  Comprehensive metabolic panel   Collection Time: 05/10/23  8:42 AM  Result Value Ref Range   Glucose 81 70 - 99 mg/dL   BUN 10 6 - 24 mg/dL   Creatinine, Ser 9.14 0.57 - 1.00 mg/dL   eGFR 84 >78 GN/FAO/1.30   BUN/Creatinine Ratio 12 9 - 23   Sodium 139 134 - 144 mmol/L   Potassium 4.6 3.5 - 5.2 mmol/L   Chloride 102 96 - 106 mmol/L   CO2 23 20 - 29 mmol/L   Calcium 9.6 8.7 - 10.2 mg/dL   Total Protein 7.1 6.0 - 8.5 g/dL    Albumin 4.3 3.8 - 4.9 g/dL   Globulin, Total 2.8 1.5 - 4.5 g/dL   Bilirubin Total 0.3 0.0 - 1.2 mg/dL   Alkaline Phosphatase 107 44 - 121  IU/L   AST 21 0 - 40 IU/L   ALT 22 0 - 32 IU/L  Lipid panel   Collection Time: 05/10/23  8:42 AM  Result Value Ref Range   Cholesterol, Total 232 (H) 100 - 199 mg/dL   Triglycerides 478 (H) 0 - 149 mg/dL   HDL 38 (L) >29 mg/dL   VLDL Cholesterol Cal 59 (H) 5 - 40 mg/dL   LDL Chol Calc (NIH) 562 (H) 0 - 99 mg/dL   Chol/HDL Ratio 6.1 (H) 0.0 - 4.4 ratio  TSH   Collection Time: 05/10/23  8:42 AM  Result Value Ref Range   TSH 5.140 (H) 0.450 - 4.500 uIU/mL      Assessment & Plan:   Problem List Items Addressed This Visit   None Visit Diagnoses       Dizziness    -  Primary   Concern for vertigo. Will treat with flonase and epley's manuvers. Call if not getting better or getting worse. Labs drawn today.   Relevant Orders   TSH   Comprehensive metabolic panel with GFR   CBC with Differential/Platelet   VITAMIN D 25 Hydroxy (Vit-D Deficiency, Fractures)     Elevated systolic blood pressure reading without diagnosis of hypertension       Will work on DASH and recheck 1 month. Checking labs. Call with any concerns.   Relevant Orders   Microalbumin, Urine Waived        Follow up plan: Return in about 4 weeks (around 08/26/2023).

## 2023-07-30 LAB — COMPREHENSIVE METABOLIC PANEL WITH GFR
ALT: 23 IU/L (ref 0–32)
AST: 23 IU/L (ref 0–40)
Albumin: 4.5 g/dL (ref 3.8–4.9)
Alkaline Phosphatase: 101 IU/L (ref 44–121)
BUN/Creatinine Ratio: 13 (ref 9–23)
BUN: 12 mg/dL (ref 6–24)
Bilirubin Total: 0.4 mg/dL (ref 0.0–1.2)
CO2: 23 mmol/L (ref 20–29)
Calcium: 9.6 mg/dL (ref 8.7–10.2)
Chloride: 103 mmol/L (ref 96–106)
Creatinine, Ser: 0.96 mg/dL (ref 0.57–1.00)
Globulin, Total: 2.7 g/dL (ref 1.5–4.5)
Glucose: 83 mg/dL (ref 70–99)
Potassium: 4.2 mmol/L (ref 3.5–5.2)
Sodium: 141 mmol/L (ref 134–144)
Total Protein: 7.2 g/dL (ref 6.0–8.5)
eGFR: 71 mL/min/{1.73_m2} (ref >59)

## 2023-07-30 LAB — CBC WITH DIFFERENTIAL/PLATELET
Basophils Absolute: 0.1 10*3/uL (ref 0.0–0.2)
Basos: 1 %
EOS (ABSOLUTE): 0.1 10*3/uL (ref 0.0–0.4)
Eos: 2 %
Hematocrit: 46 % (ref 34.0–46.6)
Hemoglobin: 14.3 g/dL (ref 11.1–15.9)
Immature Grans (Abs): 0 10*3/uL (ref 0.0–0.1)
Immature Granulocytes: 0 %
Lymphocytes Absolute: 2.9 10*3/uL (ref 0.7–3.1)
Lymphs: 38 %
MCH: 26.7 pg (ref 26.6–33.0)
MCHC: 31.1 g/dL — ABNORMAL LOW (ref 31.5–35.7)
MCV: 86 fL (ref 79–97)
Monocytes Absolute: 0.7 10*3/uL (ref 0.1–0.9)
Monocytes: 9 %
Neutrophils Absolute: 3.8 10*3/uL (ref 1.4–7.0)
Neutrophils: 50 %
Platelets: 304 10*3/uL (ref 150–450)
RBC: 5.36 x10E6/uL — ABNORMAL HIGH (ref 3.77–5.28)
RDW: 12.7 % (ref 11.7–15.4)
WBC: 7.7 10*3/uL (ref 3.4–10.8)

## 2023-07-30 LAB — VITAMIN D 25 HYDROXY (VIT D DEFICIENCY, FRACTURES): Vit D, 25-Hydroxy: 30.4 ng/mL (ref 30.0–100.0)

## 2023-07-30 LAB — TSH: TSH: 4.76 u[IU]/mL — ABNORMAL HIGH (ref 0.450–4.500)

## 2023-08-09 ENCOUNTER — Ambulatory Visit: Payer: Self-pay | Admitting: Family Medicine

## 2023-08-26 ENCOUNTER — Ambulatory Visit: Admitting: Family Medicine

## 2023-11-02 ENCOUNTER — Encounter: Payer: Self-pay | Admitting: Family Medicine

## 2023-11-02 ENCOUNTER — Ambulatory Visit: Admitting: Family Medicine

## 2023-11-02 VITALS — BP 148/94 | HR 72 | Temp 98.0°F | Ht 64.0 in | Wt 177.4 lb

## 2023-11-02 DIAGNOSIS — Z23 Encounter for immunization: Secondary | ICD-10-CM

## 2023-11-02 DIAGNOSIS — I10 Essential (primary) hypertension: Secondary | ICD-10-CM

## 2023-11-02 LAB — MICROALBUMIN, URINE WAIVED
Creatinine, Urine Waived: 100 mg/dL (ref 10–300)
Microalb, Ur Waived: 30 mg/L — ABNORMAL HIGH (ref 0–19)
Microalb/Creat Ratio: <30 mg/g (ref <30)

## 2023-11-02 NOTE — Assessment & Plan Note (Signed)
 Would like to avoid medication. Microalbumin normal. Will work on [employer] and weight loss and recheck in 6-8 weeks. Call with any concerns.

## 2023-11-02 NOTE — Progress Notes (Signed)
 BP (!) 148/94   Pulse 72   Temp 98 F (36.7 C) (Oral)   Ht 5' 4 (1.626 m)   Wt 177 lb 6.4 oz (80.5 kg)   LMP 07/31/2021 (Approximate) Comment: preg test neg  SpO2 98%   BMI 30.45 kg/m    Subjective:    Patient ID: Candice Robinson, female    DOB: 24-May-1970, 53 y.o.   MRN: [id number]  HPI: Candice Robinson is a 53 y.o. female  Chief Complaint  Patient presents with   Hypertension   Dizziness    Has completely resolved    HYPERTENSION  Hypertension status: stable  Satisfied with current treatment? yes Duration of hypertension: months BP monitoring frequency:  occasionally BP range: around 150s BP medication side effects:  N/A Previous BP meds: none Aspirin: no Recurrent headaches: yes Visual changes: no Palpitations: no Dyspnea: no Chest pain: no Lower extremity edema: no Dizzy/lightheaded: no  Relevant past medical, surgical, family and social history reviewed and updated as indicated. Interim medical history since our last visit reviewed. Allergies and medications reviewed and updated.  Review of Systems  Constitutional: Negative.   Respiratory: Negative.    Cardiovascular: Negative.   Musculoskeletal: Negative.   Psychiatric/Behavioral: Negative.      Per HPI unless specifically indicated above     Objective:    BP (!) 148/94   Pulse 72   Temp 98 F (36.7 C) (Oral)   Ht 5' 4 (1.626 m)   Wt 177 lb 6.4 oz (80.5 kg)   LMP 07/31/2021 (Approximate) Comment: preg test neg  SpO2 98%   BMI 30.45 kg/m   Wt Readings from Last 3 Encounters:  11/02/23 177 lb 6.4 oz (80.5 kg)  07/29/23 177 lb 12.8 oz (80.6 kg)  05/10/23 173 lb 12.8 oz (78.8 kg)    Physical Exam Vitals and nursing note reviewed.  Constitutional:      General: She is not in acute distress.    Appearance: Normal appearance. She is not ill-appearing, toxic-appearing or diaphoretic.  HENT:     Head: Normocephalic and atraumatic.     Right Ear: External ear normal.     Left Ear:  External ear normal.     Nose: Nose normal.     Mouth/Throat:     Mouth: Mucous membranes are moist.     Pharynx: Oropharynx is clear.  Eyes:     General: No scleral icterus.       Right eye: No discharge.        Left eye: No discharge.     Extraocular Movements: Extraocular movements intact.     Conjunctiva/sclera: Conjunctivae normal.     Pupils: Pupils are equal, round, and reactive to light.  Cardiovascular:     Rate and Rhythm: Normal rate and regular rhythm.     Pulses: Normal pulses.     Heart sounds: Normal heart sounds. No murmur heard.    No friction rub. No gallop.  Pulmonary:     Effort: Pulmonary effort is normal. No respiratory distress.     Breath sounds: Normal breath sounds. No stridor. No wheezing, rhonchi or rales.  Chest:     Chest wall: No tenderness.  Musculoskeletal:        General: Normal range of motion.     Cervical back: Normal range of motion and neck supple.  Skin:    General: Skin is warm and dry.     Capillary Refill: Capillary refill takes less than 2 seconds.  Coloration: Skin is not jaundiced or pale.     Findings: No bruising, erythema, lesion or rash.  Neurological:     General: No focal deficit present.     Mental Status: She is alert and oriented to person, place, and time. Mental status is at baseline.  Psychiatric:        Mood and Affect: Mood normal.        Behavior: Behavior normal.        Thought Content: Thought content normal.        Judgment: Judgment normal.     Results for orders placed or performed in visit on 07/29/23  Microalbumin, Urine Waived   Collection Time: 07/29/23  1:50 PM  Result Value Ref Range   Microalb, Ur Waived 80 (H) 0 - 19 mg/L   Creatinine, Urine Waived 100 10 - 300 mg/dL   Microalb/Creat Ratio 30-300 (H) <30 mg/g  TSH   Collection Time: 07/29/23  1:51 PM  Result Value Ref Range   TSH 4.760 (H) 0.450 - 4.500 uIU/mL  Comprehensive metabolic panel with GFR   Collection Time: 07/29/23  1:51 PM   Result Value Ref Range   Glucose 83 70 - 99 mg/dL   BUN 12 6 - 24 mg/dL   Creatinine, Ser 9.03 0.57 - 1.00 mg/dL   eGFR 71 >40 fO/fpw/8.26   BUN/Creatinine Ratio 13 9 - 23   Sodium 141 134 - 144 mmol/L   Potassium 4.2 3.5 - 5.2 mmol/L   Chloride 103 96 - 106 mmol/L   CO2 23 20 - 29 mmol/L   Calcium 9.6 8.7 - 10.2 mg/dL   Total Protein 7.2 6.0 - 8.5 g/dL   Albumin 4.5 3.8 - 4.9 g/dL   Globulin, Total 2.7 1.5 - 4.5 g/dL   Bilirubin Total 0.4 0.0 - 1.2 mg/dL   Alkaline Phosphatase 101 44 - 121 IU/L   AST 23 0 - 40 IU/L   ALT 23 0 - 32 IU/L  CBC with Differential/Platelet   Collection Time: 07/29/23  1:51 PM  Result Value Ref Range   WBC 7.7 3.4 - 10.8 x10E3/uL   RBC 5.36 (H) 3.77 - 5.28 x10E6/uL   Hemoglobin 14.3 11.1 - 15.9 g/dL   Hematocrit 53.9 65.9 - 46.6 %   MCV 86 79 - 97 fL   MCH 26.7 26.6 - 33.0 pg   MCHC 31.1 (L) 31.5 - 35.7 g/dL   RDW 87.2 88.2 - 84.5 %   Platelets 304 150 - 450 x10E3/uL   Neutrophils 50 Not Estab. %   Lymphs 38 Not Estab. %   Monocytes 9 Not Estab. %   Eos 2 Not Estab. %   Basos 1 Not Estab. %   Neutrophils Absolute 3.8 1.4 - 7.0 x10E3/uL   Lymphocytes Absolute 2.9 0.7 - 3.1 x10E3/uL   Monocytes Absolute 0.7 0.1 - 0.9 x10E3/uL   EOS (ABSOLUTE) 0.1 0.0 - 0.4 x10E3/uL   Basophils Absolute 0.1 0.0 - 0.2 x10E3/uL   Immature Granulocytes 0 Not Estab. %   Immature Grans (Abs) 0.0 0.0 - 0.1 x10E3/uL  VITAMIN D  25 Hydroxy (Vit-D Deficiency, Fractures)   Collection Time: 07/29/23  1:51 PM  Result Value Ref Range   Vit D, 25-Hydroxy 30.4 30.0 - 100.0 ng/mL      Assessment & Plan:   Problem List Items Addressed This Visit       Cardiovascular and Mediastinum   Primary hypertension - Primary   Would like to avoid medication. Microalbumin normal. Will  work on [employer] and weight loss and recheck in 6-8 weeks. Call with any concerns.       Relevant Orders   Microalbumin, Urine Waived     Follow up plan: Return in about 2 months (around  01/02/2024).

## 2023-12-14 ENCOUNTER — Encounter: Payer: Self-pay | Admitting: Family Medicine

## 2023-12-14 ENCOUNTER — Ambulatory Visit: Admitting: Family Medicine

## 2023-12-14 VITALS — BP 138/88 | HR 66 | Temp 98.0°F | Ht 64.0 in | Wt 167.6 lb

## 2023-12-14 DIAGNOSIS — I10 Essential (primary) hypertension: Secondary | ICD-10-CM | POA: Diagnosis not present

## 2023-12-14 NOTE — Progress Notes (Signed)
 BP 138/88   Pulse 66   Temp 98 F (36.7 C) (Oral)   Ht 5' 4 (1.626 m)   Wt 167 lb 9.6 oz (76 kg)   LMP 07/31/2021 (Approximate) Comment: preg test neg  SpO2 97%   BMI 28.77 kg/m    Subjective:    Patient ID: Candice Robinson, female    DOB: 23-Nov-1970, 53 y.o.   MRN: [id number]  HPI: Candice Robinson is a 53 y.o. female  Chief Complaint  Patient presents with   Hypertension    BP @ home this 130/92  Avg 120's/135 over  85/90   Weight Loss    Working on changing diet. Removing sodium   HYPERTENSION  Hypertension status: better  Satisfied with current treatment? yes Duration of hypertension: chronic BP monitoring frequency:  a few times a month BP range: 120s-130s/80s BP medication side effects:  no Medication compliance: not on anything Previous BP meds: not on anything Aspirin: no Recurrent headaches: no Visual changes: no Palpitations: no Dyspnea: no Chest pain: no Lower extremity edema: no Dizzy/lightheaded: no  Relevant past medical, surgical, family and social history reviewed and updated as indicated. Interim medical history since our last visit reviewed. Allergies and medications reviewed and updated.  Review of Systems  Constitutional: Negative.   Respiratory: Negative.    Cardiovascular: Negative.   Musculoskeletal: Negative.   Neurological: Negative.   Psychiatric/Behavioral: Negative.      Per HPI unless specifically indicated above     Objective:    BP 138/88   Pulse 66   Temp 98 F (36.7 C) (Oral)   Ht 5' 4 (1.626 m)   Wt 167 lb 9.6 oz (76 kg)   LMP 07/31/2021 (Approximate) Comment: preg test neg  SpO2 97%   BMI 28.77 kg/m   Wt Readings from Last 3 Encounters:  12/14/23 167 lb 9.6 oz (76 kg)  11/02/23 177 lb 6.4 oz (80.5 kg)  07/29/23 177 lb 12.8 oz (80.6 kg)    Physical Exam Vitals and nursing note reviewed.  Constitutional:      General: She is not in acute distress.    Appearance: Normal appearance. She is not  ill-appearing, toxic-appearing or diaphoretic.  HENT:     Head: Normocephalic and atraumatic.     Right Ear: External ear normal.     Left Ear: External ear normal.     Nose: Nose normal.     Mouth/Throat:     Mouth: Mucous membranes are moist.     Pharynx: Oropharynx is clear.  Eyes:     General: No scleral icterus.       Right eye: No discharge.        Left eye: No discharge.     Extraocular Movements: Extraocular movements intact.     Conjunctiva/sclera: Conjunctivae normal.     Pupils: Pupils are equal, round, and reactive to light.  Cardiovascular:     Rate and Rhythm: Normal rate and regular rhythm.     Pulses: Normal pulses.     Heart sounds: Normal heart sounds. No murmur heard.    No friction rub. No gallop.  Pulmonary:     Effort: Pulmonary effort is normal. No respiratory distress.     Breath sounds: Normal breath sounds. No stridor. No wheezing, rhonchi or rales.  Chest:     Chest wall: No tenderness.  Musculoskeletal:        General: Normal range of motion.     Cervical back: Normal range of motion  and neck supple.  Skin:    General: Skin is warm and dry.     Capillary Refill: Capillary refill takes less than 2 seconds.     Coloration: Skin is not jaundiced or pale.     Findings: No bruising, erythema, lesion or rash.  Neurological:     General: No focal deficit present.     Mental Status: She is alert and oriented to person, place, and time. Mental status is at baseline.  Psychiatric:        Mood and Affect: Mood normal.        Behavior: Behavior normal.        Thought Content: Thought content normal.        Judgment: Judgment normal.     Results for orders placed or performed in visit on 11/02/23  Microalbumin, Urine Waived   Collection Time: 11/02/23  9:15 AM  Result Value Ref Range   Microalb, Ur Waived 30 (H) 0 - 19 mg/L   Creatinine, Urine Waived 100 10 - 300 mg/dL   Microalb/Creat Ratio <30 <30 mg/g      Assessment & Plan:   Problem List  Items Addressed This Visit       Cardiovascular and Mediastinum   Primary hypertension - Primary   Does not want to start medicine. Congratulated patient on weight loss and dietary changes. Continue to monitor. Call with any concerns.         Follow up plan: Return in about 5 months (around 05/13/2024) for physical.

## 2023-12-14 NOTE — Assessment & Plan Note (Signed)
 Does not want to start medicine. Congratulated patient on weight loss and dietary changes. Continue to monitor. Call with any concerns.

## 2023-12-28 ENCOUNTER — Encounter: Payer: Self-pay | Admitting: Family Medicine

## 2023-12-28 ENCOUNTER — Ambulatory Visit: Admitting: Family Medicine

## 2023-12-28 VITALS — BP 136/92 | HR 70 | Temp 98.1°F | Ht 64.0 in | Wt 166.8 lb

## 2023-12-28 DIAGNOSIS — M898X8 Other specified disorders of bone, other site: Secondary | ICD-10-CM | POA: Diagnosis not present

## 2023-12-28 NOTE — Progress Notes (Signed)
 BP (!) 136/92   Pulse 70   Temp 98.1 F (36.7 C) (Oral)   Ht 5' 4 (1.626 m)   Wt 166 lb 12.8 oz (75.7 kg)   LMP 07/31/2021 (Approximate) Comment: preg test neg  SpO2 97%   BMI 28.63 kg/m    Subjective:    Patient ID: Candice Robinson, female    DOB: 10/22/1970, 53 y.o.   MRN: [id number]  HPI: Candice Robinson is a 53 y.o. female  Chief Complaint  Patient presents with   Mass    Pt complains of lump underneath breast plate in the middle. Tightness and discomfort. Denies pain. Noticed last Friday. Tender to touch.    LUMP Duration: Friday Location: middle of chest under breast bone Onset: sudden Painful: yes Discomfort: yes Status:  not changing Trauma: no Redness: no Bruising: no Recent infection: no Swollen lymph nodes: no Requesting removal: no History of cancer: no Family history of cancer: no History of the same: no Associated signs and symptoms: none  Relevant past medical, surgical, family and social history reviewed and updated as indicated. Interim medical history since our last visit reviewed. Allergies and medications reviewed and updated.  Review of Systems  Constitutional: Negative.   Respiratory: Negative.    Cardiovascular: Negative.   Musculoskeletal: Negative.   Skin: Negative.   Neurological: Negative.   Psychiatric/Behavioral: Negative.      Per HPI unless specifically indicated above     Objective:    BP (!) 136/92   Pulse 70   Temp 98.1 F (36.7 C) (Oral)   Ht 5' 4 (1.626 m)   Wt 166 lb 12.8 oz (75.7 kg)   LMP 07/31/2021 (Approximate) Comment: preg test neg  SpO2 97%   BMI 28.63 kg/m   Wt Readings from Last 3 Encounters:  12/28/23 166 lb 12.8 oz (75.7 kg)  12/14/23 167 lb 9.6 oz (76 kg)  11/02/23 177 lb 6.4 oz (80.5 kg)    Physical Exam Vitals and nursing note reviewed.  Constitutional:      General: She is not in acute distress.    Appearance: Normal appearance. She is well-developed.  HENT:     Head:  Normocephalic and atraumatic.     Right Ear: Hearing and external ear normal.     Left Ear: Hearing and external ear normal.     Nose: Nose normal.     Mouth/Throat:     Mouth: Mucous membranes are moist.     Pharynx: Oropharynx is clear.  Eyes:     General: Lids are normal. No scleral icterus.       Right eye: No discharge.        Left eye: No discharge.     Conjunctiva/sclera: Conjunctivae normal.  Pulmonary:     Effort: Pulmonary effort is normal. No respiratory distress.  Chest:    Musculoskeletal:        General: Normal range of motion.  Skin:    General: Skin is warm.     Capillary Refill: Capillary refill takes less than 2 seconds.     Coloration: Skin is not jaundiced or pale.     Findings: No bruising, erythema, lesion or rash.  Neurological:     General: No focal deficit present.     Mental Status: She is alert and oriented to person, place, and time. Mental status is at baseline.  Psychiatric:        Mood and Affect: Mood normal.        Speech:  Speech normal.        Behavior: Behavior normal.        Thought Content: Thought content normal.        Judgment: Judgment normal.     Results for orders placed or performed in visit on 11/02/23  Microalbumin, Urine Waived   Collection Time: 11/02/23  9:15 AM  Result Value Ref Range   Microalb, Ur Waived 30 (H) 0 - 19 mg/L   Creatinine, Urine Waived 100 10 - 300 mg/dL   Microalb/Creat Ratio <30 <30 mg/g      Assessment & Plan:   Problem List Items Addressed This Visit   None Visit Diagnoses       Sternal mass    -  Primary   Concern for ganglion cyst- will send her for US . Await results. Treat as needed.   Relevant Orders   US  CHEST SOFT TISSUE        Follow up plan: Return for As scheduled.

## 2023-12-29 ENCOUNTER — Other Ambulatory Visit: Payer: Self-pay

## 2023-12-29 DIAGNOSIS — Z1231 Encounter for screening mammogram for malignant neoplasm of breast: Secondary | ICD-10-CM

## 2023-12-30 ENCOUNTER — Ambulatory Visit
Admission: RE | Admit: 2023-12-30 | Discharge: 2023-12-30 | Disposition: A | Discharge status: Home / Self Care | Source: Ambulatory Visit | Attending: Family Medicine | Admitting: Family Medicine

## 2023-12-30 DIAGNOSIS — Z1231 Encounter for screening mammogram for malignant neoplasm of breast: Secondary | ICD-10-CM

## 2024-01-04 ENCOUNTER — Ambulatory Visit
Admission: RE | Admit: 2024-01-04 | Discharge: 2024-01-04 | Disposition: A | Discharge status: Home / Self Care | Source: Ambulatory Visit | Attending: Family Medicine | Admitting: Family Medicine

## 2024-01-04 DIAGNOSIS — M898X8 Other specified disorders of bone, other site: Secondary | ICD-10-CM | POA: Insufficient documentation

## 2024-01-07 ENCOUNTER — Telehealth: Payer: Self-pay | Admitting: Family Medicine

## 2024-01-07 NOTE — Telephone Encounter (Signed)
 Copied from CRM [id number]. Topic: Clinical - Lab/Test Results >> Jan 07, 2024 11:07 AM Delon DASEN wrote: Reason for CRM: patient calling for imaging results, please call [phone]

## 2024-01-11 ENCOUNTER — Other Ambulatory Visit: Payer: Self-pay | Admitting: Family Medicine

## 2024-01-11 ENCOUNTER — Ambulatory Visit: Payer: Self-pay | Admitting: Family Medicine

## 2024-01-11 ENCOUNTER — Encounter: Payer: Self-pay | Admitting: Family Medicine

## 2024-01-11 DIAGNOSIS — M898X8 Other specified disorders of bone, other site: Secondary | ICD-10-CM

## 2024-01-17 ENCOUNTER — Telehealth: Payer: Self-pay

## 2024-01-17 ENCOUNTER — Other Ambulatory Visit: Payer: Self-pay | Admitting: Family Medicine

## 2024-01-17 DIAGNOSIS — M898X8 Other specified disorders of bone, other site: Secondary | ICD-10-CM

## 2024-01-17 NOTE — Telephone Encounter (Signed)
 Spoke with PCP this morning regarding consultation referral.  Unfortunately I do not believe orthopedics would be able to appropriately treat her xiphoid process concern.  PCP will redirect referral.

## 2024-01-19 ENCOUNTER — Ambulatory Visit

## 2024-05-16 ENCOUNTER — Encounter: Admitting: Family Medicine
